# Patient Record
Sex: Male | Born: 1940
Health system: Southern US, Community
[De-identification: ages and names within clinical notes are randomized; demographics above are authoritative.]

## PROBLEM LIST (undated history)

## (undated) DIAGNOSIS — Z87442 Personal history of urinary calculi: Secondary | ICD-10-CM

## (undated) DIAGNOSIS — N200 Calculus of kidney: Secondary | ICD-10-CM

## (undated) DIAGNOSIS — J64 Unspecified pneumoconiosis: Secondary | ICD-10-CM

## (undated) DIAGNOSIS — I2510 Atherosclerotic heart disease of native coronary artery without angina pectoris: Secondary | ICD-10-CM

## (undated) DIAGNOSIS — K219 Gastro-esophageal reflux disease without esophagitis: Secondary | ICD-10-CM

## (undated) DIAGNOSIS — I10 Essential (primary) hypertension: Secondary | ICD-10-CM

## (undated) DIAGNOSIS — D699 Hemorrhagic condition, unspecified: Secondary | ICD-10-CM

## (undated) DIAGNOSIS — G4730 Sleep apnea, unspecified: Secondary | ICD-10-CM

## (undated) DIAGNOSIS — J189 Pneumonia, unspecified organism: Secondary | ICD-10-CM

## (undated) DIAGNOSIS — J449 Chronic obstructive pulmonary disease, unspecified: Secondary | ICD-10-CM

## (undated) DIAGNOSIS — E785 Hyperlipidemia, unspecified: Secondary | ICD-10-CM

## (undated) DIAGNOSIS — M1990 Unspecified osteoarthritis, unspecified site: Secondary | ICD-10-CM

## (undated) HISTORY — PX: CARDIAC CATHETERIZATION: SHX172

## (undated) HISTORY — PX: CYSTOSCOPY W/ STONE MANIPULATION: SHX1427

## (undated) HISTORY — PX: FRACTURE SURGERY: SHX138

## (undated) HISTORY — DX: Chronic obstructive pulmonary disease, unspecified: J44.9

## (undated) HISTORY — DX: Gastro-esophageal reflux disease without esophagitis: K21.9

---

## 1997-02-06 HISTORY — PX: NASAL SEPTUM SURGERY: SHX37

## 2004-02-07 HISTORY — PX: CORONARY ANGIOPLASTY WITH STENT PLACEMENT: SHX49

## 2004-04-27 DIAGNOSIS — I2510 Atherosclerotic heart disease of native coronary artery without angina pectoris: Secondary | ICD-10-CM | POA: Insufficient documentation

## 2004-04-29 ENCOUNTER — Ambulatory Visit: Payer: Self-pay | Admitting: Internal Medicine

## 2004-05-02 ENCOUNTER — Ambulatory Visit: Payer: Self-pay | Admitting: Cardiology

## 2004-05-06 ENCOUNTER — Inpatient Hospital Stay (HOSPITAL_BASED_OUTPATIENT_CLINIC_OR_DEPARTMENT_OTHER): Admission: RE | Admit: 2004-05-06 | Discharge: 2004-05-06 | Payer: Self-pay | Admitting: Cardiology

## 2004-05-06 ENCOUNTER — Ambulatory Visit (HOSPITAL_COMMUNITY): Admission: RE | Admit: 2004-05-06 | Discharge: 2004-05-07 | Payer: Self-pay | Admitting: Cardiology

## 2004-05-06 ENCOUNTER — Ambulatory Visit: Payer: Self-pay | Admitting: Cardiology

## 2004-05-13 ENCOUNTER — Ambulatory Visit: Payer: Self-pay | Admitting: Cardiology

## 2004-06-02 ENCOUNTER — Ambulatory Visit: Payer: Self-pay | Admitting: Cardiology

## 2004-09-02 ENCOUNTER — Ambulatory Visit: Payer: Self-pay | Admitting: Cardiology

## 2004-10-21 ENCOUNTER — Ambulatory Visit: Payer: Self-pay | Admitting: Cardiology

## 2004-11-03 ENCOUNTER — Ambulatory Visit: Payer: Self-pay | Admitting: Cardiology

## 2005-01-23 ENCOUNTER — Ambulatory Visit: Payer: Self-pay | Admitting: Cardiology

## 2005-04-17 ENCOUNTER — Ambulatory Visit: Payer: Self-pay | Admitting: Cardiology

## 2005-06-19 ENCOUNTER — Ambulatory Visit: Payer: Self-pay | Admitting: Cardiology

## 2007-02-07 HISTORY — PX: ANKLE FRACTURE SURGERY: SHX122

## 2010-12-02 DIAGNOSIS — R072 Precordial pain: Secondary | ICD-10-CM

## 2011-04-03 ENCOUNTER — Telehealth: Payer: Self-pay | Admitting: *Deleted

## 2011-04-03 NOTE — Telephone Encounter (Signed)
Opened in ERROR

## 2011-05-26 DIAGNOSIS — N400 Enlarged prostate without lower urinary tract symptoms: Secondary | ICD-10-CM | POA: Insufficient documentation

## 2011-05-26 DIAGNOSIS — I240 Acute coronary thrombosis not resulting in myocardial infarction: Secondary | ICD-10-CM | POA: Insufficient documentation

## 2012-01-24 DIAGNOSIS — J40 Bronchitis, not specified as acute or chronic: Secondary | ICD-10-CM | POA: Insufficient documentation

## 2012-10-17 DIAGNOSIS — D649 Anemia, unspecified: Secondary | ICD-10-CM | POA: Insufficient documentation

## 2014-04-07 ENCOUNTER — Encounter: Payer: Self-pay | Admitting: *Deleted

## 2014-04-07 ENCOUNTER — Ambulatory Visit (INDEPENDENT_AMBULATORY_CARE_PROVIDER_SITE_OTHER): Payer: PPO | Admitting: Cardiology

## 2014-04-07 ENCOUNTER — Encounter: Payer: Self-pay | Admitting: Cardiology

## 2014-04-07 ENCOUNTER — Telehealth: Payer: Self-pay | Admitting: Cardiology

## 2014-04-07 VITALS — BP 116/72 | HR 67 | Ht 69.0 in | Wt 174.8 lb

## 2014-04-07 DIAGNOSIS — R42 Dizziness and giddiness: Secondary | ICD-10-CM

## 2014-04-07 DIAGNOSIS — R030 Elevated blood-pressure reading, without diagnosis of hypertension: Secondary | ICD-10-CM

## 2014-04-07 DIAGNOSIS — I2510 Atherosclerotic heart disease of native coronary artery without angina pectoris: Secondary | ICD-10-CM

## 2014-04-07 DIAGNOSIS — R0789 Other chest pain: Secondary | ICD-10-CM

## 2014-04-07 MED ORDER — NITROGLYCERIN 0.4 MG SL SUBL: 0.4000 mg | SUBLINGUAL_TABLET | SUBLINGUAL | Status: DC | PRN

## 2014-04-07 MED ORDER — RANOLAZINE ER 500 MG PO TB12: 500.0000 mg | ORAL_TABLET | Freq: Two times a day (BID) | ORAL | Status: DC

## 2014-04-07 MED ORDER — LISINOPRIL 2.5 MG PO TABS: 2.5000 mg | ORAL_TABLET | Freq: Every day | ORAL | Status: DC

## 2014-04-07 NOTE — Progress Notes (Signed)
Clinical Summary Eric Benitez is a 74 y.o.maleseen today as a new patient fo the following medical problems.  1. CAD - reports stent placed at [facility] 10 years, do not see records in current electronic record. Reports at the time was having chest and neck pain and ended up having a cath and receiving a stent. No repeat procedures since that time.   - recent chest pain started approx 3 weeks ago. Tightness throughout entire chest, 6/10. +SOB. Can feel dizzy. Can occur at rest, but worst with activity. Not positonal. Can last 3-4 hours consistently. Occurs daily, with recent increase in frequency and some increase in severity. DOE at 1/2 block which is new. Notes some occasional LE edema. Can have some occasional orthopnea. Also with neck pain he states similar to pain he was having 10 years ago prior to stent. . - pcp ordered exercise stress due to chest pain at [facility], he reports he was stopped by technician at the 2 min 40 sec mark when he reached his THR, no ischemic changes.    2. Dizziness - mainly occurs when on his feet - can have palpitations associated.  - bottles of water x 3-4. 2 cups of coffee in AM.  - notes some low bp's at times.     PMH 1. HL 2. BPH 3. HTN 4. CAD    No past medical history on file.   No Known Allergies   Current Outpatient Prescriptions  Medication Sig Dispense Refill  . Ascorbic Acid (VITAMIN C) 1000 MG tablet Take 1,000 mg by mouth daily.    Marland Kitchen aspirin 81 MG tablet Take 81 mg by mouth daily.    Marland Kitchen atorvastatin (LIPITOR) 80 MG tablet Take 80 mg by mouth daily.    . cholecalciferol (VITAMIN D) 1000 UNITS tablet Take 1,000 Units by mouth daily.    Marland Kitchen doxazosin (CARDURA) 8 MG tablet Take 8 mg by mouth daily.    . finasteride (PROSCAR) 5 MG tablet Take 5 mg by mouth daily.    . metoprolol succinate (TOPROL-XL) 25 MG 24 hr tablet Take 25 mg by mouth daily.    . Omega-3 Fatty Acids (FISH OIL PO) Take 1 capsule by mouth daily.    . vitamin E  400 UNIT capsule Take 400 Units by mouth daily.    Marland Kitchen lisinopril (PRINIVIL,ZESTRIL) 2.5 MG tablet Take 1 tablet (2.5 mg total) by mouth daily. 90 tablet 3  . nitroGLYCERIN (NITROSTAT) 0.4 MG SL tablet Place 1 tablet (0.4 mg total) under the tongue every 5 (five) minutes as needed for chest pain. 90 tablet 3  . ranolazine (RANEXA) 500 MG 12 hr tablet Take 1 tablet (500 mg total) by mouth 2 (two) times daily. 60 tablet 3   No current facility-administered medications for this visit.     No past surgical history on file.   No Known Allergies    No family history on file.   Social History Mr. Colgate reports that he quit smoking about 35 years ago. He does not have any smokeless tobacco history on file. Mr. Helmers has no alcohol history on file.   Review of Systems CONSTITUTIONAL: No weight loss, fever, chills, weakness or fatigue.  HEENT: Eyes: No visual loss, blurred vision, double vision or yellow sclerae.No hearing loss, sneezing, congestion, runny nose or sore throat.  SKIN: No rash or itching.  CARDIOVASCULAR: per HPI RESPIRATORY: No shortness of breath, cough or sputum.  GASTROINTESTINAL: No anorexia, nausea, vomiting or diarrhea.  No abdominal pain or blood.  GENITOURINARY: No burning on urination, no polyuria NEUROLOGICAL: No headache, dizziness, syncope, paralysis, ataxia, numbness or tingling in the extremities. No change in bowel or bladder control.  MUSCULOSKELETAL:neck pain  LYMPHATICS: No enlarged nodes. No history of splenectomy.  PSYCHIATRIC: No history of depression or anxiety.  ENDOCRINOLOGIC: No reports of sweating, cold or heat intolerance. No polyuria or polydipsia.  Marland Kitchen   Physical Examination Filed Vitals:   04/07/14 1323  BP: 116/72  Pulse: 67   Filed Weights   04/07/14 1323  Weight: 174 lb 12.8 oz (79.289 kg)    Gen: resting comfortably, no acute distress HEENT: no scleral icterus, pupils equal round and reactive, no palptable cervical adenopathy,    CV: RRR, no m/r/g, no JVD Resp: Clear to auscultation bilaterally GI: abdomen is soft, non-tender, non-distended, normal bowel sounds, no hepatosplenomegaly MSK: extremities are warm, no edema.  Skin: warm, no rash Neuro:  no focal deficits Psych: appropriate affect    Assessment and Plan  1. CAD - hx of stent 10 years ago,  recent episodes of chest pain concerning for possible ischemia. - given known history and description of symptoms high pretest probability for obstructive CAD that would not be sufficiently decreased with non-invasive testing. Will refer for LHC.  - due to orthostatic symptoms, start ranexa 500mg  bid as additional antianginal  2. Dizziness - symptoms consistent with orthostatic dizziness and note borderline low bp's at home, however he is not orthostatic in clinic today.  - will decrease lisinopril to 2.5mg  daily, encouraged increased oral fluid intake.    F/u 3-4 weeks.   Arnoldo Lenis, M.D.

## 2014-04-07 NOTE — Telephone Encounter (Signed)
Patient has [employer] -checking per cert for catheterization set for March 4th with Dr. Ellyn Hack

## 2014-04-07 NOTE — Patient Instructions (Addendum)
Your physician recommends that you schedule a follow-up appointment in: 3-4 WEEKS WITH DR. BRANCH  Your physician has recommended you make the following change in your medication:   DECREASE LISINOPRIL 2.5 MG DAILY  START NITROGLYCERIN AS DIRECTED AS NEEDED  START RANEXA 500 MG TWICE DAILY  Your physician has requested that you have a cardiac catheterization. Cardiac catheterization is used to diagnose and/or treat various heart conditions. Doctors may recommend this procedure for a number of different reasons. The most common reason is to evaluate chest pain. Chest pain can be a symptom of coronary artery disease (CAD), and cardiac catheterization can show whether plaque is narrowing or blocking your heart's arteries. This procedure is also used to evaluate the valves, as well as measure the blood flow and oxygen levels in different parts of your heart. For further information please visit [email or web address]. Please follow instruction sheet, as given.   WE WILL REQUEST LABS FROM DR. DANIEL  Thank you for choosing [facility]!!

## 2014-04-08 ENCOUNTER — Other Ambulatory Visit: Payer: Self-pay | Admitting: Cardiology

## 2014-04-08 DIAGNOSIS — R0789 Other chest pain: Secondary | ICD-10-CM

## 2014-04-09 ENCOUNTER — Encounter (HOSPITAL_COMMUNITY): Payer: Self-pay | Admitting: *Deleted

## 2014-04-09 ENCOUNTER — Telehealth: Payer: Self-pay | Admitting: *Deleted

## 2014-04-09 ENCOUNTER — Inpatient Hospital Stay (HOSPITAL_COMMUNITY)
Admission: EM | Admit: 2014-04-09 | Discharge: 2014-04-10 | DRG: 287 | Disposition: A | Discharge status: Home / Self Care | Payer: PPO | Attending: Cardiology | Admitting: Cardiology

## 2014-04-09 ENCOUNTER — Encounter (HOSPITAL_COMMUNITY)
Admission: EM | Disposition: A | Discharge status: Home / Self Care | Payer: Self-pay | Source: Home / Self Care | Attending: Cardiology

## 2014-04-09 ENCOUNTER — Emergency Department (HOSPITAL_COMMUNITY): Payer: PPO

## 2014-04-09 ENCOUNTER — Other Ambulatory Visit: Payer: Self-pay

## 2014-04-09 DIAGNOSIS — E785 Hyperlipidemia, unspecified: Secondary | ICD-10-CM | POA: Diagnosis present

## 2014-04-09 DIAGNOSIS — Z79899 Other long term (current) drug therapy: Secondary | ICD-10-CM | POA: Diagnosis not present

## 2014-04-09 DIAGNOSIS — Z7982 Long term (current) use of aspirin: Secondary | ICD-10-CM

## 2014-04-09 DIAGNOSIS — I25110 Atherosclerotic heart disease of native coronary artery with unstable angina pectoris: Principal | ICD-10-CM | POA: Diagnosis present

## 2014-04-09 DIAGNOSIS — Z87891 Personal history of nicotine dependence: Secondary | ICD-10-CM

## 2014-04-09 DIAGNOSIS — Z9861 Coronary angioplasty status: Secondary | ICD-10-CM

## 2014-04-09 DIAGNOSIS — R55 Syncope and collapse: Secondary | ICD-10-CM | POA: Diagnosis present

## 2014-04-09 DIAGNOSIS — Z955 Presence of coronary angioplasty implant and graft: Secondary | ICD-10-CM | POA: Diagnosis not present

## 2014-04-09 DIAGNOSIS — I10 Essential (primary) hypertension: Secondary | ICD-10-CM | POA: Diagnosis present

## 2014-04-09 DIAGNOSIS — I200 Unstable angina: Secondary | ICD-10-CM | POA: Diagnosis present

## 2014-04-09 DIAGNOSIS — I2510 Atherosclerotic heart disease of native coronary artery without angina pectoris: Secondary | ICD-10-CM

## 2014-04-09 DIAGNOSIS — R079 Chest pain, unspecified: Secondary | ICD-10-CM | POA: Diagnosis present

## 2014-04-09 HISTORY — PX: LEFT HEART CATHETERIZATION WITH CORONARY ANGIOGRAM: SHX5451

## 2014-04-09 HISTORY — DX: Pneumonia, unspecified organism: J18.9

## 2014-04-09 HISTORY — DX: Calculus of kidney: N20.0

## 2014-04-09 HISTORY — DX: Unspecified osteoarthritis, unspecified site: M19.90

## 2014-04-09 HISTORY — DX: Essential (primary) hypertension: I10

## 2014-04-09 HISTORY — DX: Hyperlipidemia, unspecified: E78.5

## 2014-04-09 HISTORY — DX: Atherosclerotic heart disease of native coronary artery without angina pectoris: I25.10

## 2014-04-09 HISTORY — DX: Hemorrhagic condition, unspecified: D69.9

## 2014-04-09 LAB — BASIC METABOLIC PANEL
Anion gap: 6 (ref 5–15)
BUN: 9 mg/dL (ref 6–23)
CHLORIDE: 104 mmol/L (ref 96–112)
CO2: 26 mmol/L (ref 19–32)
Calcium: 9.1 mg/dL (ref 8.4–10.5)
Creatinine, Ser: 1.07 mg/dL (ref 0.50–1.35)
GFR calc non Af Amer: 67 mL/min — ABNORMAL LOW (ref >90)
GFR, EST AFRICAN AMERICAN: 77 mL/min — AB (ref >90)
GLUCOSE: 105 mg/dL — AB (ref 70–99)
Potassium: 4.5 mmol/L (ref 3.5–5.1)
SODIUM: 136 mmol/L (ref 135–145)

## 2014-04-09 LAB — I-STAT TROPONIN, ED
TROPONIN I, POC: 0 ng/mL (ref 0.00–0.08)
Troponin i, poc: 0 ng/mL (ref 0.00–0.08)

## 2014-04-09 LAB — CBC
HCT: 36.5 % — ABNORMAL LOW (ref 39.0–52.0)
Hemoglobin: 12.2 g/dL — ABNORMAL LOW (ref 13.0–17.0)
MCH: 31.3 pg (ref 26.0–34.0)
MCHC: 33.4 g/dL (ref 30.0–36.0)
MCV: 93.6 fL (ref 78.0–100.0)
Platelets: 170 10*3/uL (ref 150–400)
RBC: 3.9 MIL/uL — AB (ref 4.22–5.81)
RDW: 13.8 % (ref 11.5–15.5)
WBC: 5.3 10*3/uL (ref 4.0–10.5)

## 2014-04-09 LAB — APTT: APTT: 27 s (ref 24–37)

## 2014-04-09 LAB — PROTIME-INR
INR: 1.07 (ref 0.00–1.49)
Prothrombin Time: 14 seconds (ref 11.6–15.2)

## 2014-04-09 SURGERY — LEFT HEART CATHETERIZATION WITH CORONARY ANGIOGRAM

## 2014-04-09 MED ORDER — MORPHINE SULFATE 4 MG/ML IJ SOLN
4.0000 mg | Freq: Once | INTRAMUSCULAR | Status: AC
  Administered 2014-04-09: 4 mg via INTRAVENOUS
  Filled 2014-04-09: qty 1

## 2014-04-09 MED ORDER — LIDOCAINE HCL (PF) 1 % IJ SOLN
INTRAMUSCULAR | Status: AC
Start: 2014-04-09 — End: 2014-04-09
  Filled 2014-04-09: qty 30

## 2014-04-09 MED ORDER — FINASTERIDE 5 MG PO TABS
5.0000 mg | ORAL_TABLET | Freq: Every day | ORAL | Status: DC
Start: 2014-04-10 — End: 2014-04-10
  Administered 2014-04-10: 5 mg via ORAL
  Filled 2014-04-09: qty 1

## 2014-04-09 MED ORDER — ASPIRIN EC 81 MG PO TBEC
81.0000 mg | DELAYED_RELEASE_TABLET | Freq: Every day | ORAL | Status: DC
  Administered 2014-04-10: 81 mg via ORAL
  Filled 2014-04-09: qty 1

## 2014-04-09 MED ORDER — HEPARIN (PORCINE) IN NACL 2-0.9 UNIT/ML-% IJ SOLN
INTRAMUSCULAR | Status: AC
  Filled 2014-04-09: qty 1500

## 2014-04-09 MED ORDER — LISINOPRIL 2.5 MG PO TABS
2.5000 mg | ORAL_TABLET | Freq: Every day | ORAL | Status: DC
Start: 2014-04-10 — End: 2014-04-10
  Administered 2014-04-10: 2.5 mg via ORAL
  Filled 2014-04-09: qty 1

## 2014-04-09 MED ORDER — SODIUM CHLORIDE 0.9 % IV SOLN
1.0000 mL/kg/h | INTRAVENOUS | Status: AC
  Administered 2014-04-09: 1 mL/kg/h via INTRAVENOUS

## 2014-04-09 MED ORDER — SODIUM CHLORIDE 0.9 % IJ SOLN: 3.0000 mL | INTRAMUSCULAR | Status: DC | PRN

## 2014-04-09 MED ORDER — ONDANSETRON HCL 4 MG/2ML IJ SOLN: 4.0000 mg | Freq: Four times a day (QID) | INTRAMUSCULAR | Status: DC | PRN

## 2014-04-09 MED ORDER — NITROGLYCERIN 1 MG/10 ML FOR IR/CATH LAB
INTRA_ARTERIAL | Status: AC
  Filled 2014-04-09: qty 10

## 2014-04-09 MED ORDER — HEPARIN BOLUS VIA INFUSION
4000.0000 [IU] | Freq: Once | INTRAVENOUS | Status: AC
  Administered 2014-04-09: 4000 [IU] via INTRAVENOUS
  Filled 2014-04-09: qty 4000

## 2014-04-09 MED ORDER — MIDAZOLAM HCL 2 MG/2ML IJ SOLN
INTRAMUSCULAR | Status: AC
Start: 2014-04-09 — End: 2014-04-09
  Filled 2014-04-09: qty 2

## 2014-04-09 MED ORDER — HEPARIN (PORCINE) IN NACL 100-0.45 UNIT/ML-% IJ SOLN
1100.0000 [IU]/h | INTRAMUSCULAR | Status: DC
  Administered 2014-04-10: 1100 [IU]/h via INTRAVENOUS
  Filled 2014-04-09: qty 250

## 2014-04-09 MED ORDER — VITAMIN E 180 MG (400 UNIT) PO CAPS
400.0000 [IU] | ORAL_CAPSULE | Freq: Every day | ORAL | Status: DC
  Administered 2014-04-10: 400 [IU] via ORAL
  Filled 2014-04-09: qty 1

## 2014-04-09 MED ORDER — HEPARIN (PORCINE) IN NACL 100-0.45 UNIT/ML-% IJ SOLN
1100.0000 [IU]/h | INTRAMUSCULAR | Status: DC
  Administered 2014-04-09: 1100 [IU]/h via INTRAVENOUS
  Filled 2014-04-09: qty 250

## 2014-04-09 MED ORDER — VITAMIN D 1000 UNITS PO TABS
1000.0000 [IU] | ORAL_TABLET | Freq: Every day | ORAL | Status: DC
Start: 2014-04-10 — End: 2014-04-10
  Administered 2014-04-10: 1000 [IU] via ORAL
  Filled 2014-04-09: qty 1

## 2014-04-09 MED ORDER — SODIUM CHLORIDE 0.9 % IV SOLN: 250.0000 mL | INTRAVENOUS | Status: DC | PRN

## 2014-04-09 MED ORDER — FENTANYL CITRATE 0.05 MG/ML IJ SOLN
INTRAMUSCULAR | Status: AC
  Filled 2014-04-09: qty 2

## 2014-04-09 MED ORDER — ACETAMINOPHEN 325 MG PO TABS: 650.0000 mg | ORAL_TABLET | ORAL | Status: DC | PRN

## 2014-04-09 MED ORDER — SODIUM CHLORIDE 0.9 % IJ SOLN
3.0000 mL | Freq: Two times a day (BID) | INTRAMUSCULAR | Status: DC
  Administered 2014-04-10: 3 mL via INTRAVENOUS

## 2014-04-09 MED ORDER — DOXAZOSIN MESYLATE 8 MG PO TABS
8.0000 mg | ORAL_TABLET | Freq: Every day | ORAL | Status: DC
  Administered 2014-04-10: 8 mg via ORAL
  Filled 2014-04-09 (×2): qty 1

## 2014-04-09 MED ORDER — NITROGLYCERIN 0.4 MG SL SUBL: 0.4000 mg | SUBLINGUAL_TABLET | SUBLINGUAL | Status: DC | PRN

## 2014-04-09 MED ORDER — ATORVASTATIN CALCIUM 80 MG PO TABS
80.0000 mg | ORAL_TABLET | Freq: Every day | ORAL | Status: DC
  Administered 2014-04-09 – 2014-04-10 (×2): 80 mg via ORAL
  Filled 2014-04-09 (×2): qty 1

## 2014-04-09 MED ORDER — RANOLAZINE ER 500 MG PO TB12
500.0000 mg | ORAL_TABLET | Freq: Two times a day (BID) | ORAL | Status: DC
  Administered 2014-04-09 – 2014-04-10 (×2): 500 mg via ORAL
  Filled 2014-04-09 (×2): qty 1

## 2014-04-09 MED ORDER — ASPIRIN 81 MG PO CHEW
324.0000 mg | CHEWABLE_TABLET | Freq: Once | ORAL | Status: AC
  Administered 2014-04-09: 324 mg via ORAL
  Filled 2014-04-09: qty 4

## 2014-04-09 MED ORDER — NITROGLYCERIN 2 % TD OINT
1.0000 [in_us] | TOPICAL_OINTMENT | Freq: Once | TRANSDERMAL | Status: AC
  Administered 2014-04-09: 1 [in_us] via TOPICAL
  Filled 2014-04-09: qty 1

## 2014-04-09 MED ORDER — ASPIRIN 81 MG PO TABS: 81.0000 mg | ORAL_TABLET | Freq: Every day | ORAL | Status: DC

## 2014-04-09 MED ORDER — METOPROLOL SUCCINATE ER 25 MG PO TB24
25.0000 mg | ORAL_TABLET | Freq: Every day | ORAL | Status: DC
  Administered 2014-04-10: 25 mg via ORAL
  Filled 2014-04-09: qty 1

## 2014-04-09 MED ORDER — SODIUM CHLORIDE 0.9 % IV SOLN
1000.0000 mL | INTRAVENOUS | Status: DC
  Administered 2014-04-09: 1000 mL via INTRAVENOUS

## 2014-04-09 NOTE — Consult Note (Signed)
CARDIOLOGY CONSULT NOTE  Patient ID: Eric Benitez, MRN: [id number], DOB/AGE: Mar 05, 1940 74 y.o. Admit date: 04/09/2014 Date of Consult: 04/09/2014  Primary Physician: Gar Ponto, MD Primary Cardiologist: Not followed currently  Referring Physician: ED Physician   Chief Complaint: Chest pain  Reason for Consultation: Chest pain   HPI:  Eric Benitez is a 74yo man with PMHx of HTN, hyperlipidemia, and CAD s/p stent in 2006 who presents to the ED with worsening chest pain. Patient reports approximately 3 weeks ago he started to have intermittent chest pain while working in the yard and walking. He states the pain would resolve with nitro and/or rest. He then notes the chest pain has been worsening over the last few days. He describes the chest pain as left-sided, 8/10 in severity, non-radiating, tight and pressure-like, and associated with dyspnea and palpitations. He denies associated diaphoresis and nausea/vomiting. He states his most recent episode of chest pain was this morning while he was sitting down and after he drank 2 cups of coffee. He notes the pain improved after 2 nitro, but did not resolve as usual. He reports he had a stress test last Thursday at [facility] that was "normal." He also notes this Tuesday he was working in the yard when he suddenly became dizzy, "swimmy-headed", short of breath, and felt he was going to pass out. He denies losing consciousness. He denies previous history of stroke/TIA. He denies changes in vision, weakness, changes in sensation, or slurred speech.   Medical History:  Past Medical History  Diagnosis Date  . CAD (coronary artery disease)   . Hyperlipemia   . Hypertension       Surgical History:  Past Surgical History  Procedure Laterality Date  . Coronary angioplasty with stent placement    . Kidney stone surgery       Home Meds: Prior to Admission medications   Medication Sig Start Date End Date Taking? Authorizing Provider    Ascorbic Acid (VITAMIN C) 1000 MG tablet Take 1,000 mg by mouth daily.   Yes Historical Provider, MD  aspirin 81 MG tablet Take 81 mg by mouth daily.   Yes Historical Provider, MD  atorvastatin (LIPITOR) 80 MG tablet Take 80 mg by mouth daily.   Yes Historical Provider, MD  cholecalciferol (VITAMIN D) 1000 UNITS tablet Take 1,000 Units by mouth daily.   Yes Historical Provider, MD  doxazosin (CARDURA) 8 MG tablet Take 8 mg by mouth daily.   Yes Historical Provider, MD  finasteride (PROSCAR) 5 MG tablet Take 5 mg by mouth daily.   Yes Historical Provider, MD  lisinopril (PRINIVIL,ZESTRIL) 2.5 MG tablet Take 1 tablet (2.5 mg total) by mouth daily. 04/07/14  Yes Arnoldo Lenis, MD  metoprolol succinate (TOPROL-XL) 25 MG 24 hr tablet Take 25 mg by mouth daily.   Yes Historical Provider, MD  nitroGLYCERIN (NITROSTAT) 0.4 MG SL tablet Place 1 tablet (0.4 mg total) under the tongue every 5 (five) minutes as needed for chest pain. 04/07/14  Yes Arnoldo Lenis, MD  Omega-3 Fatty Acids (FISH OIL PO) Take 1 capsule by mouth daily.   Yes Historical Provider, MD  ranolazine (RANEXA) 500 MG 12 hr tablet Take 1 tablet (500 mg total) by mouth 2 (two) times daily. 04/07/14  Yes Arnoldo Lenis, MD  vitamin E 400 UNIT capsule Take 400 Units by mouth daily.   Yes Historical Provider, MD    Inpatient Medications:    . sodium chloride 1,000 mL (04/09/14 1057)  Allergies: No Known Allergies  History   Social History  . Marital Status: Married    Spouse Name: N/A  . Number of Children: N/A  . Years of Education: N/A   Occupational History  . Not on file.   Social History Main Topics  . Smoking status: Former Smoker    Quit date: 02/07/1979  . Smokeless tobacco: Not on file  . Alcohol Use: No  . Drug Use: No  . Sexual Activity: Not on file   Other Topics Concern  . Not on file   Social History Narrative     No family history on file.   Review of Systems: General: negative for chills,  fever, night sweats or weight changes.  ENT: negative for rhinorrhea or epistaxis Cardiovascular: See HPI Dermatological: negative for rash Respiratory: negative for cough or wheezing GI: negative for diarrhea, bright red blood per rectum, melena, or hematemesis GU: no hematuria, urgency, or frequency Neurologic: See HPI Heme: no easy bruising or bleeding Endo: negative for excessive thirst, thyroid disorder, or flushing Musculoskeletal: negative for joint pain or swelling, negative for myalgias  All other systems reviewed and are otherwise negative except as noted above.  Physical Exam: Blood pressure 105/66, pulse 56, temperature 97.9 F (36.6 C), resp. rate 14, height 5\' 9"  (1.753 m), weight 174 lb (78.926 kg), SpO2 96 %. General: alert, sitting up in bed, NAD HEENT: [place]/AT, EOMI, mucus membranes moist CV: RRR,  No m/g/r Pulm: CTA bilterally, breaths non-labored Abd: BS+, soft, non-tender Ext: warm, no edema, moves all Neuro: alert and oriented x 3, no focal deficits     Labs: No results for input(s): CKTOTAL, CKMB, TROPONINI in the last 72 hours. Lab Results  Component Value Date   WBC 5.3 04/09/2014   HGB 12.2* 04/09/2014   HCT 36.5* 04/09/2014   MCV 93.6 04/09/2014   PLT 170 04/09/2014    Recent Labs Lab 04/09/14 1028  NA 136  K 4.5  CL 104  CO2 26  BUN 9  CREATININE 1.07  CALCIUM 9.1  GLUCOSE 105*   No results found for: CHOL, HDL, LDLCALC, TRIG No results found for: DDIMER  Radiology/Studies:  Dg Chest Port 1 View  04/09/2014   CLINICAL DATA:  Three-week history of difficulty breathing and chest pain  EXAM: PORTABLE CHEST - 1 VIEW  COMPARISON:  March 24, 2014  FINDINGS: There is underlying emphysematous change. There are a few small scattered granulomas. There is no edema or consolidation. Heart is upper normal in size with pulmonary vascularity within normal limits. No adenopathy. No bone lesions.  IMPRESSION: Underlying emphysematous change. No edema or  consolidation. Scattered small granulomas.   Electronically Signed   By: Lowella Grip III M.D.   On: 04/09/2014 10:36    EKG:  Sinus rhythm, no evidence of ischemia   Cardiac Studies: None  ASSESSMENT AND PLAN:   Unstable Angina: Patient presented with 3 week history of exertional chest pain that was relieved with nitro and rest consistent with stable angina and then had sudden chest pain at rest this morning concerning for unstable angina. He does have a history of CAD with prior stenting. His TIMI score is 3-4. Patient had a stress test last Thursday that was reportedly normal, but he was only able to complete a few minutes of the test due to dyspnea. He was scheduled to undergo cardiac cath tomorrow. Troponin negative x 1 and EKG without acute ischemic changes. Given the acuity of his pain and that it  is occurring at rest, will proceed with cardiac catheterization this afternoon.  - Start heparin gtt - Cardiac cath this afternoon  - Cardiac monitoring   Signed, Albin Felling MD 04/09/2014, 1:42 PM   Patient seen, examined. Available data reviewed. Agree with findings, assessment, and plan as outlined by Dr Arcelia Jew. Pt's exam shows an alert, oriented male in NAD. Lungs CTA, CV RRR without murmur, extremities without edema. EKG shows no acute changes. Pt was scheduled for cardiac cath tomorrow, but now has accelerating angina and rest pain. He has been started on NTG and IV heparin. Plan for urgent cardiac cath and possible PCI.   I have reviewed the risks, indications, and alternatives to cardiac catheterization and PTCA/stetning with the patient. Risks include but are not limited to bleeding, infection, vascular injury, stroke, myocardial infection, arrhythmia, kidney injury, radiation-related injury in the case of prolonged fluoroscopy use, emergency cardiac surgery, and death. The patient understands the risks of serious complication is low (<4%).   Sherren Mocha, M.D. 04/09/2014 2:56  PM

## 2014-04-09 NOTE — Progress Notes (Signed)
PHARMACY NOTE  Pharmacy Consult :  74 y.o. male s/p Cardiac Cath.  Heparin is to be restarted due to possibility of his chest pain resulting from a PE.   Dosing Wt :  71 kg  Hematology :  04/09/14 1028  HGB 12.2*  HCT 36.5*  PLT 170  APTT 27  LABPROT 14.0  INR 1.07  CREATININE 1.07    Current Medication[s] Include: Scheduled:  Scheduled:  . [START ON 04/10/2014] aspirin EC  81 mg Oral Daily  . atorvastatin  80 mg Oral Daily  . [START ON 04/10/2014] cholecalciferol  1,000 Units Oral Daily  . doxazosin  8 mg Oral Daily  . [START ON 04/10/2014] finasteride  5 mg Oral Daily  . [START ON 04/10/2014] lisinopril  2.5 mg Oral Daily  . metoprolol succinate  25 mg Oral Daily  . ranolazine  500 mg Oral BID  . [START ON 04/10/2014] sodium chloride  3 mL Intravenous Q12H  . [START ON 04/10/2014] vitamin E  400 Units Oral Daily   Infusion[s]: Infusions:  . sodium chloride 1,000 mL (04/09/14 1057)  . Heparin to be restarted 6 hrs after TR Band removal 1,100 Units/hr      Assessment :  S/P Cardiac Cath.  Cath not significant for CAD as per note.  Heparin to be restarted due to concern for PE.  Goal :  Heparin level 0.3-0.7 units/ml.  Plan : 1. Heparin will be restarted at same rate, 1100 units/hr six hours after TR band removal [8 hrs after Sheath removal]..   The next Heparin Level 8 hours after restart. 2. Daily Heparin level, CBC while on Heparin.  Monitor for bleeding complications. Follow Platelet counts.  Kendrix Orman, Craig Guess,  Pharm.D  04/09/2014  7:30 PM

## 2014-04-09 NOTE — Consult Note (Signed)
ANTICOAGULATION CONSULT NOTE - Initial Consult  Pharmacy Consult for Heparin Indication: chest pain/ACS  No Known Allergies  Patient Measurements: Height: 5\' 9"  (175.3 cm) Weight: 174 lb (78.926 kg) IBW/kg (Calculated) : 70.7  Vital Signs: Temp: 97.9 F (36.6 C) (03/03 1013) BP: 116/68 mmHg (03/03 1415) Pulse Rate: 55 (03/03 1415)  Labs:  Recent Labs  04/09/14 1028  HGB 12.2*  HCT 36.5*  PLT 170  APTT 27  LABPROT 14.0  INR 1.07  CREATININE 1.07    Estimated Creatinine Clearance: 61.5 mL/min (by C-G formula based on Cr of 1.07).   Medical History: Past Medical History  Diagnosis Date  . CAD (coronary artery disease)   . Hyperlipemia   . Hypertension     Medications:  No anticoagulants pta  Assessment: Eric Benitez with hx CAD s/p stent in 2006 presents to the ED with worsening chest pain over the last few days. He will begin heparin with plans for cath tomorrow. Baseline labs wnl.   Goal of Therapy:  Heparin level 0.3-0.7 units/ml Monitor platelets by anticoagulation protocol: Yes   Plan:  1) Heparin bolus 4000 units x 1 2) Heparin drip at 1100 units/hr 3) Check 8 hour heparin level 4) Daily heparin level and CBC  Deboraha Sprang 04/09/2014,2:44 PM

## 2014-04-09 NOTE — ED Notes (Signed)
Pts belongings given to wife at bedside, clothing, socks and gold watch.

## 2014-04-09 NOTE — ED Notes (Signed)
Patient started ranexa 500mg  bid 2 days ago

## 2014-04-09 NOTE — CV Procedure (Signed)
 CARDIAC CATHETERIZATION REPORT  NAME:  Eric Benitez   MRN: [id number] DOB:  11/03/1940   ADMIT DATE: 04/09/2014 Procedure Date: 04/09/2014  INTERVENTIONAL CARDIOLOGIST:  W , M.D., MS PRIMARY CARE PROVIDER: DANIEL, TERRY, MD PRIMARY CARDIOLOGIST: Jonathan Branch, M.D.  PATIENT:  Eric Benitez is a 73 y.o. male with a history of single-vessel CAD status post PCI in March 2006 (he took Plavix for long period time, son and presuming that it was likely a DES stent). At that time his anginal pain was no chest pain. Over the last 3 weeks she's noticed recent onset chest pain and wrote tightness. The tightness also spans across the entire chest and 6 at 10. He also notes dyspnea. He can last 34 hours and is not positional. Has increased with intensity. He also notes exertional dyspnea walking about half a block which is also new.  He apparently had a stress test done last week at [facility] that was "normal ". He was initially planned to undergo cardiac catheterization tomorrow on 04/11/2014, however he presented emergency room at [facility] with dizziness and near syncope type symptoms. He also had significant chest pain this morning after drinking 2 cups of coffee. It did not resolve, and therefore he came to the emergency room. He was seen by Dr. Cooper who was concerned based on Dr. branches initial assessment earlier this week clinic visit that these symptoms were consistent with unstable angina. He is therefore referred for invasive evaluation and urgent matter based on his early presentation.  PRE-OPERATIVE DIAGNOSIS:    Unstable Angina  Known CAD status post PCI to the mid LAD with DES in March 2006  PROCEDURES PERFORMED:    Left Heart Catheterization with Native Coronary Angiography  via Right Radial Artery   Left Ventriculography  PROCEDURE: The patient was brought to the 2nd Floor [facility] in the fasting state  and prepped and draped in the usual sterile fashion for Right Radial artery access. A modified Allen's test was performed on the right wrist demonstrating excellent collateral flow for radial access.   Sterile technique was used including antiseptics, cap, gloves, gown, hand hygiene, mask and sheet. Skin prep: Chlorhexidine.   Consent: Risks of procedure as well as the alternatives and risks of each were explained to the (patient/caregiver). Consent for procedure obtained.   Time Out: Verified patient identification, verified procedure, site/side was marked, verified correct patient position, special equipment/implants available, medications/allergies/relevent history reviewed, required imaging and test results available. Performed.  Access:   Right Radial Artery: 6 Fr Sheath -  Seldinger Technique (Angiocath Micropuncture Kit)  Radial Cocktail - 10 mL;  the patient had just received 4000 units IV Heparin in the ER  Left Heart Catheterization: 5 Fr Catheters advanced or exchanged over a long exchange safety J-wire; TIG 4.0 catheter advanced first.  Left & Right Coronary Artery Cineangiography: TIG 4.0 Catheter   LV Hemodynamics (LV Gram): Angled Pigtail Catheter  Sheath removed in the Cardiac Cath Lab with TR band placement for hemostasis.  TR Band: 1835  Hours; 15 mL air  FINDINGS:  Hemodynamics:   Central Aortic Pressure / Mean: 126/67/92 mmHg  Left Ventricular Pressure / LVEDP: 128/10/16 mmHg  Left Ventriculography:  EF: 50-65 %  Wall Motion: Normal to hyperdynamic  Coronary Anatomy:  Dominance: Co-dominant  Left Main: Large-caliber vessel that is short. Bifurcates into the LAD and Circumflex. Angiographically normal. LAD: Large-caliber vessel with a very large proximal D1 branch that comes   off just prior to a widely patent stent in the early mid vessel. There is a small second diagonal branch that arises from the stented segment and another small third diagonal branch more  distally. The distal vessel tapers into a moderate caliber vessel that wraps around the apex perfusing the distal third of the inferoapex.  D1: Large-caliber proximal branch with ostial 40-50% focal stenosis. The vessel then courses almost of the ramus intermedius. The vessel is very tortuous gives rise to several mold-moderate caliber branches distally. Beyond the ostial lesion, the vessel remains angiographically normal.  Left Circumflex: Moderate large-caliber, codominant vessel. There is a small moderate caliber first obtuse marginal branch before the vessel courses into the AV groove. In the mid AV groove there is a hairpin turn as the vessel courses distally to provide to posterior lateral branches with the first being moderate caliber and second mean small caliber.  OM1: Small moderate caliber branch that does not cover large distribution. Tortuous but free of disease.   RCA: Moderate large-caliber, codominant vessel. Minimal luminal irregularities are vessel tapers down to [place] more moderate caliber right posterior descending arteries. There is a very small posterior lateral system.  After reviewing the initial angiography, no culprit lesion was identified.    MEDICATIONS:  Anesthesia:  Local Lidocaine 2 ml  Sedation:  1 mg IV Versed, 50 mcg IV fentanyl ;   Premedication: 4000 Units IV Heparin, 325 mg ASA  Omnipaque Contrast: 80 ml Radial Cocktail: 5 mg Verapamil, 400 mcg NTG, 2 ml 2% Lidocaine in 10 ml NS  PATIENT DISPOSITION:    The patient was transferred to the PACU holding area in a hemodynamicaly stable, chest pain free condition.  The patient tolerated the procedure well, and there were no complications.  EBL:   < 5 ml  The patient was stable before, during, and after the procedure.  POST-OPERATIVE DIAGNOSIS:    Essentially Angiographic normal coronary arteries with widely patent mid LAD stent after D2. Only moderate 40-50% ostial D1 disease.  Normal LVEF with mildly  elevated EDP  PLAN OF CARE:  Transferred to nursing unit to complete evaluation for noncardiac etiology for his chest pain.  As we are not clear if he potentially has pulmonary emboli, would continue IV heparin post TR band removal. Would defer to primary team to determine additional care.   Leonie Man, M.D., M.S. Interventional Cardiologist   Pager # [phone]

## 2014-04-09 NOTE — ED Notes (Signed)
Patient with onset of chest pain at 0900.  He has taken nitro x 3 today w/o relief.  He is scheduled to have a cath tomorrow.  His pain has been intermittent for 3 weeks and he has seen his MD for same.  He has hx of stent.  Patient is alert.  He states he is feeling a little dizzy as well.  States his sx prior to stent were different.  This is a burning pain all over chest

## 2014-04-09 NOTE — Telephone Encounter (Signed)
Wife called to inform Dr. Harl Bowie that patient's chest pain has increased and will not go away. Patient is currently in route to [facility] ED for an evaluation. Patient is scheduled to have an outpatient cardiac cath on tomorrow.

## 2014-04-09 NOTE — H&P (View-Only) (Signed)
CARDIOLOGY CONSULT NOTE  Patient ID: Eric Benitez, MRN: [id number], DOB/AGE: September 23, 1940 74 y.o. Admit date: 04/09/2014 Date of Consult: 04/09/2014  Primary Physician: Gar Ponto, MD Primary Cardiologist: Not followed currently  Referring Physician: ED Physician   Chief Complaint: Chest pain  Reason for Consultation: Chest pain   HPI:  Eric Benitez is a 74yo man with PMHx of HTN, hyperlipidemia, and CAD s/p stent in 2006 who presents to the ED with worsening chest pain. Patient reports approximately 3 weeks ago he started to have intermittent chest pain while working in the yard and walking. He states the pain would resolve with nitro and/or rest. He then notes the chest pain has been worsening over the last few days. He describes the chest pain as left-sided, 8/10 in severity, non-radiating, tight and pressure-like, and associated with dyspnea and palpitations. He denies associated diaphoresis and nausea/vomiting. He states his most recent episode of chest pain was this morning while he was sitting down and after he drank 2 cups of coffee. He notes the pain improved after 2 nitro, but did not resolve as usual. He reports he had a stress test last Thursday at [facility] that was "normal." He also notes this Tuesday he was working in the yard when he suddenly became dizzy, "swimmy-headed", short of breath, and felt he was going to pass out. He denies losing consciousness. He denies previous history of stroke/TIA. He denies changes in vision, weakness, changes in sensation, or slurred speech.   Medical History:  Past Medical History  Diagnosis Date  . CAD (coronary artery disease)   . Hyperlipemia   . Hypertension       Surgical History:  Past Surgical History  Procedure Laterality Date  . Coronary angioplasty with stent placement    . Kidney stone surgery       Home Meds: Prior to Admission medications   Medication Sig Start Date End Date Taking? Authorizing Provider    Ascorbic Acid (VITAMIN C) 1000 MG tablet Take 1,000 mg by mouth daily.   Yes Historical Provider, MD  aspirin 81 MG tablet Take 81 mg by mouth daily.   Yes Historical Provider, MD  atorvastatin (LIPITOR) 80 MG tablet Take 80 mg by mouth daily.   Yes Historical Provider, MD  cholecalciferol (VITAMIN D) 1000 UNITS tablet Take 1,000 Units by mouth daily.   Yes Historical Provider, MD  doxazosin (CARDURA) 8 MG tablet Take 8 mg by mouth daily.   Yes Historical Provider, MD  finasteride (PROSCAR) 5 MG tablet Take 5 mg by mouth daily.   Yes Historical Provider, MD  lisinopril (PRINIVIL,ZESTRIL) 2.5 MG tablet Take 1 tablet (2.5 mg total) by mouth daily. 04/07/14  Yes Arnoldo Lenis, MD  metoprolol succinate (TOPROL-XL) 25 MG 24 hr tablet Take 25 mg by mouth daily.   Yes Historical Provider, MD  nitroGLYCERIN (NITROSTAT) 0.4 MG SL tablet Place 1 tablet (0.4 mg total) under the tongue every 5 (five) minutes as needed for chest pain. 04/07/14  Yes Arnoldo Lenis, MD  Omega-3 Fatty Acids (FISH OIL PO) Take 1 capsule by mouth daily.   Yes Historical Provider, MD  ranolazine (RANEXA) 500 MG 12 hr tablet Take 1 tablet (500 mg total) by mouth 2 (two) times daily. 04/07/14  Yes Arnoldo Lenis, MD  vitamin E 400 UNIT capsule Take 400 Units by mouth daily.   Yes Historical Provider, MD    Inpatient Medications:    . sodium chloride 1,000 mL (04/09/14 1057)  Allergies: No Known Allergies  History   Social History  . Marital Status: Married    Spouse Name: N/A  . Number of Children: N/A  . Years of Education: N/A   Occupational History  . Not on file.   Social History Main Topics  . Smoking status: Former Smoker    Quit date: 02/07/1979  . Smokeless tobacco: Not on file  . Alcohol Use: No  . Drug Use: No  . Sexual Activity: Not on file   Other Topics Concern  . Not on file   Social History Narrative     No family history on file.   Review of Systems: General: negative for chills,  fever, night sweats or weight changes.  ENT: negative for rhinorrhea or epistaxis Cardiovascular: See HPI Dermatological: negative for rash Respiratory: negative for cough or wheezing GI: negative for diarrhea, bright red blood per rectum, melena, or hematemesis GU: no hematuria, urgency, or frequency Neurologic: See HPI Heme: no easy bruising or bleeding Endo: negative for excessive thirst, thyroid disorder, or flushing Musculoskeletal: negative for joint pain or swelling, negative for myalgias  All other systems reviewed and are otherwise negative except as noted above.  Physical Exam: Blood pressure 105/66, pulse 56, temperature 97.9 F (36.6 C), resp. rate 14, height 5\' 9"  (1.753 m), weight 174 lb (78.926 kg), SpO2 96 %. General: alert, sitting up in bed, NAD HEENT: [place]/AT, EOMI, mucus membranes moist CV: RRR,  No m/g/r Pulm: CTA bilterally, breaths non-labored Abd: BS+, soft, non-tender Ext: warm, no edema, moves all Neuro: alert and oriented x 3, no focal deficits     Labs: No results for input(s): CKTOTAL, CKMB, TROPONINI in the last 72 hours. Lab Results  Component Value Date   WBC 5.3 04/09/2014   HGB 12.2* 04/09/2014   HCT 36.5* 04/09/2014   MCV 93.6 04/09/2014   PLT 170 04/09/2014    Recent Labs Lab 04/09/14 1028  NA 136  K 4.5  CL 104  CO2 26  BUN 9  CREATININE 1.07  CALCIUM 9.1  GLUCOSE 105*   No results found for: CHOL, HDL, LDLCALC, TRIG No results found for: DDIMER  Radiology/Studies:  Dg Chest Port 1 View  04/09/2014   CLINICAL DATA:  Three-week history of difficulty breathing and chest pain  EXAM: PORTABLE CHEST - 1 VIEW  COMPARISON:  March 24, 2014  FINDINGS: There is underlying emphysematous change. There are a few small scattered granulomas. There is no edema or consolidation. Heart is upper normal in size with pulmonary vascularity within normal limits. No adenopathy. No bone lesions.  IMPRESSION: Underlying emphysematous change. No edema or  consolidation. Scattered small granulomas.   Electronically Signed   By: Lowella Grip III M.D.   On: 04/09/2014 10:36    EKG:  Sinus rhythm, no evidence of ischemia   Cardiac Studies: None  ASSESSMENT AND PLAN:   Unstable Angina: Patient presented with 3 week history of exertional chest pain that was relieved with nitro and rest consistent with stable angina and then had sudden chest pain at rest this morning concerning for unstable angina. He does have a history of CAD with prior stenting. His TIMI score is 3-4. Patient had a stress test last Thursday that was reportedly normal, but he was only able to complete a few minutes of the test due to dyspnea. He was scheduled to undergo cardiac cath tomorrow. Troponin negative x 1 and EKG without acute ischemic changes. Given the acuity of his pain and that it  is occurring at rest, will proceed with cardiac catheterization this afternoon.  - Start heparin gtt - Cardiac cath this afternoon  - Cardiac monitoring   Signed, Albin Felling MD 04/09/2014, 1:42 PM   Patient seen, examined. Available data reviewed. Agree with findings, assessment, and plan as outlined by Dr Arcelia Jew. Pt's exam shows an alert, oriented male in NAD. Lungs CTA, CV RRR without murmur, extremities without edema. EKG shows no acute changes. Pt was scheduled for cardiac cath tomorrow, but now has accelerating angina and rest pain. He has been started on NTG and IV heparin. Plan for urgent cardiac cath and possible PCI.   I have reviewed the risks, indications, and alternatives to cardiac catheterization and PTCA/stetning with the patient. Risks include but are not limited to bleeding, infection, vascular injury, stroke, myocardial infection, arrhythmia, kidney injury, radiation-related injury in the case of prolonged fluoroscopy use, emergency cardiac surgery, and death. The patient understands the risks of serious complication is low (<8%).   Sherren Mocha, M.D. 04/09/2014 2:56  PM

## 2014-04-09 NOTE — ED Provider Notes (Signed)
CSN: [id number]     Arrival date & time 04/09/14  1004 History   First MD Initiated Contact with Patient 04/09/14 1009     Chief Complaint  Patient presents with  . Chest Pain  . Dizziness   HPI Patient presents to the emergency room with complaints of chest pain that started again this morning at about 9 AM. He tried 2 sublingual nitroglycerin tablets that decreased the pain from a 10 out of 10-5 out of 10. He took a third tablet however and it did not help anymore at all. The patient has a known history of coronary artery disease with cardiac stenting.. Over the last 3 weeks he has been having intermittent episodes of chest pressure at rest. These episodes have been relieved with nitroglycerin. The patient has seen his cardiologist. He is scheduled for a cardiac catheterization tomorrow.  The pain is described as a burning and pressure all over his chest. Has had some neck pain as well. He denies any nausea vomiting or shortness of breath. Symptoms are similar to prior anginal episodes. Past Medical History  Diagnosis Date  . CAD (coronary artery disease)   . Hyperlipemia   . Hypertension    Past Surgical History  Procedure Laterality Date  . Coronary angioplasty with stent placement    . Kidney stone surgery     No family history on file. History  Substance Use Topics  . Smoking status: Former Smoker    Quit date: 02/07/1979  . Smokeless tobacco: Not on file  . Alcohol Use: No    Review of Systems  All other systems reviewed and are negative.     Allergies  Review of patient's allergies indicates no known allergies.  Home Medications   Prior to Admission medications   Medication Sig Start Date End Date Taking? Authorizing Provider  Ascorbic Acid (VITAMIN C) 1000 MG tablet Take 1,000 mg by mouth daily.   Yes Historical Provider, MD  aspirin 81 MG tablet Take 81 mg by mouth daily.   Yes Historical Provider, MD  atorvastatin (LIPITOR) 80 MG tablet Take 80 mg by mouth daily.    Yes Historical Provider, MD  cholecalciferol (VITAMIN D) 1000 UNITS tablet Take 1,000 Units by mouth daily.   Yes Historical Provider, MD  doxazosin (CARDURA) 8 MG tablet Take 8 mg by mouth daily.   Yes Historical Provider, MD  finasteride (PROSCAR) 5 MG tablet Take 5 mg by mouth daily.   Yes Historical Provider, MD  lisinopril (PRINIVIL,ZESTRIL) 2.5 MG tablet Take 1 tablet (2.5 mg total) by mouth daily. 04/07/14  Yes Arnoldo Lenis, MD  metoprolol succinate (TOPROL-XL) 25 MG 24 hr tablet Take 25 mg by mouth daily.   Yes Historical Provider, MD  nitroGLYCERIN (NITROSTAT) 0.4 MG SL tablet Place 1 tablet (0.4 mg total) under the tongue every 5 (five) minutes as needed for chest pain. 04/07/14  Yes Arnoldo Lenis, MD  Omega-3 Fatty Acids (FISH OIL PO) Take 1 capsule by mouth daily.   Yes Historical Provider, MD  ranolazine (RANEXA) 500 MG 12 hr tablet Take 1 tablet (500 mg total) by mouth 2 (two) times daily. 04/07/14  Yes Arnoldo Lenis, MD  vitamin E 400 UNIT capsule Take 400 Units by mouth daily.   Yes Historical Provider, MD   BP 119/72 mmHg  Pulse 54  Temp(Src) 97.9 F (36.6 C)  Resp 13  Ht 5\' 9"  (1.753 m)  Wt 174 lb (78.926 kg)  BMI 25.68 kg/m2  SpO2 97%  Physical Exam  Constitutional: He appears well-developed and well-nourished. No distress.  HENT:  Head: Normocephalic and atraumatic.  Right Ear: External ear normal.  Left Ear: External ear normal.  Eyes: Conjunctivae are normal. Right eye exhibits no discharge. Left eye exhibits no discharge. No scleral icterus.  Neck: Neck supple. No tracheal deviation present.  Cardiovascular: Normal rate, regular rhythm and intact distal pulses.   Pulmonary/Chest: Effort normal and breath sounds normal. No stridor. No respiratory distress. He has no wheezes. He has no rales.  Abdominal: Soft. Bowel sounds are normal. He exhibits no distension. There is no tenderness. There is no rebound and no guarding.  Musculoskeletal: He exhibits no  edema or tenderness.  Neurological: He is alert. He has normal strength. No cranial nerve deficit (no facial droop, extraocular movements intact, no slurred speech) or sensory deficit. He exhibits normal muscle tone. He displays no seizure activity. Coordination normal.  Skin: Skin is warm and dry. No rash noted.  Psychiatric: He has a normal mood and affect.  Nursing note and vitals reviewed.   ED Course  Procedures (including critical care time) Labs Review Labs Reviewed  CBC - Abnormal; Notable for the following:    RBC 3.90 (*)    Hemoglobin 12.2 (*)    HCT 36.5 (*)    All other components within normal limits  BASIC METABOLIC PANEL - Abnormal; Notable for the following:    Glucose, Bld 105 (*)    GFR calc non Af Amer 67 (*)    GFR calc Af Amer 77 (*)    All other components within normal limits  APTT  PROTIME-INR  I-STAT TROPOININ, ED  Randolm Idol, ED    Imaging Review Dg Chest Port 1 View  04/09/2014   CLINICAL DATA:  Three-week history of difficulty breathing and chest pain  EXAM: PORTABLE CHEST - 1 VIEW  COMPARISON:  March 24, 2014  FINDINGS: There is underlying emphysematous change. There are a few small scattered granulomas. There is no edema or consolidation. Heart is upper normal in size with pulmonary vascularity within normal limits. No adenopathy. No bone lesions.  IMPRESSION: Underlying emphysematous change. No edema or consolidation. Scattered small granulomas.   Electronically Signed   By: Lowella Grip III M.D.   On: 04/09/2014 10:36     EKG Interpretation   Date/Time:  Thursday April 09 2014 10:10:13 EST Ventricular Rate:  62 PR Interval:  195 QRS Duration: 80 QT Interval:  392 QTC Calculation: 398 R Axis:   52 Text Interpretation:  Sinus rhythm Artifact Otherwise no significant  change Confirmed by Tacora Athanas  MD-J, Nazaiah Navarrete ([id number]) on 04/09/2014 10:30:10 AM     Medications  0.9 %  sodium chloride infusion (1,000 mLs Intravenous New Bag/Given 04/09/14  1057)  nitroGLYCERIN (NITROSTAT) SL tablet 0.4 mg (not administered)  aspirin chewable tablet 324 mg (324 mg Oral Given 04/09/14 1048)  nitroGLYCERIN (NITROGLYN) 2 % ointment 1 inch (1 inch Topical Given 04/09/14 1118)  morphine 4 MG/ML injection 4 mg (4 mg Intravenous Given 04/09/14 1057)    MDM   Final diagnoses:  Chest pain, unspecified chest pain type    Pt with history of CAD.  Presenting with symptoms concerning for unstable angina.  No signs of acute stemi at this time.  Discussed with cardiology.  Plan on admission for further treatment.  Has cath scheduled for tomorrow.    Dorie Rank, MD 04/09/14 [phone]

## 2014-04-09 NOTE — Telephone Encounter (Signed)
Thanks for update, agree with going to ER   Zandra Abts MD

## 2014-04-09 NOTE — Interval H&P Note (Signed)
History and Physical Interval Note:  04/09/2014 5:51 PM  Jakarri Fidel Levy Hagarty  has presented today for surgery, with the diagnosis of Unstable AnginaThe various methods of treatment have been discussed with the patient and family. After consideration of risks, benefits and other options for treatment, the patient has consented to  Procedure(s): LEFT HEART CATHETERIZATION WITH CORONARY ANGIOGRAM (N/A) as a surgical intervention .  The patient's history has been reviewed, patient examined, no change in status, stable for surgery.  I have reviewed the patient's chart and labs.  Questions were answered to the patient's satisfaction.    Cath Lab Visit (complete for each Cath Lab visit)  Clinical Evaluation Leading to the Procedure:   ACS: Yes.    Non-ACS:    Anginal Classification: CCS IV  Anti-ischemic medical therapy: Maximal Therapy (2 or more classes of medications)  Non-Invasive Test Results: No non-invasive testing performed  Prior CABG: No previous CABG   APPROPRIATENESS OF USE FOR PCI TIMI SCORE  Patient Information:  TIMI Score is 5   A/NSTEMI and high-risk features for short-term risk of death or nonfatal MI Revascularization of the presumed culprit artery  A (9)  Indication: 11; Score: 9 TIMI SCORE  Patient Information:  TIMI Score is 5   A/NSTEMI and high-risk features for short-term risk of death or nonfatal MI Revascularization of multiple coronary arteries when the culprit artery cannot clearly be determined  A (9)  Indication: 12; Score: 9   HARDING, DAVID W

## 2014-04-10 ENCOUNTER — Ambulatory Visit (HOSPITAL_COMMUNITY): Admission: RE | Admit: 2014-04-10 | Payer: PPO | Source: Ambulatory Visit | Admitting: Cardiology

## 2014-04-10 DIAGNOSIS — I2510 Atherosclerotic heart disease of native coronary artery without angina pectoris: Secondary | ICD-10-CM

## 2014-04-10 DIAGNOSIS — E785 Hyperlipidemia, unspecified: Secondary | ICD-10-CM

## 2014-04-10 DIAGNOSIS — I200 Unstable angina: Secondary | ICD-10-CM

## 2014-04-10 DIAGNOSIS — Z9861 Coronary angioplasty status: Secondary | ICD-10-CM

## 2014-04-10 DIAGNOSIS — I10 Essential (primary) hypertension: Secondary | ICD-10-CM

## 2014-04-10 LAB — POCT I-STAT TROPONIN I: TROPONIN I, POC: 0.01 ng/mL (ref 0.00–0.08)

## 2014-04-10 LAB — D-DIMER, QUANTITATIVE: D-Dimer, Quant: 0.29 ug/mL-FEU (ref 0.00–0.48)

## 2014-04-10 LAB — HEPARIN LEVEL (UNFRACTIONATED): Heparin Unfractionated: 0.71 IU/mL — ABNORMAL HIGH (ref 0.30–0.70)

## 2014-04-10 MED ORDER — PANTOPRAZOLE SODIUM 40 MG PO TBEC: 40.0000 mg | DELAYED_RELEASE_TABLET | Freq: Every day | ORAL | Status: DC

## 2014-04-10 MED ORDER — ACETAMINOPHEN 500 MG PO TABS
500.0000 mg | ORAL_TABLET | Freq: Once | ORAL | Status: AC
  Administered 2014-04-10: 500 mg via ORAL
  Filled 2014-04-10: qty 1

## 2014-04-10 MED ORDER — DIPHENHYDRAMINE HCL 25 MG PO CAPS
25.0000 mg | ORAL_CAPSULE | Freq: Once | ORAL | Status: AC
  Administered 2014-04-10: 25 mg via ORAL
  Filled 2014-04-10: qty 1

## 2014-04-10 NOTE — Progress Notes (Signed)
Echocardiogram 2D Echocardiogram has been performed.  Eric Benitez 04/10/2014, 9:35 AM

## 2014-04-10 NOTE — Telephone Encounter (Signed)
Had cath yesterday - normal cors. Bee

## 2014-04-10 NOTE — Discharge Summary (Signed)
CARDIOLOGY DISCHARGE SUMMARY   Patient ID: Eric Benitez MRN: [id number] DOB/AGE: 04/26/1940 74 y.o.  Admit date: 04/09/2014 Discharge date: 04/10/2014  PCP: Gar Ponto, MD Primary Cardiologist: Dr. Harl Bowie  Primary Discharge Diagnosis:  Unstable angina- cath with medical therapy recommended Secondary Discharge Diagnosis:    CAD S/P percutaneous coronary angioplasty - PCI to LAD in 2006   Essential hypertension   Hyperlipidemia with target LDL less than 70   Pain in the chest   Chest pain with low risk of acute coronary syndrome  Procedures: Cardiac catheterization, coronary arteriogram, left ventriculogram, 2-D echocardiogram  Hospital Course: Eric Benitez is a 74 y.o. male with a history of CAD. He began having intermittent chest pain with exertion, relieved by sublingual nitroglycerin and rest. It worsened and his symptoms were consistent with unstable anginal pain. He then had an episode of presyncope. He came to the hospital and was admitted for further evaluation and treatment.  His cardiac enzymes were negative for MI. A recent stress test at [facility] was reportedly normal and not repeated. Cardiac catheterization was indicated to further define his anatomy and this was performed on 04/09/2014.  Cardiac catheterization results are below. He had noncritical disease and no culprit lesion was identified. His EF was normal with no wall motion abnormalities. There was concern for small vessel disease and he is to continue on his current intake anginal therapy. Additionally, there is concern for reflux causing part of his symptoms and he will be started on proton pump inhibitor.  On 04/10/2014, he was seen by Dr. Radford Pax and all data were reviewed. A d-dimer was checked to exclude PE as a cause of his symptoms and was normal. Dr. Radford Pax wanted to check a BMET post cath, but the patient refused this and stated he felt like his kidneys were functioning well. Dr. Radford Pax  felt no further inpatient workup was indicated and the patient is considered stable for discharge, to follow up as an outpatient in [facility] with Dr. Harl Bowie.  Labs:   Lab Results  Component Value Date   WBC 5.3 04/09/2014   HGB 12.2* 04/09/2014   HCT 36.5* 04/09/2014   MCV 93.6 04/09/2014   PLT 170 04/09/2014     Recent Labs Lab 04/09/14 1028  NA 136  K 4.5  CL 104  CO2 26  BUN 9  CREATININE 1.07  CALCIUM 9.1  GLUCOSE 105*    Recent Labs  04/09/14 1028  INR 1.07     Radiology: Dg Chest Port 1 View 04/09/2014   CLINICAL DATA:  Three-week history of difficulty breathing and chest pain  EXAM: PORTABLE CHEST - 1 VIEW  COMPARISON:  March 24, 2014  FINDINGS: There is underlying emphysematous change. There are a few small scattered granulomas. There is no edema or consolidation. Heart is upper normal in size with pulmonary vascularity within normal limits. No adenopathy. No bone lesions.  IMPRESSION: Underlying emphysematous change. No edema or consolidation. Scattered small granulomas.   Electronically Signed   By: Lowella Grip III M.D.   On: 04/09/2014 10:36    Cardiac Cath: 04/09/2014 Hemodynamics:   Central Aortic Pressure / Mean: 126/67/92 mmHg  Left Ventricular Pressure / LVEDP: 128/10/16 mmHg Left Ventriculography:  EF: 50-65 %  Wall Motion: Normal to hyperdynamic Coronary Anatomy:  Dominance: Co-dominant  Left Main: Large-caliber vessel that is short. Bifurcates into the LAD and Circumflex. Angiographically normal. LAD: Large-caliber vessel with a very large proximal D1 branch that comes off just prior  to a widely patent stent in the early mid vessel. There is a small second diagonal branch that arises from the stented segment and another small third diagonal branch more distally. The distal vessel tapers into a moderate caliber vessel that wraps around the apex perfusing the distal third of the inferoapex.  D1: Large-caliber proximal branch with ostial 40-50%  focal stenosis. The vessel then courses almost of the ramus intermedius. The vessel is very tortuous gives rise to several mold-moderate caliber branches distally. Beyond the ostial lesion, the vessel remains angiographically normal. Left Circumflex: Moderate large-caliber, codominant vessel. There is a small moderate caliber first obtuse marginal branch before the vessel courses into the AV groove. In the mid AV groove there is a hairpin turn as the vessel courses distally to provide to posterior lateral branches with the first being moderate caliber and second mean small caliber.  OM1: Small moderate caliber branch that does not cover large distribution. Tortuous but free of disease.  RCA: Moderate large-caliber, codominant vessel. Minimal luminal irregularities are vessel tapers down to [place] more moderate caliber right posterior descending arteries. There is a very small posterior lateral system. After reviewing the initial angiography, no culprit lesion was identified.   EKG: 04/09/2014 Sinus rhythm, no acute ischemic changes  Echo: 04/10/2014 Conclusions - Left ventricle: The cavity size was normal. Wall thickness was normal. Systolic function was normal. The estimated ejection fraction was in the range of 60% to 65%. Wall motion was normal; there were no regional wall motion abnormalities. Left ventricular diastolic function parameters were normal. - Mitral valve: There was mild regurgitation.  FOLLOW UP PLANS AND APPOINTMENTS No Known Allergies   Medication List    TAKE these medications        aspirin 81 MG tablet  Take 81 mg by mouth daily.     atorvastatin 80 MG tablet  Commonly known as:  LIPITOR  Take 80 mg by mouth daily.     cholecalciferol 1000 UNITS tablet  Commonly known as:  VITAMIN D  Take 1,000 Units by mouth daily.     doxazosin 8 MG tablet  Commonly known as:  CARDURA  Take 8 mg by mouth daily.     finasteride 5 MG tablet  Commonly known as:   PROSCAR  Take 5 mg by mouth daily.     FISH OIL PO  Take 1 capsule by mouth daily.     lisinopril 2.5 MG tablet  Commonly known as:  PRINIVIL,ZESTRIL  Take 1 tablet (2.5 mg total) by mouth daily.     metoprolol succinate 25 MG 24 hr tablet  Commonly known as:  TOPROL-XL  Take 25 mg by mouth daily.     nitroGLYCERIN 0.4 MG SL tablet  Commonly known as:  NITROSTAT  Place 1 tablet (0.4 mg total) under the tongue every 5 (five) minutes as needed for chest pain.     pantoprazole 40 MG tablet  Commonly known as:  PROTONIX  Take 1 tablet (40 mg total) by mouth daily.     ranolazine 500 MG 12 hr tablet  Commonly known as:  RANEXA  Take 1 tablet (500 mg total) by mouth 2 (two) times daily.     vitamin C 1000 MG tablet  Take 1,000 mg by mouth daily.     vitamin E 400 UNIT capsule  Take 400 Units by mouth daily.        Discharge Instructions    Diet - low sodium heart healthy    Complete  by:  As directed      Increase activity slowly    Complete by:  As directed           Follow-up Information    Follow up with Arnoldo Lenis, MD.   Specialty:  Cardiology   Why:  The office will call.   Contact information:   [place] [id number] [phone]       BRING ALL MEDICATIONS WITH YOU TO FOLLOW UP APPOINTMENTS  Time spent with patient to include physician time: 38 min Signed: Rosaria Ferries, PA-C 04/10/2014, 3:15 PM Co-Sign MD

## 2014-04-10 NOTE — Discharge Instructions (Signed)
PLEASE REMEMBER TO BRING ALL OF YOUR MEDICATIONS TO EACH OF YOUR FOLLOW-UP OFFICE VISITS. ° °PLEASE ATTEND ALL SCHEDULED FOLLOW-UP APPOINTMENTS.  ° °Activity: Increase activity slowly as tolerated. You may shower, but no soaking baths (or swimming) for 1 week. No driving for 2 days. No lifting over 5 lbs for 1 week. No sexual activity for 1 week.  ° °You May Return to Work: in 1 week (if applicable) ° °Wound Care: You may wash cath site gently with soap and water. Keep cath site clean and dry. If you notice pain, swelling, bleeding or pus at your cath site, please call [phone]. ° ° ° °Cardiac Cath Site Care °Refer to this sheet in the next few weeks. These instructions provide you with information on caring for yourself after your procedure. Your caregiver may also give you more specific instructions. Your treatment has been planned according to current medical practices, but problems sometimes occur. Call your caregiver if you have any problems or questions after your procedure. °HOME CARE INSTRUCTIONS °· You may shower 24 hours after the procedure. Remove the bandage (dressing) and gently wash the site with plain soap and water. Gently pat the site dry.  °· Do not apply powder or lotion to the site.  °· Do not sit in a bathtub, swimming pool, or whirlpool for 5 to 7 days.  °· No bending, squatting, or lifting anything over 10 pounds (4.5 kg) as directed by your caregiver.  °· Inspect the site at least twice daily.  °· Do not drive home if you are discharged the same day of the procedure. Have someone else drive you.  °· You may drive 24 hours after the procedure unless otherwise instructed by your caregiver.  °What to expect: °· Any bruising will usually fade within 1 to 2 weeks.  °· Blood that collects in the tissue (hematoma) may be painful to the touch. It should usually decrease in size and tenderness within 1 to 2 weeks.  °SEEK IMMEDIATE MEDICAL CARE IF: °· You have unusual pain at the site or down the  affected limb.  °· You have redness, warmth, swelling, or pain at the site.  °· You have drainage (other than a small amount of blood on the dressing).  °· You have chills.  °· You have a fever or persistent symptoms for more than 72 hours.  °· You have a fever and your symptoms suddenly get worse.  °· Your leg becomes pale, cool, tingly, or numb.  °· You have heavy bleeding from the site. Hold pressure on the site.  °Document Released: 02/25/2010 Document Revised: 01/12/2011 Document Reviewed: 02/25/2010 °Exit[employer]® Patient Information ©2012 [employer]. ° °

## 2014-04-10 NOTE — Progress Notes (Signed)
SUBJECTIVE:  Denies and chest pain  OBJECTIVE:   Vitals:   Filed Vitals:   04/09/14 2020 04/09/14 2045 04/09/14 2100 04/10/14 0452  BP: 129/67 117/71 132/67 115/60  Pulse:  68  58  Temp:  98 F (36.7 C)  98 F (36.7 C)  TempSrc:  Oral  Oral  Resp:  18  18  Height:      Weight:    172 lb 1.6 oz (78.064 kg)  SpO2:  96%  97%   I&O's:   Intake/Output Summary (Last 24 hours) at 04/10/14 4008 Last data filed at 04/10/14 0600  Gross per 24 hour  Intake    480 ml  Output      0 ml  Net    480 ml   TELEMETRY: Reviewed telemetry pt in NSR:     PHYSICAL EXAM General: Well developed, well nourished, in no acute distress Head: Eyes PERRLA, No xanthomas.   Normal cephalic and atramatic  Lungs:   Clear bilaterally to auscultation and percussion. Heart:   HRRR S1 S2 Pulses are 2+ & equal. Abdomen: Bowel sounds are positive, abdomen soft and non-tender without massesExtremities:   No clubbing, cyanosis or edema.  DP +1 Neuro: Alert and oriented X 3. Psych:  Good affect, responds appropriately   LABS: Basic Metabolic Panel:  Recent Labs  04/09/14 1028  NA 136  K 4.5  CL 104  CO2 26  GLUCOSE 105*  BUN 9  CREATININE 1.07  CALCIUM 9.1   Liver Function Tests: No results for input(s): AST, ALT, ALKPHOS, BILITOT, PROT, ALBUMIN in the last 72 hours. No results for input(s): LIPASE, AMYLASE in the last 72 hours. CBC:  Recent Labs  04/09/14 1028  WBC 5.3  HGB 12.2*  HCT 36.5*  MCV 93.6  PLT 170   Cardiac Enzymes: No results for input(s): CKTOTAL, CKMB, CKMBINDEX, TROPONINI in the last 72 hours. BNP: Invalid input(s): POCBNP D-Dimer: No results for input(s): DDIMER in the last 72 hours. Hemoglobin A1C: No results for input(s): HGBA1C in the last 72 hours. Fasting Lipid Panel: No results for input(s): CHOL, HDL, LDLCALC, TRIG, CHOLHDL, LDLDIRECT in the last 72 hours. Thyroid Function Tests: No results for input(s): TSH, T4TOTAL, T3FREE, THYROIDAB in the last 72  hours.  Invalid input(s): FREET3 Anemia Panel: No results for input(s): VITAMINB12, FOLATE, FERRITIN, TIBC, IRON, RETICCTPCT in the last 72 hours. Coag Panel:   Lab Results  Component Value Date   INR 1.07 04/09/2014    RADIOLOGY: Dg Chest Port 1 View  04/09/2014   CLINICAL DATA:  Three-week history of difficulty breathing and chest pain  EXAM: PORTABLE CHEST - 1 VIEW  COMPARISON:  March 24, 2014  FINDINGS: There is underlying emphysematous change. There are a few small scattered granulomas. There is no edema or consolidation. Heart is upper normal in size with pulmonary vascularity within normal limits. No adenopathy. No bone lesions.  IMPRESSION: Underlying emphysematous change. No edema or consolidation. Scattered small granulomas.   Electronically Signed   By: Lowella Grip III M.D.   On: 04/09/2014 10:36   ASSESSMENT AND PLAN:   Unstable Angina: Patient presented with 3 week history of exertional chest pain that was relieved with nitro and rest consistent with stable angina and then had sudden chest pain at rest this morning concerning for unstable angina. He does have a history of CAD with prior stenting. His TIMI score is 3-4. Patient had a stress test last Thursday that was reportedly normal, but he was  only able to complete a few minutes of the test due to dyspnea.  Troponin negative x 2 and EKG without acute ischemic changes. Cath yesterday showed  Mild nonobstructive ASCAD with 40-50% ostial D1 disease otherwise normal coronary arteries.  LVF and LVEDP were normal.  Check BMET this am post cath.  Will check a D-DImer to rule out PE.  If positive then chest CT angio.  If negative then d/c home.  Dyslipidemia - continue statin  ASCAD s/p LAD stent 2006 - cath with 40-50% ostial diagonal.  Continue statin/ASA/BB/Ranexa  HTN - controlled on medical therapy     Sueanne Margarita, MD  04/10/2014  8:08 AM

## 2014-04-10 NOTE — Progress Notes (Signed)
UR Completed Lakashia Collison Graves-Bigelow, RN,BSN [phone]  

## 2014-05-01 ENCOUNTER — Ambulatory Visit (INDEPENDENT_AMBULATORY_CARE_PROVIDER_SITE_OTHER): Payer: PPO | Admitting: Cardiology

## 2014-05-01 ENCOUNTER — Encounter: Payer: Self-pay | Admitting: Cardiology

## 2014-05-01 VITALS — BP 117/71 | HR 86 | Ht 69.0 in | Wt 172.0 lb

## 2014-05-01 DIAGNOSIS — R0789 Other chest pain: Secondary | ICD-10-CM | POA: Diagnosis not present

## 2014-05-01 DIAGNOSIS — I2510 Atherosclerotic heart disease of native coronary artery without angina pectoris: Secondary | ICD-10-CM | POA: Diagnosis not present

## 2014-05-01 NOTE — Progress Notes (Signed)
Clinical Summary Eric Benitez is a 74 y.o.male seen today for follow up of the following medical problems. This is a focused visit after recent cardiac cath.   1. CAD - hx of prior stenting 10 years ago - last visit reported symptoms of chest pain concerning for recurrence of obstructive CAD. Referred for cath.   - cath 04/09/14 with patent coronaries.  - reports symptoms have improved since starting ranexa. Since last visit has not had any chest pain    Past Medical History  Diagnosis Date  . CAD (coronary artery disease)   . Hyperlipemia   . Hypertension   . Kidney stones     "I've had several; had to go in and get them once" (04/09/2014)  . Pneumonia 1940's  . Bleeds easily   . Arthritis     "neck, hands, fingers" (04/09/2014)     No Known Allergies   Current Outpatient Prescriptions  Medication Sig Dispense Refill  . Ascorbic Acid (VITAMIN C) 1000 MG tablet Take 1,000 mg by mouth daily.    Marland Kitchen aspirin 81 MG tablet Take 81 mg by mouth daily.    Marland Kitchen atorvastatin (LIPITOR) 80 MG tablet Take 80 mg by mouth daily.    . cholecalciferol (VITAMIN D) 1000 UNITS tablet Take 1,000 Units by mouth daily.    Marland Kitchen doxazosin (CARDURA) 8 MG tablet Take 8 mg by mouth daily.    . finasteride (PROSCAR) 5 MG tablet Take 5 mg by mouth daily.    Marland Kitchen lisinopril (PRINIVIL,ZESTRIL) 2.5 MG tablet Take 1 tablet (2.5 mg total) by mouth daily. 90 tablet 3  . metoprolol succinate (TOPROL-XL) 25 MG 24 hr tablet Take 25 mg by mouth daily.    . nitroGLYCERIN (NITROSTAT) 0.4 MG SL tablet Place 1 tablet (0.4 mg total) under the tongue every 5 (five) minutes as needed for chest pain. 90 tablet 3  . Omega-3 Fatty Acids (FISH OIL PO) Take 1 capsule by mouth daily.    . pantoprazole (PROTONIX) 40 MG tablet Take 1 tablet (40 mg total) by mouth daily. 30 tablet 11  . ranolazine (RANEXA) 500 MG 12 hr tablet Take 1 tablet (500 mg total) by mouth 2 (two) times daily. 60 tablet 3  . vitamin E 400 UNIT capsule Take 400 Units  by mouth daily.     No current facility-administered medications for this visit.     Past Surgical History  Procedure Laterality Date  . Cystoscopy w/ stone manipulation  ~ 2008  . Coronary angioplasty with stent placement  2006    "2"  . Cardiac catheterization  1990's X 2;  04/09/2014  . Fracture surgery    . Ankle fracture surgery Right 2009  . Nasal septum surgery  1999  . Left heart catheterization with coronary angiogram N/A 04/09/2014    Procedure: LEFT HEART CATHETERIZATION WITH CORONARY ANGIOGRAM;  Surgeon: Leonie Man, MD;  Location: [facility] CATH LAB;  Service: Cardiovascular;  Laterality: N/A;     No Known Allergies    No family history on file.   Social History Mr. Downum reports that he quit smoking about 35 years ago. His smoking use included Cigarettes. He has a 37.5 pack-year smoking history. He has never used smokeless tobacco. Mr. Dasch reports that he drinks alcohol.   Review of Systems CONSTITUTIONAL: No weight loss, fever, chills, weakness or fatigue.  HEENT: Eyes: No visual loss, blurred vision, double vision or yellow sclerae.No hearing loss, sneezing, congestion, runny nose or sore throat.  SKIN: No rash or itching.  CARDIOVASCULAR: per HPI RESPIRATORY: No shortness of breath, cough or sputum.  GASTROINTESTINAL: No anorexia, nausea, vomiting or diarrhea. No abdominal pain or blood.  GENITOURINARY: No burning on urination, no polyuria NEUROLOGICAL: No headache, dizziness, syncope, paralysis, ataxia, numbness or tingling in the extremities. No change in bowel or bladder control.  MUSCULOSKELETAL: No muscle, back pain, joint pain or stiffness.  LYMPHATICS: No enlarged nodes. No history of splenectomy.  PSYCHIATRIC: No history of depression or anxiety.  ENDOCRINOLOGIC: No reports of sweating, cold or heat intolerance. No polyuria or polydipsia.  Marland Kitchen   Physical Examination p 86 bp 117/71 Wt 172 lbs BMI 25 Gen: resting comfortably, no acute distress HEENT:  no scleral icterus, pupils equal round and reactive, no palptable cervical adenopathy,  CV: RRR, no m/r/g, no JVD, no carotid bruits Resp: Clear to auscultation bilaterally GI: abdomen is soft, non-tender, non-distended, normal bowel sounds, no hepatosplenomegaly MSK: extremities are warm, no edema.  Skin: warm, no rash Neuro:  no focal deficits Psych: appropriate affect   Diagnostic Studies  04/09/14 Cath Hemodynamics:   Central Aortic Pressure / Mean: 126/67/92 mmHg  Left Ventricular Pressure / LVEDP: 128/10/16 mmHg  Left Ventriculography:  EF: 50-65 %  Wall Motion: Normal to hyperdynamic  Coronary Anatomy:  Dominance: Co-dominant  Left Main: Large-caliber vessel that is short. Bifurcates into the LAD and Circumflex. Angiographically normal. LAD: Large-caliber vessel with a very large proximal D1 Fleurette Woolbright that comes off just prior to a widely patent stent in the early mid vessel. There is a small second diagonal Kaiah Hosea that arises from the stented segment and another small third diagonal Livy Ross more distally. The distal vessel tapers into a moderate caliber vessel that wraps around the apex perfusing the distal third of the inferoapex.  D1: Large-caliber proximal Roopa Graver with ostial 40-50% focal stenosis. The vessel then courses almost of the ramus intermedius. The vessel is very tortuous gives rise to several mold-moderate caliber branches distally. Beyond the ostial lesion, the vessel remains angiographically normal.  Left Circumflex: Moderate large-caliber, codominant vessel. There is a small moderate caliber first obtuse marginal Tyrome Donatelli before the vessel courses into the AV groove. In the mid AV groove there is a hairpin turn as the vessel courses distally to provide to posterior lateral branches with the first being moderate caliber and second mean small caliber.  OM1: Small moderate caliber Gabrielle Mester that does not cover large distribution. Tortuous but free of  disease.   RCA: Moderate large-caliber, codominant vessel. Minimal luminal irregularities are vessel tapers down to [place] more moderate caliber right posterior descending arteries. There is a very small posterior lateral system.  After reviewing the initial angiography, no culprit lesion was identified.    Assessment and Plan   1. CAD - recent episodes of chest pain. Cath shows patent coronaries. Symptoms improved after starting ranexa, will continue.  Ranexa chosen due to soft bp's in the setting of chest pain.  - continue medical therapy.    F/u 6 months  Arnoldo Lenis, M.D.

## 2014-05-01 NOTE — Patient Instructions (Signed)

## 2014-07-06 DIAGNOSIS — J45909 Unspecified asthma, uncomplicated: Secondary | ICD-10-CM | POA: Insufficient documentation

## 2014-07-06 DIAGNOSIS — N529 Male erectile dysfunction, unspecified: Secondary | ICD-10-CM | POA: Insufficient documentation

## 2014-07-06 DIAGNOSIS — I2510 Atherosclerotic heart disease of native coronary artery without angina pectoris: Secondary | ICD-10-CM | POA: Insufficient documentation

## 2014-08-03 ENCOUNTER — Other Ambulatory Visit: Payer: Self-pay | Admitting: *Deleted

## 2014-08-03 MED ORDER — RANOLAZINE ER 500 MG PO TB12: 500.0000 mg | ORAL_TABLET | Freq: Two times a day (BID) | ORAL | Status: DC

## 2014-10-19 ENCOUNTER — Ambulatory Visit (INDEPENDENT_AMBULATORY_CARE_PROVIDER_SITE_OTHER): Payer: PPO | Admitting: Cardiology

## 2014-10-19 ENCOUNTER — Encounter: Payer: Self-pay | Admitting: Cardiology

## 2014-10-19 VITALS — BP 154/82 | HR 62 | Ht 69.0 in | Wt 169.1 lb

## 2014-10-19 DIAGNOSIS — I10 Essential (primary) hypertension: Secondary | ICD-10-CM

## 2014-10-19 DIAGNOSIS — E785 Hyperlipidemia, unspecified: Secondary | ICD-10-CM | POA: Diagnosis not present

## 2014-10-19 DIAGNOSIS — I2510 Atherosclerotic heart disease of native coronary artery without angina pectoris: Secondary | ICD-10-CM

## 2014-10-19 NOTE — Progress Notes (Signed)
Patient ID: Eric Benitez, male   DOB: [birth year], 74 y.o.   MRN: [id number]     Clinical Summary Eric Benitez is a 74 y.o.male seen today for follow up of the following medical problems.   1. CAD - hx of prior stenting 10 years ago - cath 04/09/14 with patent coronaries.  - since starting ranexa previously his chest pain has resolved. No new symptoms since last visit. Stable DOE with moderate levels of activity.   2. HTN - bp meds decreased in the past due to orthostatic symptoms - he is compliant with meds but forgot to take this AM    3. Hyperlipidemia - has labs pending in Nov with pcp Dr Quillian Quince   Past Medical History  Diagnosis Date  . CAD (coronary artery disease)   . Hyperlipemia   . Hypertension   . Kidney stones     "I've had several; had to go in and get them once" (04/09/2014)  . Pneumonia 1940's  . Bleeds easily   . Arthritis     "neck, hands, fingers" (04/09/2014)     No Known Allergies   Current Outpatient Prescriptions  Medication Sig Dispense Refill  . Ascorbic Acid (VITAMIN C) 1000 MG tablet Take 1,000 mg by mouth daily.    Marland Kitchen aspirin 81 MG tablet Take 81 mg by mouth daily.    Marland Kitchen atorvastatin (LIPITOR) 80 MG tablet Take 80 mg by mouth daily.    . cholecalciferol (VITAMIN D) 1000 UNITS tablet Take 1,000 Units by mouth daily.    Marland Kitchen doxazosin (CARDURA) 8 MG tablet Take 8 mg by mouth daily.    . finasteride (PROSCAR) 5 MG tablet Take 5 mg by mouth daily.    Marland Kitchen lisinopril (PRINIVIL,ZESTRIL) 2.5 MG tablet Take 1 tablet (2.5 mg total) by mouth daily. 90 tablet 3  . metoprolol succinate (TOPROL-XL) 25 MG 24 hr tablet Take 25 mg by mouth daily.    . nitroGLYCERIN (NITROSTAT) 0.4 MG SL tablet Place 1 tablet (0.4 mg total) under the tongue every 5 (five) minutes as needed for chest pain. 90 tablet 3  . Omega-3 Fatty Acids (FISH OIL PO) Take 1 capsule by mouth daily.    . pantoprazole (PROTONIX) 40 MG tablet Take 1 tablet (40 mg total) by mouth daily. 30 tablet 11    . ranolazine (RANEXA) 500 MG 12 hr tablet Take 1 tablet (500 mg total) by mouth 2 (two) times daily. 60 tablet 6  . vitamin E 400 UNIT capsule Take 400 Units by mouth daily.     No current facility-administered medications for this visit.     Past Surgical History  Procedure Laterality Date  . Cystoscopy w/ stone manipulation  ~ 2008  . Coronary angioplasty with stent placement  2006    "2"  . Cardiac catheterization  1990's X 2;  04/09/2014  . Fracture surgery    . Ankle fracture surgery Right 2009  . Nasal septum surgery  1999  . Left heart catheterization with coronary angiogram N/A 04/09/2014    Procedure: LEFT HEART CATHETERIZATION WITH CORONARY ANGIOGRAM;  Surgeon: Leonie Man, MD;  Location: [facility] CATH LAB;  Service: Cardiovascular;  Laterality: N/A;     No Known Allergies    No family history on file.   Social History Eric Benitez reports that he quit smoking about 50 years ago. His smoking use included Cigarettes. He started smoking about 71 years ago. He has a 33 pack-year smoking history. He has never used smokeless tobacco.  Eric Benitez reports that he drinks alcohol.   Review of Systems CONSTITUTIONAL: No weight loss, fever, chills, weakness or fatigue.  HEENT: Eyes: No visual loss, blurred vision, double vision or yellow sclerae.No hearing loss, sneezing, congestion, runny nose or sore throat.  SKIN: No rash or itching.  CARDIOVASCULAR: per HPI RESPIRATORY: No shortness of breath, cough or sputum.  GASTROINTESTINAL: No anorexia, nausea, vomiting or diarrhea. No abdominal pain or blood.  GENITOURINARY: No burning on urination, no polyuria NEUROLOGICAL: No headache, dizziness, syncope, paralysis, ataxia, numbness or tingling in the extremities. No change in bowel or bladder control.  MUSCULOSKELETAL: No muscle, back pain, joint pain or stiffness.  LYMPHATICS: No enlarged nodes. No history of splenectomy.  PSYCHIATRIC: No history of depression or anxiety.   ENDOCRINOLOGIC: No reports of sweating, cold or heat intolerance. No polyuria or polydipsia.  Marland Kitchen   Physical Examination Filed Vitals:   10/19/14 0815  BP: 154/82  Pulse: 62   Filed Vitals:   10/19/14 0815  Height: 5\' 9"  (1.753 m)  Weight: 169 lb 1.9 oz (76.712 kg)    Gen: resting comfortably, no acute distress HEENT: no scleral icterus, pupils equal round and reactive, no palptable cervical adenopathy,  CV: RRR, no m/r/g, no jvd Resp: Clear to auscultation bilaterally GI: abdomen is soft, non-tender, non-distended, normal bowel sounds, no hepatosplenomegaly MSK: extremities are warm, no edema.  Skin: warm, no rash Neuro:  no focal deficits Psych: appropriate affect   Diagnostic Studies  04/09/14 Cath Hemodynamics:   Central Aortic Pressure / Mean: 126/67/92 mmHg  Left Ventricular Pressure / LVEDP: 128/10/16 mmHg  Left Ventriculography:  EF: 50-65 %  Wall Motion: Normal to hyperdynamic  Coronary Anatomy:  Dominance: Co-dominant  Left Main: Large-caliber vessel that is short. Bifurcates into the LAD and Circumflex. Angiographically normal. LAD: Large-caliber vessel with a very large proximal D1 branch that comes off just prior to a widely patent stent in the early mid vessel. There is a small second diagonal branch that arises from the stented segment and another small third diagonal branch more distally. The distal vessel tapers into a moderate caliber vessel that wraps around the apex perfusing the distal third of the inferoapex.  D1: Large-caliber proximal branch with ostial 40-50% focal stenosis. The vessel then courses almost of the ramus intermedius. The vessel is very tortuous gives rise to several mold-moderate caliber branches distally. Beyond the ostial lesion, the vessel remains angiographically normal.  Left Circumflex: Moderate large-caliber, codominant vessel. There is a small moderate caliber first obtuse marginal branch before the vessel courses into  the AV groove. In the mid AV groove there is a hairpin turn as the vessel courses distally to provide to posterior lateral branches with the first being moderate caliber and second mean small caliber.  OM1: Small moderate caliber branch that does not cover large distribution. Tortuous but free of disease.   RCA: Moderate large-caliber, codominant vessel. Minimal luminal irregularities are vessel tapers down to [place] more moderate caliber right posterior descending arteries. There is a very small posterior lateral system.  After reviewing the initial angiography, no culprit lesion was identified.    Assessment and Plan  1. CAD - no current symptoms, will continue current meds  2. HTN - elevated in clinic, however he has not taken his meds yet today. Previously he has had borderline low bp's and orthostatics symptoms requiring his meds to be decreased - continue to follow  3. Hyperlipidemia - continue high dose statin in setting of CAD - f/u  lipid panel from pcp in Nov of this year    F/u 1 year   Arnoldo Lenis, M.D

## 2014-10-19 NOTE — Patient Instructions (Signed)
Your physician wants you to follow-up in: 1 year with Dr. Branch  You will receive a reminder letter in the mail two months in advance. If you don't receive a letter, please call our office to schedule the follow-up appointment.  Your physician recommends that you continue on your current medications as directed. Please refer to the Current Medication list given to you today.  Thank you for choosing [facility]!!   

## 2014-11-16 DIAGNOSIS — L570 Actinic keratosis: Secondary | ICD-10-CM | POA: Insufficient documentation

## 2014-11-23 ENCOUNTER — Telehealth: Payer: Self-pay | Admitting: Cardiology

## 2014-11-23 MED ORDER — RANOLAZINE ER 500 MG PO TB12: 500.0000 mg | ORAL_TABLET | Freq: Two times a day (BID) | ORAL | Status: DC

## 2014-11-23 NOTE — Telephone Encounter (Signed)
Wife informed that samples left up front for pick up.

## 2015-01-28 ENCOUNTER — Other Ambulatory Visit: Payer: Self-pay | Admitting: Cardiology

## 2015-01-28 MED ORDER — RANOLAZINE ER 500 MG PO TB12: 500.0000 mg | ORAL_TABLET | Freq: Two times a day (BID) | ORAL | Status: DC

## 2015-01-28 NOTE — Telephone Encounter (Signed)
Eric Benitez is requesting samples of Ranexa 500 mg

## 2015-01-28 NOTE — Telephone Encounter (Signed)
Samples of Ranexa ready to be picked up. Pt aware

## 2015-02-17 DIAGNOSIS — J019 Acute sinusitis, unspecified: Secondary | ICD-10-CM | POA: Diagnosis not present

## 2015-02-17 DIAGNOSIS — J40 Bronchitis, not specified as acute or chronic: Secondary | ICD-10-CM | POA: Diagnosis not present

## 2015-02-17 DIAGNOSIS — R0982 Postnasal drip: Secondary | ICD-10-CM | POA: Diagnosis not present

## 2015-02-25 DIAGNOSIS — J40 Bronchitis, not specified as acute or chronic: Secondary | ICD-10-CM | POA: Diagnosis not present

## 2015-02-25 DIAGNOSIS — R05 Cough: Secondary | ICD-10-CM | POA: Diagnosis not present

## 2015-04-12 DIAGNOSIS — J0100 Acute maxillary sinusitis, unspecified: Secondary | ICD-10-CM | POA: Insufficient documentation

## 2015-04-13 DIAGNOSIS — J0101 Acute recurrent maxillary sinusitis: Secondary | ICD-10-CM | POA: Diagnosis not present

## 2015-04-26 ENCOUNTER — Other Ambulatory Visit: Payer: Self-pay | Admitting: Physician Assistant

## 2015-05-24 ENCOUNTER — Other Ambulatory Visit: Payer: Self-pay | Admitting: Cardiology

## 2015-06-02 ENCOUNTER — Other Ambulatory Visit: Payer: Self-pay | Admitting: *Deleted

## 2015-06-02 MED ORDER — RANOLAZINE ER 500 MG PO TB12: 500.0000 mg | ORAL_TABLET | Freq: Two times a day (BID) | ORAL | Status: DC

## 2015-07-06 DIAGNOSIS — I10 Essential (primary) hypertension: Secondary | ICD-10-CM | POA: Diagnosis not present

## 2015-07-06 DIAGNOSIS — E782 Mixed hyperlipidemia: Secondary | ICD-10-CM | POA: Diagnosis not present

## 2015-07-06 DIAGNOSIS — K210 Gastro-esophageal reflux disease with esophagitis: Secondary | ICD-10-CM | POA: Diagnosis not present

## 2015-07-12 DIAGNOSIS — Z1212 Encounter for screening for malignant neoplasm of rectum: Secondary | ICD-10-CM | POA: Diagnosis not present

## 2015-07-12 DIAGNOSIS — K219 Gastro-esophageal reflux disease without esophagitis: Secondary | ICD-10-CM | POA: Diagnosis not present

## 2015-07-12 DIAGNOSIS — I10 Essential (primary) hypertension: Secondary | ICD-10-CM | POA: Diagnosis not present

## 2015-07-12 DIAGNOSIS — I25111 Atherosclerotic heart disease of native coronary artery with angina pectoris with documented spasm: Secondary | ICD-10-CM | POA: Diagnosis not present

## 2015-07-12 DIAGNOSIS — E782 Mixed hyperlipidemia: Secondary | ICD-10-CM | POA: Diagnosis not present

## 2015-07-12 DIAGNOSIS — Z0001 Encounter for general adult medical examination with abnormal findings: Secondary | ICD-10-CM | POA: Diagnosis not present

## 2015-07-12 DIAGNOSIS — N401 Enlarged prostate with lower urinary tract symptoms: Secondary | ICD-10-CM | POA: Diagnosis not present

## 2015-07-12 DIAGNOSIS — Z23 Encounter for immunization: Secondary | ICD-10-CM | POA: Diagnosis not present

## 2015-10-19 ENCOUNTER — Encounter: Payer: Self-pay | Admitting: *Deleted

## 2015-10-20 ENCOUNTER — Encounter: Payer: Self-pay | Admitting: Cardiology

## 2015-10-20 ENCOUNTER — Ambulatory Visit (INDEPENDENT_AMBULATORY_CARE_PROVIDER_SITE_OTHER): Payer: PPO | Admitting: Cardiology

## 2015-10-20 ENCOUNTER — Encounter: Payer: Self-pay | Admitting: *Deleted

## 2015-10-20 VITALS — BP 135/73 | HR 60 | Ht 69.0 in | Wt 167.0 lb

## 2015-10-20 DIAGNOSIS — R0602 Shortness of breath: Secondary | ICD-10-CM

## 2015-10-20 DIAGNOSIS — I10 Essential (primary) hypertension: Secondary | ICD-10-CM | POA: Diagnosis not present

## 2015-10-20 DIAGNOSIS — I2510 Atherosclerotic heart disease of native coronary artery without angina pectoris: Secondary | ICD-10-CM | POA: Diagnosis not present

## 2015-10-20 DIAGNOSIS — E785 Hyperlipidemia, unspecified: Secondary | ICD-10-CM

## 2015-10-20 DIAGNOSIS — Z139 Encounter for screening, unspecified: Secondary | ICD-10-CM

## 2015-10-20 NOTE — Addendum Note (Signed)
Addended by: Laurine Blazer on: 10/20/2015 02:49 PM   Modules accepted: Orders

## 2015-10-20 NOTE — Progress Notes (Signed)
Clinical Summary Eric Benitez is a 75 y.o.male seen today for follow up of the following medical problems.   1. CAD - hx of prior stenting 10 years ago - cath 04/09/14 with patent coronaries.  - since starting ranexa previously his chest pain has resolved.  - no recent chest pain. But can have some SOB with activities, tends to be more with hot weather.  - compliant with meds  2. HTN - he is compliant with meds    3. Hyperlipidemia - he reports recent labs with pcp   4. SOB - DOE with activities, worst with hot weather.  - former tobacco x 15 years. Never had PFTs    SH: upcoming trip to [place].  Past Medical History:  Diagnosis Date  . Arthritis    "neck, hands, fingers" (04/09/2014)  . Bleeds easily ([place])   . CAD (coronary artery disease)   . Hyperlipemia   . Hypertension   . Kidney stones    "I've had several; had to go in and get them once" (04/09/2014)  . Pneumonia 1940's     No Known Allergies   Current Outpatient Prescriptions  Medication Sig Dispense Refill  . Ascorbic Acid (VITAMIN C) 1000 MG tablet Take 1,000 mg by mouth daily.    Marland Kitchen aspirin 81 MG tablet Take 81 mg by mouth daily.    Marland Kitchen atorvastatin (LIPITOR) 80 MG tablet Take 80 mg by mouth daily.    . cholecalciferol (VITAMIN D) 1000 UNITS tablet Take 1,000 Units by mouth daily.    Marland Kitchen doxazosin (CARDURA) 8 MG tablet Take 8 mg by mouth daily.    . finasteride (PROSCAR) 5 MG tablet Take 5 mg by mouth daily.    Marland Kitchen lisinopril (PRINIVIL,ZESTRIL) 2.5 MG tablet Take 2.5 mg by mouth daily.    Marland Kitchen lisinopril (PRINIVIL,ZESTRIL) 2.5 MG tablet TAKE 1 TABLET BY MOUTH DAILY 90 tablet 3  . metoprolol succinate (TOPROL-XL) 25 MG 24 hr tablet Take 25 mg by mouth daily.    . nitroGLYCERIN (NITROSTAT) 0.4 MG SL tablet Place 1 tablet (0.4 mg total) under the tongue every 5 (five) minutes as needed for chest pain. 90 tablet 3  . Omega-3 Fatty Acids (FISH OIL PO) Take 1 capsule by mouth daily.    . pantoprazole  (PROTONIX) 40 MG tablet TAKE 1 TABLET (40 MG TOTAL) BY MOUTH DAILY. 30 tablet 6  . ranolazine (RANEXA) 500 MG 12 hr tablet Take 1 tablet (500 mg total) by mouth 2 (two) times daily. 180 tablet 3  . vitamin E 400 UNIT capsule Take 400 Units by mouth daily.     No current facility-administered medications for this visit.      Past Surgical History:  Procedure Laterality Date  . ANKLE FRACTURE SURGERY Right 2009  . CARDIAC CATHETERIZATION  1990's X 2;  04/09/2014  . CORONARY ANGIOPLASTY WITH STENT PLACEMENT  2006   "2"  . CYSTOSCOPY W/ STONE MANIPULATION  ~ 2008  . FRACTURE SURGERY    . LEFT HEART CATHETERIZATION WITH CORONARY ANGIOGRAM N/A 04/09/2014   Procedure: LEFT HEART CATHETERIZATION WITH CORONARY ANGIOGRAM;  Surgeon: Leonie Man, MD;  Location: [facility] CATH LAB;  Service: Cardiovascular;  Laterality: N/A;  . NASAL SEPTUM SURGERY  1999     No Known Allergies    Family History  Problem Relation Age of Onset  . Heart disease Mother      Social History Mr. Stjulien reports that he quit smoking about 51 years ago. His  smoking use included Cigarettes. He started smoking about 72 years ago. He has a 33.00 pack-year smoking history. He has never used smokeless tobacco. Mr. Vetrano reports that he drinks alcohol.   Review of Systems CONSTITUTIONAL: No weight loss, fever, chills, weakness or fatigue.  HEENT: Eyes: No visual loss, blurred vision, double vision or yellow sclerae.No hearing loss, sneezing, congestion, runny nose or sore throat.  SKIN: No rash or itching.  CARDIOVASCULAR: per HPI RESPIRATORY: per HPI GASTROINTESTINAL: No anorexia, nausea, vomiting or diarrhea. No abdominal pain or blood.  GENITOURINARY: No burning on urination, no polyuria NEUROLOGICAL: No headache, dizziness, syncope, paralysis, ataxia, numbness or tingling in the extremities. No change in bowel or bladder control.  MUSCULOSKELETAL: No muscle, back pain, joint pain or stiffness.  LYMPHATICS: No enlarged  nodes. No history of splenectomy.  PSYCHIATRIC: No history of depression or anxiety.  ENDOCRINOLOGIC: No reports of sweating, cold or heat intolerance. No polyuria or polydipsia.  Marland Kitchen   Physical Examination Vitals:   10/20/15 0851  BP: 135/73  Pulse: 60   Vitals:   10/20/15 0851  Weight: 167 lb (75.8 kg)  Height: 5\' 9"  (1.753 m)    Gen: resting comfortably, no acute distress HEENT: no scleral icterus, pupils equal round and reactive, no palptable cervical adenopathy,  CV: RRR, no m/r/g, no jvd Resp: Clear to auscultation bilaterally GI: abdomen is soft, non-tender, non-distended, normal bowel sounds, no hepatosplenomegaly MSK: extremities are warm, no edema.  Skin: warm, no rash Neuro:  no focal deficits Psych: appropriate affect   Diagnostic Studies  04/09/14 Cath Hemodynamics:   Central Aortic Pressure / Mean: 126/67/92 mmHg  Left Ventricular Pressure / LVEDP: 128/10/16 mmHg  Left Ventriculography:  EF: 50-65 %  Wall Motion: Normal to hyperdynamic  Coronary Anatomy:  Dominance: Co-dominant  Left Main: Large-caliber vessel that is short. Bifurcates into the LAD and Circumflex. Angiographically normal. LAD: Large-caliber vessel with a very large proximal D1 branch that comes off just prior to a widely patent stent in the early mid vessel. There is a small second diagonal branch that arises from the stented segment and another small third diagonal branch more distally. The distal vessel tapers into a moderate caliber vessel that wraps around the apex perfusing the distal third of the inferoapex.  D1: Large-caliber proximal branch with ostial 40-50% focal stenosis. The vessel then courses almost of the ramus intermedius. The vessel is very tortuous gives rise to several mold-moderate caliber branches distally. Beyond the ostial lesion, the vessel remains angiographically normal.  Left Circumflex: Moderate large-caliber, codominant vessel. There is a small moderate  caliber first obtuse marginal branch before the vessel courses into the AV groove. In the mid AV groove there is a hairpin turn as the vessel courses distally to provide to posterior lateral branches with the first being moderate caliber and second mean small caliber.  OM1: Small moderate caliber branch that does not cover large distribution. Tortuous but free of disease.   RCA: Moderate large-caliber, codominant vessel. Minimal luminal irregularities are vessel tapers down to [place] more moderate caliber right posterior descending arteries. There is a very small posterior lateral system.  After reviewing the initial angiography, no culprit lesion was identified.   Assessment and Plan   1. CAD - no current symptoms - continue current meds - EKG in clinic shows NSR, no ischemic changes   2. HTN - at goal, continue current meds  3. Hyperlipidemia - continue high dose statin in setting of CAD -request pcp labs  4.  AAA screen - male >29 yo with tobacco history, will order AAA [place] screen  5. SOB - tobacco history. Prior CXR showed some emphsymatous changes. Will order PFTs    F/u 1 year     Arnoldo Lenis, M.D., F.A.C.C.

## 2015-10-20 NOTE — Patient Instructions (Signed)
Medication Instructions:  Continue all current medications.  Labwork: none  Testing/Procedures:  Your physician has recommended that you have a pulmonary function test. Pulmonary Function Tests are a group of tests that measure how well air moves in and out of your lungs.  Your physician has requested that you have an abdominal aorta duplex. During this test, an ultrasound is used to evaluate the aorta. Allow 30 minutes for this exam. Do not eat after midnight the day before and avoid carbonated beverages  Office will contact with results via phone or letter.    Follow-Up: Your physician wants you to follow up in:  1 year.  You will receive a reminder letter in the mail one-two months in advance.  If you don't receive a letter, please call our office to schedule the follow up appointment   Any Other Special Instructions Will Be Listed Below (If Applicable).  If you need a refill on your cardiac medications before your next appointment, please call your pharmacy.

## 2015-10-29 ENCOUNTER — Encounter (HOSPITAL_COMMUNITY): Payer: PPO

## 2015-10-29 ENCOUNTER — Ambulatory Visit (HOSPITAL_COMMUNITY)
Admission: RE | Admit: 2015-10-29 | Discharge: 2015-10-29 | Disposition: A | Discharge status: Home / Self Care | Payer: PPO | Source: Ambulatory Visit | Attending: Cardiology | Admitting: Cardiology

## 2015-10-29 DIAGNOSIS — R0602 Shortness of breath: Secondary | ICD-10-CM | POA: Diagnosis not present

## 2015-10-29 LAB — PULMONARY FUNCTION TEST
DL/VA % PRED: 83 %
DL/VA: 3.77 ml/min/mmHg/L
DLCO UNC % PRED: 55 %
DLCO unc: 17.08 ml/min/mmHg
FEF 25-75 POST: 5.41 L/s
FEF 25-75 PRE: 4.57 L/s
FEF2575-%CHANGE-POST: 18 %
FEF2575-%PRED-PRE: 213 %
FEF2575-%Pred-Post: 252 %
FEV1-%Change-Post: 3 %
FEV1-%Pred-Post: 97 %
FEV1-%Pred-Pre: 94 %
FEV1-PRE: 2.77 L
FEV1-Post: 2.86 L
FEV1FVC-%CHANGE-POST: 3 %
FEV1FVC-%Pred-Pre: 122 %
FEV6-%CHANGE-POST: 0 %
FEV6-%Pred-Post: 80 %
FEV6-%Pred-Pre: 81 %
FEV6-Post: 3.08 L
FEV6-Pre: 3.1 L
FEV6FVC-%Pred-Post: 106 %
FEV6FVC-%Pred-Pre: 106 %
FVC-%CHANGE-POST: 0 %
FVC-%Pred-Post: 76 %
FVC-%Pred-Pre: 76 %
FVC-Post: 3.1 L
FVC-Pre: 3.1 L
POST FEV1/FVC RATIO: 92 %
PRE FEV1/FVC RATIO: 89 %
Post FEV6/FVC ratio: 100 %
Pre FEV6/FVC Ratio: 100 %
RV % pred: 94 %
RV: 2.36 L
TLC % PRED: 80 %
TLC: 5.54 L

## 2015-10-29 MED ORDER — ALBUTEROL SULFATE (2.5 MG/3ML) 0.083% IN NEBU
2.5000 mg | INHALATION_SOLUTION | Freq: Once | RESPIRATORY_TRACT | Status: AC
  Administered 2015-10-29: 2.5 mg via RESPIRATORY_TRACT

## 2015-11-03 ENCOUNTER — Telehealth: Payer: Self-pay | Admitting: *Deleted

## 2015-11-03 MED ORDER — ALBUTEROL SULFATE HFA 108 (90 BASE) MCG/ACT IN AERS: 1.0000 | INHALATION_SPRAY | RESPIRATORY_TRACT | 2 refills | Status: DC | PRN

## 2015-11-03 NOTE — Telephone Encounter (Signed)
Pt aware, inhaler sent to pharmacy - routed to pcp

## 2015-11-03 NOTE — Telephone Encounter (Signed)
-----   Message from Laurine Blazer, LPN sent at [id number]  5:20 PM EDT -----   ----- Message ----- From: Drema Dallas, CMA Sent: 11/01/2015   4:51 PM To: Laurine Blazer, LPN  [name]  ----- Message ----- From: Arnoldo Lenis, MD Sent: 11/01/2015  12:28 PM To: Drema Dallas, CMA  Breathing tests show only mild changes, no significant lung disease. He can try albuterol inhaler prn for his SOB to see if improves symptoms.   Zandra Abts MD

## 2015-11-10 ENCOUNTER — Other Ambulatory Visit: Payer: Self-pay | Admitting: Cardiology

## 2015-11-15 ENCOUNTER — Other Ambulatory Visit: Payer: Self-pay | Admitting: Cardiology

## 2015-11-15 DIAGNOSIS — Z136 Encounter for screening for cardiovascular disorders: Secondary | ICD-10-CM

## 2015-12-02 ENCOUNTER — Ambulatory Visit: Payer: PPO

## 2015-12-02 ENCOUNTER — Other Ambulatory Visit: Payer: Self-pay | Admitting: *Deleted

## 2015-12-02 DIAGNOSIS — Z136 Encounter for screening for cardiovascular disorders: Secondary | ICD-10-CM | POA: Diagnosis not present

## 2015-12-02 MED ORDER — RANOLAZINE ER 500 MG PO TB12: 500.0000 mg | ORAL_TABLET | Freq: Two times a day (BID) | ORAL | 0 refills | Status: DC

## 2015-12-09 ENCOUNTER — Telehealth: Payer: Self-pay | Admitting: *Deleted

## 2015-12-09 NOTE — Telephone Encounter (Signed)
-----   Message from Arnoldo Lenis, MD sent at 12/07/2015 11:17 AM EDT ----- Abdominal US looks good, no aneurysm  Zandra Abts MD

## 2015-12-09 NOTE — Telephone Encounter (Signed)
Pt aware - routed to pcp  

## 2015-12-19 DIAGNOSIS — E7800 Pure hypercholesterolemia, unspecified: Secondary | ICD-10-CM | POA: Insufficient documentation

## 2015-12-19 DIAGNOSIS — K2100 Gastro-esophageal reflux disease with esophagitis, without bleeding: Secondary | ICD-10-CM | POA: Insufficient documentation

## 2015-12-20 DIAGNOSIS — E782 Mixed hyperlipidemia: Secondary | ICD-10-CM | POA: Diagnosis not present

## 2015-12-20 DIAGNOSIS — K210 Gastro-esophageal reflux disease with esophagitis: Secondary | ICD-10-CM | POA: Diagnosis not present

## 2015-12-20 DIAGNOSIS — I10 Essential (primary) hypertension: Secondary | ICD-10-CM | POA: Diagnosis not present

## 2015-12-20 DIAGNOSIS — E7800 Pure hypercholesterolemia, unspecified: Secondary | ICD-10-CM | POA: Diagnosis not present

## 2015-12-20 DIAGNOSIS — D649 Anemia, unspecified: Secondary | ICD-10-CM | POA: Diagnosis not present

## 2015-12-21 ENCOUNTER — Other Ambulatory Visit: Payer: Self-pay | Admitting: *Deleted

## 2015-12-21 MED ORDER — RANOLAZINE ER 500 MG PO TB12: 500.0000 mg | ORAL_TABLET | Freq: Two times a day (BID) | ORAL | 0 refills | Status: DC

## 2015-12-23 DIAGNOSIS — Z1212 Encounter for screening for malignant neoplasm of rectum: Secondary | ICD-10-CM | POA: Insufficient documentation

## 2015-12-24 DIAGNOSIS — Z6824 Body mass index (BMI) 24.0-24.9, adult: Secondary | ICD-10-CM | POA: Diagnosis not present

## 2015-12-24 DIAGNOSIS — I25111 Atherosclerotic heart disease of native coronary artery with angina pectoris with documented spasm: Secondary | ICD-10-CM | POA: Diagnosis not present

## 2015-12-24 DIAGNOSIS — I10 Essential (primary) hypertension: Secondary | ICD-10-CM | POA: Diagnosis not present

## 2015-12-24 DIAGNOSIS — E782 Mixed hyperlipidemia: Secondary | ICD-10-CM | POA: Diagnosis not present

## 2015-12-24 DIAGNOSIS — N401 Enlarged prostate with lower urinary tract symptoms: Secondary | ICD-10-CM | POA: Diagnosis not present

## 2015-12-24 DIAGNOSIS — K219 Gastro-esophageal reflux disease without esophagitis: Secondary | ICD-10-CM | POA: Diagnosis not present

## 2015-12-27 ENCOUNTER — Telehealth: Payer: Self-pay | Admitting: Cardiology

## 2015-12-27 NOTE — Telephone Encounter (Signed)
PT aware that form is completed - says they will come by office this afternoon

## 2015-12-27 NOTE — Telephone Encounter (Signed)
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

## 2016-01-17 ENCOUNTER — Telehealth: Payer: Self-pay | Admitting: Cardiology

## 2016-01-17 NOTE — Telephone Encounter (Signed)
[facility] office out of 500 mg Ranexa - Spoke with [employer] office Vilinda Blanks - they will have samples ready - pt says he will come Friday to pick them up.

## 2016-01-17 NOTE — Telephone Encounter (Signed)
Eric Benitez is requesting samples of Ranexa.

## 2016-05-24 ENCOUNTER — Other Ambulatory Visit: Payer: Self-pay | Admitting: Cardiology

## 2016-06-19 DIAGNOSIS — X32XXXA Exposure to sunlight, initial encounter: Secondary | ICD-10-CM | POA: Diagnosis not present

## 2016-06-19 DIAGNOSIS — L570 Actinic keratosis: Secondary | ICD-10-CM | POA: Diagnosis not present

## 2016-06-19 DIAGNOSIS — L02821 Furuncle of head [any part, except face]: Secondary | ICD-10-CM | POA: Diagnosis not present

## 2016-06-19 DIAGNOSIS — D225 Melanocytic nevi of trunk: Secondary | ICD-10-CM | POA: Diagnosis not present

## 2016-06-19 DIAGNOSIS — B9689 Other specified bacterial agents as the cause of diseases classified elsewhere: Secondary | ICD-10-CM | POA: Diagnosis not present

## 2016-06-26 DIAGNOSIS — Z6824 Body mass index (BMI) 24.0-24.9, adult: Secondary | ICD-10-CM | POA: Diagnosis not present

## 2016-06-26 DIAGNOSIS — M25562 Pain in left knee: Secondary | ICD-10-CM | POA: Diagnosis not present

## 2016-06-29 DIAGNOSIS — M23329 Other meniscus derangements, posterior horn of medial meniscus, unspecified knee: Secondary | ICD-10-CM | POA: Insufficient documentation

## 2016-06-29 DIAGNOSIS — M25462 Effusion, left knee: Secondary | ICD-10-CM | POA: Diagnosis not present

## 2016-06-29 DIAGNOSIS — M23352 Other meniscus derangements, posterior horn of lateral meniscus, left knee: Secondary | ICD-10-CM | POA: Diagnosis not present

## 2016-06-29 DIAGNOSIS — Z6824 Body mass index (BMI) 24.0-24.9, adult: Secondary | ICD-10-CM | POA: Diagnosis not present

## 2016-07-17 DIAGNOSIS — K210 Gastro-esophageal reflux disease with esophagitis: Secondary | ICD-10-CM | POA: Diagnosis not present

## 2016-07-17 DIAGNOSIS — Z9189 Other specified personal risk factors, not elsewhere classified: Secondary | ICD-10-CM | POA: Diagnosis not present

## 2016-07-17 DIAGNOSIS — E7800 Pure hypercholesterolemia, unspecified: Secondary | ICD-10-CM | POA: Diagnosis not present

## 2016-07-17 DIAGNOSIS — Z0001 Encounter for general adult medical examination with abnormal findings: Secondary | ICD-10-CM | POA: Diagnosis not present

## 2016-07-17 DIAGNOSIS — Z6824 Body mass index (BMI) 24.0-24.9, adult: Secondary | ICD-10-CM | POA: Diagnosis not present

## 2016-07-17 DIAGNOSIS — M23352 Other meniscus derangements, posterior horn of lateral meniscus, left knee: Secondary | ICD-10-CM | POA: Diagnosis not present

## 2016-07-17 DIAGNOSIS — E782 Mixed hyperlipidemia: Secondary | ICD-10-CM | POA: Diagnosis not present

## 2016-07-17 DIAGNOSIS — I10 Essential (primary) hypertension: Secondary | ICD-10-CM | POA: Diagnosis not present

## 2016-07-17 DIAGNOSIS — Z6823 Body mass index (BMI) 23.0-23.9, adult: Secondary | ICD-10-CM | POA: Diagnosis not present

## 2016-07-17 DIAGNOSIS — R5383 Other fatigue: Secondary | ICD-10-CM | POA: Diagnosis not present

## 2016-07-17 DIAGNOSIS — M25462 Effusion, left knee: Secondary | ICD-10-CM | POA: Diagnosis not present

## 2016-07-21 DIAGNOSIS — I25111 Atherosclerotic heart disease of native coronary artery with angina pectoris with documented spasm: Secondary | ICD-10-CM | POA: Diagnosis not present

## 2016-07-21 DIAGNOSIS — I10 Essential (primary) hypertension: Secondary | ICD-10-CM | POA: Diagnosis not present

## 2016-07-21 DIAGNOSIS — Z6823 Body mass index (BMI) 23.0-23.9, adult: Secondary | ICD-10-CM | POA: Diagnosis not present

## 2016-07-21 DIAGNOSIS — E782 Mixed hyperlipidemia: Secondary | ICD-10-CM | POA: Diagnosis not present

## 2016-07-21 DIAGNOSIS — K219 Gastro-esophageal reflux disease without esophagitis: Secondary | ICD-10-CM | POA: Diagnosis not present

## 2016-07-21 DIAGNOSIS — Z1212 Encounter for screening for malignant neoplasm of rectum: Secondary | ICD-10-CM | POA: Diagnosis not present

## 2016-07-21 DIAGNOSIS — N401 Enlarged prostate with lower urinary tract symptoms: Secondary | ICD-10-CM | POA: Diagnosis not present

## 2016-07-21 DIAGNOSIS — Z0001 Encounter for general adult medical examination with abnormal findings: Secondary | ICD-10-CM | POA: Diagnosis not present

## 2016-07-25 DIAGNOSIS — M1712 Unilateral primary osteoarthritis, left knee: Secondary | ICD-10-CM | POA: Diagnosis not present

## 2016-07-25 DIAGNOSIS — S83242A Other tear of medial meniscus, current injury, left knee, initial encounter: Secondary | ICD-10-CM | POA: Diagnosis not present

## 2016-07-25 DIAGNOSIS — M25562 Pain in left knee: Secondary | ICD-10-CM | POA: Diagnosis not present

## 2016-07-25 DIAGNOSIS — X501XXA Overexertion from prolonged static or awkward postures, initial encounter: Secondary | ICD-10-CM | POA: Diagnosis not present

## 2016-07-25 DIAGNOSIS — M23352 Other meniscus derangements, posterior horn of lateral meniscus, left knee: Secondary | ICD-10-CM | POA: Diagnosis not present

## 2016-08-01 DIAGNOSIS — M1712 Unilateral primary osteoarthritis, left knee: Secondary | ICD-10-CM | POA: Diagnosis not present

## 2016-08-01 DIAGNOSIS — S83242A Other tear of medial meniscus, current injury, left knee, initial encounter: Secondary | ICD-10-CM | POA: Diagnosis not present

## 2016-08-02 ENCOUNTER — Telehealth: Payer: Self-pay | Admitting: *Deleted

## 2016-08-02 NOTE — Telephone Encounter (Signed)
Surgical clearance received from [facility] and placed on Dr. Nelly Laurence desk.

## 2016-08-23 ENCOUNTER — Other Ambulatory Visit: Payer: Self-pay | Admitting: Cardiology

## 2016-08-23 DIAGNOSIS — Y999 Unspecified external cause status: Secondary | ICD-10-CM | POA: Diagnosis not present

## 2016-08-23 DIAGNOSIS — M659 Synovitis and tenosynovitis, unspecified: Secondary | ICD-10-CM | POA: Diagnosis not present

## 2016-08-23 DIAGNOSIS — S83282A Other tear of lateral meniscus, current injury, left knee, initial encounter: Secondary | ICD-10-CM | POA: Diagnosis not present

## 2016-08-23 DIAGNOSIS — S83262A Peripheral tear of lateral meniscus, current injury, left knee, initial encounter: Secondary | ICD-10-CM | POA: Diagnosis not present

## 2016-08-23 DIAGNOSIS — G8918 Other acute postprocedural pain: Secondary | ICD-10-CM | POA: Diagnosis not present

## 2016-08-23 DIAGNOSIS — M94262 Chondromalacia, left knee: Secondary | ICD-10-CM | POA: Diagnosis not present

## 2016-08-23 DIAGNOSIS — S83232A Complex tear of medial meniscus, current injury, left knee, initial encounter: Secondary | ICD-10-CM | POA: Diagnosis not present

## 2016-08-23 DIAGNOSIS — S83242A Other tear of medial meniscus, current injury, left knee, initial encounter: Secondary | ICD-10-CM | POA: Diagnosis not present

## 2016-10-19 ENCOUNTER — Encounter: Payer: Self-pay | Admitting: *Deleted

## 2016-10-19 ENCOUNTER — Encounter: Payer: Self-pay | Admitting: Cardiology

## 2016-10-19 ENCOUNTER — Ambulatory Visit (INDEPENDENT_AMBULATORY_CARE_PROVIDER_SITE_OTHER): Payer: PPO | Admitting: Cardiology

## 2016-10-19 VITALS — BP 130/80 | HR 61 | Ht 69.0 in | Wt 165.0 lb

## 2016-10-19 DIAGNOSIS — I10 Essential (primary) hypertension: Secondary | ICD-10-CM | POA: Diagnosis not present

## 2016-10-19 DIAGNOSIS — R0602 Shortness of breath: Secondary | ICD-10-CM | POA: Diagnosis not present

## 2016-10-19 DIAGNOSIS — E782 Mixed hyperlipidemia: Secondary | ICD-10-CM | POA: Diagnosis not present

## 2016-10-19 DIAGNOSIS — I2510 Atherosclerotic heart disease of native coronary artery without angina pectoris: Secondary | ICD-10-CM

## 2016-10-19 NOTE — Progress Notes (Signed)
Clinical Summary Eric Benitez is a 76 y.o.male seen today for follow up of the following medical problems.   1. CAD - hx of prior stenting 10 years ago - cath 04/09/14 with patent coronaries.  - since starting ranexa previously his chest pain has resolved.  - no recent chest pain. But can have some SOB with activities, tends to be more with hot weather.  - compliant with meds  - no recent chest pain. No SOB or DOE - compliant with meds. Ranexa is expensive.     2. HTN - takes his bp meds daily   3. Hyperlipidemia - he reports recent labs with pcp - he is compliant with his statin  4. SOB - 10/2015 PFTs mild ventilatory defect - reports symptoms are improved since last visit    SH: upcoming trip to [place].    Past Medical History:  Diagnosis Date  . Arthritis    "neck, hands, fingers" (04/09/2014)  . Bleeds easily ([place])   . CAD (coronary artery disease)   . Hyperlipemia   . Hypertension   . Kidney stones    "I've had several; had to go in and get them once" (04/09/2014)  . Pneumonia 1940's     No Known Allergies   Current Outpatient Prescriptions  Medication Sig Dispense Refill  . albuterol (PROVENTIL HFA;VENTOLIN HFA) 108 (90 Base) MCG/ACT inhaler Inhale 1 puff into the lungs as needed for wheezing or shortness of breath. 1 Inhaler 2  . aspirin 81 MG tablet Take 81 mg by mouth daily.    Marland Kitchen atorvastatin (LIPITOR) 80 MG tablet Take 80 mg by mouth daily.    Marland Kitchen doxazosin (CARDURA) 8 MG tablet Take 8 mg by mouth daily.    . finasteride (PROSCAR) 5 MG tablet Take 1 tablet by mouth daily.    Marland Kitchen lisinopril (PRINIVIL,ZESTRIL) 2.5 MG tablet TAKE 1 TABLET BY MOUTH DAILY 90 tablet 1  . metoprolol succinate (TOPROL-XL) 25 MG 24 hr tablet Take 25 mg by mouth daily.    . Multiple Vitamin (MULTIVITAMIN) tablet Take 1 tablet by mouth daily.    . nitroGLYCERIN (NITROSTAT) 0.4 MG SL tablet Place 1 tablet (0.4 mg total) under the tongue every 5 (five) minutes as needed  for chest pain. 90 tablet 3  . pantoprazole (PROTONIX) 40 MG tablet TAKE 1 TABLET BY MOUTH DAILY 30 tablet 6  . RANEXA 500 MG 12 hr tablet TAKE 1 TABLET (500 MG TOTAL) BY MOUTH 2 (TWO) TIMES DAILY. 180 tablet 3  . ranolazine (RANEXA) 500 MG 12 hr tablet Take 1 tablet (500 mg total) by mouth 2 (two) times daily. 42 tablet 0   No current facility-administered medications for this visit.      Past Surgical History:  Procedure Laterality Date  . ANKLE FRACTURE SURGERY Right 2009  . CARDIAC CATHETERIZATION  1990's X 2;  04/09/2014  . CORONARY ANGIOPLASTY WITH STENT PLACEMENT  2006   "2"  . CYSTOSCOPY W/ STONE MANIPULATION  ~ 2008  . FRACTURE SURGERY    . LEFT HEART CATHETERIZATION WITH CORONARY ANGIOGRAM N/A 04/09/2014   Procedure: LEFT HEART CATHETERIZATION WITH CORONARY ANGIOGRAM;  Surgeon: Leonie Man, MD;  Location: [facility] CATH LAB;  Service: Cardiovascular;  Laterality: N/A;  . NASAL SEPTUM SURGERY  1999     No Known Allergies    Family History  Problem Relation Age of Onset  . Heart disease Mother      Social History Eric Benitez reports that he quit  smoking about 52 years ago. His smoking use included Cigarettes. He started smoking about 73 years ago. He has a 33.00 pack-year smoking history. He has never used smokeless tobacco. Eric Benitez reports that he drinks alcohol.   Review of Systems CONSTITUTIONAL: No weight loss, fever, chills, weakness or fatigue.  HEENT: Eyes: No visual loss, blurred vision, double vision or yellow sclerae.No hearing loss, sneezing, congestion, runny nose or sore throat.  SKIN: No rash or itching.  CARDIOVASCULAR: per hpi RESPIRATORY: per hpi GASTROINTESTINAL: No anorexia, nausea, vomiting or diarrhea. No abdominal pain or blood.  GENITOURINARY: No burning on urination, no polyuria NEUROLOGICAL: No headache, dizziness, syncope, paralysis, ataxia, numbness or tingling in the extremities. No change in bowel or bladder control.  MUSCULOSKELETAL: No  muscle, back pain, joint pain or stiffness.  LYMPHATICS: No enlarged nodes. No history of splenectomy.  PSYCHIATRIC: No history of depression or anxiety.  ENDOCRINOLOGIC: No reports of sweating, cold or heat intolerance. No polyuria or polydipsia.  Marland Kitchen   Physical Examination Vitals:   10/19/16 0950  BP: 130/80  Pulse: 61  SpO2: 97%   Vitals:   10/19/16 0950  Weight: 165 lb (74.8 kg)  Height: 5\' 9"  (1.753 m)    Gen: resting comfortably, no acute distress HEENT: no scleral icterus, pupils equal round and reactive, no palptable cervical adenopathy,  CV: RRR, no m/r/g, no jvd Resp: Clear to auscultation bilaterally GI: abdomen is soft, non-tender, non-distended, normal bowel sounds, no hepatosplenomegaly MSK: extremities are warm, no edema.  Skin: warm, no rash Neuro:  no focal deficits Psych: appropriate affect   Diagnostic Studies 04/09/14 Cath Hemodynamics:  Central Aortic Pressure / Mean: 126/67/92 mmHg  Left Ventricular Pressure / LVEDP: 128/10/16 mmHg  Left Ventriculography:  EF:50-65 %  Wall Motion:Normal to hyperdynamic  Coronary Anatomy:  Dominance: Co-dominant  Left Main: Large-caliber vessel that is short. Bifurcates into the LAD and Circumflex. Angiographically normal. DDU:[id number] vessel with a very large proximal D1 Omayra Tulloch that comes off just prior to a widely patent stent in the early mid vessel. There is a small second diagonal Dewaun Kinzler that arises from the stented segment and another small third diagonal Nicoles Sedlacek more distally. The distal vessel tapers into a moderate caliber vessel that wraps around the apex perfusing the distal third of the inferoapex.  D1:Large-caliber proximal Brenin Heidelberger with ostial 40-50% focal stenosis. The vessel then courses almost of the ramus intermedius. The vessel is very tortuous gives rise to several mold-moderate caliber branches distally. Beyond the ostial lesion, the vessel remains angiographically normal.  Left  Circumflex:Moderate large-caliber, codominant vessel. There is a small moderate caliber first obtuse marginal Hicks Feick before the vessel courses into the AV groove. In the mid AV groove there is a hairpin turn as the vessel courses distally to provide to posterior lateral branches with the first being moderate caliber and second mean small caliber.  OM1:Small moderate caliber Wylan Gentzler that does not cover large distribution. Tortuous but free of disease.   RCA: Moderate large-caliber, codominant vessel. Minimal luminal irregularities are vessel tapers down to [place] more moderate caliber right posterior descending arteries. There is a very small posterior lateral system.  After reviewing the initial angiography, no culprit lesion was identified.   10/2015 PFTs Mild ventilatory defect  11/2015 AAA screen [place] No aneurysm   Assessment and Plan  1. CAD - no significant symptoms - continue current meds   2. HTN - bp is at goal, continue current meds  3. Hyperlipidemia - we will request labs from pcp,  continue current statin  4. AAA screen - negative [place] 11/2015  5. SOB - symptoms improved. Continue to monitor. Consider additional COPD therapy if symptoms progress       Arnoldo Lenis, M.D.

## 2016-10-19 NOTE — Patient Instructions (Signed)
Your physician wants you to follow-up in: [place] will receive a reminder letter in the mail two months in advance. If you don't receive a letter, please call our office to schedule the follow-up appointment.  Your physician has recommended you make the following change in your medication:   STOP RANEXA   Thank you for choosing [facility]!!

## 2016-11-13 ENCOUNTER — Encounter: Payer: Self-pay | Admitting: Gastroenterology

## 2016-11-13 DIAGNOSIS — Z23 Encounter for immunization: Secondary | ICD-10-CM | POA: Diagnosis not present

## 2016-11-17 ENCOUNTER — Other Ambulatory Visit: Payer: Self-pay | Admitting: Cardiology

## 2016-12-22 ENCOUNTER — Encounter: Payer: Self-pay | Admitting: Gastroenterology

## 2016-12-26 ENCOUNTER — Other Ambulatory Visit: Payer: Self-pay | Admitting: *Deleted

## 2016-12-26 ENCOUNTER — Ambulatory Visit: Payer: PPO | Admitting: Nurse Practitioner

## 2016-12-26 ENCOUNTER — Encounter: Payer: Self-pay | Admitting: Nurse Practitioner

## 2016-12-26 ENCOUNTER — Telehealth: Payer: Self-pay | Admitting: *Deleted

## 2016-12-26 ENCOUNTER — Encounter: Payer: Self-pay | Admitting: *Deleted

## 2016-12-26 DIAGNOSIS — R195 Other fecal abnormalities: Secondary | ICD-10-CM | POA: Diagnosis not present

## 2016-12-26 DIAGNOSIS — K219 Gastro-esophageal reflux disease without esophagitis: Secondary | ICD-10-CM | POA: Diagnosis not present

## 2016-12-26 DIAGNOSIS — K5900 Constipation, unspecified: Secondary | ICD-10-CM

## 2016-12-26 MED ORDER — NA SULFATE-K SULFATE-MG SULF 17.5-3.13-1.6 GM/177ML PO SOLN
1.0000 | ORAL | 0 refills | Status: DC
Start: 2016-12-26 — End: 2017-01-22

## 2016-12-26 MED ORDER — PEG 3350-KCL-NA BICARB-NACL 420 G PO SOLR: 4000.0000 mL | Freq: Once | ORAL | 0 refills | Status: AC

## 2016-12-26 NOTE — Telephone Encounter (Signed)
Received fax from pharmacy that [employer] is denying trilyte. They will cover suprep. I have sent in new RX. I also made patient aware and I have mailed him new prep instructions. Nothing further needed

## 2016-12-26 NOTE — Progress Notes (Signed)
cc'ed to pcp °

## 2016-12-26 NOTE — Assessment & Plan Note (Signed)
He has a chronic history of constipation which is well controlled on over-the-counter laxative, daily use.  Denies any hematochezia.  Heme positive stool as per below.  If after endoscopic evaluation it is deemed his bleeding is likely from hemorrhoids/benign anorectal source possibly linked to constipation we can consider changing his laxative to something such as Linzess or Amitiza.  Follow-up based on postprocedure recommendations.

## 2016-12-26 NOTE — Assessment & Plan Note (Signed)
The patient has heme positive stool without obvious GI bleeding.  He does have a chronic history of GERD.  He states this is well controlled on Protonix.  Denies NSAIDs and aspirin powders.  Given the nonspecific heme positive stool without obvious bleeding we will add possible endoscopy onto his colonoscopy, as per below.  Return for follow-up based on post procedure recommendations.

## 2016-12-26 NOTE — Assessment & Plan Note (Signed)
Noted heme positive stool per primary care.  No obvious hematochezia or melena per the patient.  At this point given the nonspecific source with no obvious upper or lower symptoms we will plan for colonoscopy with possible upper endoscopy.  Return for follow-up based on post procedure recommendations.  His last colonoscopy was 10 years ago and normal per the patient.  We will request these records for further evaluation and scanning into the system.  Proceed with colonoscopy +/- EGD with Dr. Oneida Alar in the near future. The risks, benefits, and alternatives have been discussed in detail with the patient. They state understanding and desire to proceed.   Is not on any anticoagulants, anxiolytics, chronic pain medications, or antidepressants.  Conscious sedation should be adequate for his procedure.

## 2016-12-26 NOTE — Patient Instructions (Addendum)
1. We will schedule your procedures for you. 2. We will request her previous colonoscopy from [facility]. 3. Return for follow-up based on recommendations made after your procedure. 4. Call us if you have any questions or concerns.    Happy Thanksgiving!!!

## 2016-12-26 NOTE — Progress Notes (Addendum)
REVIEWED-NO ADDITIONAL RECOMMENDATIONS.  Primary Care Physician:  Caryl Bis, MD Primary Gastroenterologist:  Dr. Oneida Alar  Chief Complaint  Patient presents with  . heme+ stool    last tcs 5-6 yrs ago  . Constipation    HPI:   Eric Benitez is a 76 y.o. male who presents on referral from primary care for heme positive stool.  PCP notes reviewed, labs reviewed.  Labs found to be essentially normal including a hemoglobin of 13.3.  Hemoccult test was positive on well exam.  Per PCP notes, patient last colonoscopy 2018.  He was also seen for GERD by his primary care provider.  Today he states he's doing well overall. Last colonoscopy 2008 in [place] at [facility]. He has not seen any hematochezia, melena. He was heme+ on stool test. Occasional lower abdominal pain in the mornings until he has a bowel movement. He is currently on Senna. Has had chronic constipation. No straining or hard stools when he is taking the laxative. Has a history of GERD, does well on PPI (Protonix) which controls his symptoms well. Denies NSAIDs (only uses Tylenol), denies ASA powders. He eats lots of spicy foods. Denies N/V, fever, chills, unintentional weight loss, acute changes in bowel habits or consistency. Denies chest pain, dyspnea, dizziness, lightheadedness, syncope, near syncope. Denies any other upper or lower GI symptoms.  Past Medical History:  Diagnosis Date  . Arthritis    "neck, hands, fingers" (04/09/2014)  . Bleeds easily ([place])   . CAD (coronary artery disease)   . Hyperlipemia   . Hypertension   . Kidney stones    "I've had several; had to go in and get them once" (04/09/2014)  . Pneumonia 1940's    Past Surgical History:  Procedure Laterality Date  . ANKLE FRACTURE SURGERY Right 2009  . CARDIAC CATHETERIZATION  1990's X 2;  04/09/2014  . CORONARY ANGIOPLASTY WITH STENT PLACEMENT  2006   "2"  . CYSTOSCOPY W/ STONE MANIPULATION  ~ 2008  . FRACTURE SURGERY    . LEFT HEART CATHETERIZATION  WITH CORONARY ANGIOGRAM N/A 04/09/2014   Performed by Leonie Man, MD at [facility] CATH LAB  . NASAL SEPTUM SURGERY  1999    Current Outpatient Medications  Medication Sig Dispense Refill  . albuterol (PROVENTIL HFA;VENTOLIN HFA) 108 (90 Base) MCG/ACT inhaler Inhale 1 puff into the lungs as needed for wheezing or shortness of breath. 1 Inhaler 2  . aspirin 81 MG tablet Take 81 mg by mouth daily.    Marland Kitchen atorvastatin (LIPITOR) 80 MG tablet Take 80 mg by mouth daily.    Marland Kitchen doxazosin (CARDURA) 8 MG tablet Take 8 mg by mouth daily.    . finasteride (PROSCAR) 5 MG tablet Take 1 tablet by mouth daily.    Marland Kitchen lisinopril (PRINIVIL,ZESTRIL) 2.5 MG tablet TAKE 1 TABLET BY MOUTH DAILY 90 tablet 1  . metoprolol succinate (TOPROL-XL) 25 MG 24 hr tablet Take 25 mg by mouth daily.    . Multiple Vitamin (MULTIVITAMIN) tablet Take 1 tablet by mouth daily.    . nitroGLYCERIN (NITROSTAT) 0.4 MG SL tablet Place 1 tablet (0.4 mg total) under the tongue every 5 (five) minutes as needed for chest pain. 90 tablet 3  . pantoprazole (PROTONIX) 40 MG tablet TAKE 1 TABLET BY MOUTH DAILY 30 tablet 6  . senna (SENOKOT) 8.6 MG tablet Take 2-3 tablets at bedtime by mouth.     No current facility-administered medications for this visit.     Allergies as of 12/26/2016  . (  No Known Allergies)    Family History  Problem Relation Age of Onset  . Heart disease Mother   . Colon cancer Neg Hx   . Gastric cancer Neg Hx   . Esophageal cancer Neg Hx     Social History   Socioeconomic History  . Marital status: Married    Spouse name: Not on file  . Number of children: Not on file  . Years of education: Not on file  . Highest education level: Not on file  Social Needs  . Financial resource strain: Not on file  . Food insecurity - worry: Not on file  . Food insecurity - inability: Not on file  . Transportation needs - medical: Not on file  . Transportation needs - non-medical: Not on file  Occupational History  . Not on  file  Tobacco Use  . Smoking status: Former Smoker    Packs/day: 1.50    Years: 22.00    Pack years: 33.00    Types: Cigarettes    Start date: 11/28/1942    Last attempt to quit: 02/07/1964    Years since quitting: 52.9  . Smokeless tobacco: Never Used  Substance and Sexual Activity  . Alcohol use: No    Alcohol/week: 0.0 oz    Frequency: Never  . Drug use: No  . Sexual activity: Yes  Other Topics Concern  . Not on file  Social History Narrative  . Not on file    Review of Systems: Complete ROS negative except as per HPI.    Physical Exam: BP (!) 147/76   Pulse 60   Temp (!) 97.4 F (36.3 C) (Oral)   Ht 5\' 9"  (1.753 m)   Wt 168 lb 9.6 oz (76.5 kg)   BMI 24.90 kg/m  General:   Alert and oriented. Pleasant and cooperative. Well-nourished and well-developed.  Eyes:  Without icterus, sclera clear and conjunctiva pink.  Ears:  Normal auditory acuity. Cardiovascular:  S1, S2 present without murmurs appreciated. Extremities without clubbing or edema. Respiratory:  Clear to auscultation bilaterally. No wheezes, rales, or rhonchi. No distress.  Gastrointestinal:  +BS, soft, non-tender and non-distended. No HSM noted. No guarding or rebound. No masses appreciated.  Rectal:  Deferred  Musculoskalatal:  Symmetrical without gross deformities. Neurologic:  Alert and oriented x4;  grossly normal neurologically. Psych:  Alert and cooperative. Normal mood and affect. Heme/Lymph/Immune: No excessive bruising noted.    12/26/2016 8:29 AM   Disclaimer: This note was dictated with voice recognition software. Similar sounding words can inadvertently be transcribed and may not be corrected upon review.

## 2017-01-19 ENCOUNTER — Telehealth: Payer: Self-pay

## 2017-01-19 DIAGNOSIS — K219 Gastro-esophageal reflux disease without esophagitis: Secondary | ICD-10-CM | POA: Diagnosis not present

## 2017-01-19 DIAGNOSIS — Z1212 Encounter for screening for malignant neoplasm of rectum: Secondary | ICD-10-CM | POA: Diagnosis not present

## 2017-01-19 DIAGNOSIS — I25111 Atherosclerotic heart disease of native coronary artery with angina pectoris with documented spasm: Secondary | ICD-10-CM | POA: Diagnosis not present

## 2017-01-19 DIAGNOSIS — I10 Essential (primary) hypertension: Secondary | ICD-10-CM | POA: Diagnosis not present

## 2017-01-19 DIAGNOSIS — N401 Enlarged prostate with lower urinary tract symptoms: Secondary | ICD-10-CM | POA: Diagnosis not present

## 2017-01-19 DIAGNOSIS — Z6824 Body mass index (BMI) 24.0-24.9, adult: Secondary | ICD-10-CM | POA: Diagnosis not present

## 2017-01-19 DIAGNOSIS — E782 Mixed hyperlipidemia: Secondary | ICD-10-CM | POA: Diagnosis not present

## 2017-01-19 NOTE — Telephone Encounter (Signed)
Tried to call pt to see if he can arrive earlier 01/22/17 for procedure, no answer.

## 2017-01-22 ENCOUNTER — Other Ambulatory Visit: Payer: Self-pay

## 2017-01-22 ENCOUNTER — Telehealth: Payer: Self-pay

## 2017-01-22 ENCOUNTER — Encounter: Payer: Self-pay | Admitting: Gastroenterology

## 2017-01-22 ENCOUNTER — Encounter (HOSPITAL_COMMUNITY)
Admission: RE | Disposition: A | Discharge status: Home / Self Care | Payer: Self-pay | Source: Ambulatory Visit | Attending: Gastroenterology

## 2017-01-22 ENCOUNTER — Ambulatory Visit (HOSPITAL_COMMUNITY)
Admission: RE | Admit: 2017-01-22 | Discharge: 2017-01-22 | Disposition: A | Discharge status: Home / Self Care | Payer: PPO | Source: Ambulatory Visit | Attending: Gastroenterology | Admitting: Gastroenterology

## 2017-01-22 ENCOUNTER — Encounter (HOSPITAL_COMMUNITY): Payer: Self-pay | Admitting: *Deleted

## 2017-01-22 DIAGNOSIS — E785 Hyperlipidemia, unspecified: Secondary | ICD-10-CM | POA: Insufficient documentation

## 2017-01-22 DIAGNOSIS — K449 Diaphragmatic hernia without obstruction or gangrene: Secondary | ICD-10-CM | POA: Diagnosis not present

## 2017-01-22 DIAGNOSIS — D123 Benign neoplasm of transverse colon: Secondary | ICD-10-CM | POA: Insufficient documentation

## 2017-01-22 DIAGNOSIS — K2970 Gastritis, unspecified, without bleeding: Secondary | ICD-10-CM | POA: Insufficient documentation

## 2017-01-22 DIAGNOSIS — Z79899 Other long term (current) drug therapy: Secondary | ICD-10-CM | POA: Insufficient documentation

## 2017-01-22 DIAGNOSIS — Z87891 Personal history of nicotine dependence: Secondary | ICD-10-CM | POA: Diagnosis not present

## 2017-01-22 DIAGNOSIS — Z7982 Long term (current) use of aspirin: Secondary | ICD-10-CM | POA: Diagnosis not present

## 2017-01-22 DIAGNOSIS — D122 Benign neoplasm of ascending colon: Secondary | ICD-10-CM | POA: Insufficient documentation

## 2017-01-22 DIAGNOSIS — I2510 Atherosclerotic heart disease of native coronary artery without angina pectoris: Secondary | ICD-10-CM | POA: Insufficient documentation

## 2017-01-22 DIAGNOSIS — K644 Residual hemorrhoidal skin tags: Secondary | ICD-10-CM | POA: Insufficient documentation

## 2017-01-22 DIAGNOSIS — R195 Other fecal abnormalities: Secondary | ICD-10-CM

## 2017-01-22 DIAGNOSIS — I10 Essential (primary) hypertension: Secondary | ICD-10-CM | POA: Insufficient documentation

## 2017-01-22 DIAGNOSIS — Z955 Presence of coronary angioplasty implant and graft: Secondary | ICD-10-CM | POA: Insufficient documentation

## 2017-01-22 DIAGNOSIS — Z87442 Personal history of urinary calculi: Secondary | ICD-10-CM | POA: Diagnosis not present

## 2017-01-22 DIAGNOSIS — Q438 Other specified congenital malformations of intestine: Secondary | ICD-10-CM | POA: Diagnosis not present

## 2017-01-22 DIAGNOSIS — K219 Gastro-esophageal reflux disease without esophagitis: Secondary | ICD-10-CM | POA: Insufficient documentation

## 2017-01-22 DIAGNOSIS — K2951 Unspecified chronic gastritis with bleeding: Secondary | ICD-10-CM | POA: Diagnosis not present

## 2017-01-22 HISTORY — PX: ESOPHAGOGASTRODUODENOSCOPY: SHX5428

## 2017-01-22 HISTORY — PX: POLYPECTOMY: SHX5525

## 2017-01-22 HISTORY — PX: COLONOSCOPY: SHX5424

## 2017-01-22 SURGERY — COLONOSCOPY
Anesthesia: Moderate Sedation

## 2017-01-22 MED ORDER — MEPERIDINE HCL 100 MG/ML IJ SOLN
INTRAMUSCULAR | Status: AC
  Filled 2017-01-22: qty 2

## 2017-01-22 MED ORDER — MEPERIDINE HCL 100 MG/ML IJ SOLN
INTRAMUSCULAR | Status: DC | PRN
  Administered 2017-01-22 (×2): 25 mg via INTRAVENOUS

## 2017-01-22 MED ORDER — LIDOCAINE VISCOUS 2 % MT SOLN
OROMUCOSAL | Status: AC
  Filled 2017-01-22: qty 15

## 2017-01-22 MED ORDER — SODIUM CHLORIDE 0.9 % IV SOLN
INTRAVENOUS | Status: DC
  Administered 2017-01-22: 10:00:00 via INTRAVENOUS

## 2017-01-22 MED ORDER — MIDAZOLAM HCL 5 MG/5ML IJ SOLN
INTRAMUSCULAR | Status: DC | PRN
  Administered 2017-01-22: 1 mg via INTRAVENOUS
  Administered 2017-01-22 (×2): 2 mg via INTRAVENOUS

## 2017-01-22 MED ORDER — MIDAZOLAM HCL 5 MG/5ML IJ SOLN
INTRAMUSCULAR | Status: AC
  Filled 2017-01-22: qty 10

## 2017-01-22 MED ORDER — SIMETHICONE 40 MG/0.6ML PO SUSP
ORAL | Status: DC | PRN
  Administered 2017-01-22: 2.5 mL

## 2017-01-22 MED ORDER — MEPERIDINE HCL 50 MG/ML IJ SOLN
INTRAMUSCULAR | Status: DC | PRN
  Administered 2017-01-22: 25 mg via INTRAVENOUS

## 2017-01-22 NOTE — Telephone Encounter (Signed)
PATIENT SCHEDULED  °

## 2017-01-22 NOTE — H&P (Addendum)
Primary Care Physician:  Caryl Bis, MD Primary Gastroenterologist:  Dr. Oneida Alar  Pre-Procedure History & Physical: HPI:  Eric Benitez is a 76 y.o. male here for HEME POS STOOLS.    Past Medical History:  Diagnosis Date  . Arthritis    "neck, hands, fingers" (04/09/2014)  . Bleeds easily ([place])   . CAD (coronary artery disease)   . Hyperlipemia   . Hypertension   . Kidney stones    "I've had several; had to go in and get them once" (04/09/2014)  . Pneumonia 1940's    Past Surgical History:  Procedure Laterality Date  . ANKLE FRACTURE SURGERY Right 2009  . CARDIAC CATHETERIZATION  1990's X 2;  04/09/2014  . CORONARY ANGIOPLASTY WITH STENT PLACEMENT  2006   "2"  . CYSTOSCOPY W/ STONE MANIPULATION  ~ 2008  . FRACTURE SURGERY    . LEFT HEART CATHETERIZATION WITH CORONARY ANGIOGRAM N/A 04/09/2014   Procedure: LEFT HEART CATHETERIZATION WITH CORONARY ANGIOGRAM;  Surgeon: Leonie Man, MD;  Location: [facility] CATH LAB;  Service: Cardiovascular;  Laterality: N/A;  . NASAL SEPTUM SURGERY  1999    Prior to Admission medications   Medication Sig Start Date End Date Taking? Authorizing Provider  acetaminophen (TYLENOL) 500 MG tablet Take 1,000 mg by mouth every 6 (six) hours as needed for moderate pain or headache.   Yes [provider]  albuterol (PROVENTIL HFA;VENTOLIN HFA) 108 (90 Base) MCG/ACT inhaler Inhale 1 puff into the lungs as needed for wheezing or shortness of breath. Patient taking differently: Inhale 1 puff into the lungs every 6 (six) hours as needed for wheezing or shortness of breath.  11/03/15  Yes BranchAlphonse Guild, MD  aspirin 81 MG tablet Take 81 mg by mouth daily.   Yes [provider]  atorvastatin (LIPITOR) 80 MG tablet Take 80 mg by mouth at bedtime.    Yes [provider]  Cholecalciferol (VITAMIN D3 PO) Take 1 capsule by mouth daily.   Yes [provider]  diphenhydramine-acetaminophen (TYLENOL PM) 25-500 MG TABS tablet Take 2  tablets by mouth at bedtime.   Yes [provider]  doxazosin (CARDURA) 8 MG tablet Take 8 mg by mouth at bedtime.    Yes [provider]  metoprolol succinate (TOPROL-XL) 25 MG 24 hr tablet Take 25 mg by mouth daily.   Yes [provider]  Multiple Vitamin (MULTIVITAMIN) tablet Take 1 tablet by mouth daily.   Yes [provider]  Na Sulfate-K Sulfate-Mg Sulf 17.5-3.13-1.6 GM/177ML SOLN Take 1 kit by mouth as directed. 12/26/16  Yes Anapaula Severt L, MD  pantoprazole (PROTONIX) 40 MG tablet TAKE 1 TABLET BY MOUTH DAILY Patient taking differently: Take 40 mg by mouth daily 11/10/15  Yes Branch, Alphonse Guild, MD  senna (SENOKOT) 8.6 MG tablet Take 2 tablets by mouth at bedtime.    Yes [provider]  VITAMIN E PO Take 1 capsule by mouth daily.   Yes [provider]  lisinopril (PRINIVIL,ZESTRIL) 2.5 MG tablet TAKE 1 TABLET BY MOUTH DAILY Patient not taking: Reported on 01/18/2017 11/17/16   Arnoldo Lenis, MD  nitroGLYCERIN (NITROSTAT) 0.4 MG SL tablet Place 1 tablet (0.4 mg total) under the tongue every 5 (five) minutes as needed for chest pain. 04/07/14   Arnoldo Lenis, MD    Allergies as of 12/26/2016  . (No Known Allergies)    Family History  Problem Relation Age of Onset  . Heart disease Mother   .  Colon cancer Neg Hx   . Gastric cancer Neg Hx   . Esophageal cancer Neg Hx     Social History   Socioeconomic History  . Marital status: Married    Spouse name: Not on file  . Number of children: Not on file  . Years of education: Not on file  . Highest education level: Not on file  Social Needs  . Financial resource strain: Not on file  . Food insecurity - worry: Not on file  . Food insecurity - inability: Not on file  . Transportation needs - medical: Not on file  . Transportation needs - non-medical: Not on file  Occupational History  . Not on file  Tobacco Use  . Smoking status: Former Smoker    Packs/day: 1.50     Years: 22.00    Pack years: 33.00    Types: Cigarettes    Start date: 11/28/1942    Last attempt to quit: 02/07/1964    Years since quitting: 52.9  . Smokeless tobacco: Never Used  Substance and Sexual Activity  . Alcohol use: No    Alcohol/week: 0.0 oz    Frequency: Never  . Drug use: No  . Sexual activity: Yes  Other Topics Concern  . Not on file  Social History Narrative  . Not on file    Review of Systems: See HPI, otherwise negative ROS   Physical Exam: BP (!) 149/86   Pulse 98   Temp 97.7 F (36.5 C) (Oral)   Resp 18   Ht _0  (1.753 m)   Wt 160 lb (72.6 kg)   SpO2 96%   BMI 23.63 kg/m  General:   Alert,  pleasant and cooperative in NAD Head:  Normocephalic and atraumatic. Neck:  Supple; Lungs:  Clear throughout to auscultation.    Heart:  Regular rate and rhythm. Abdomen:  Soft, nontender and nondistended. Normal bowel sounds, without guarding, and without rebound.   Neurologic:  Alert and  oriented x4;  grossly normal neurologically.  Impression/Plan:     SCREENING  Plan:  1. TCS TODAY DISCUSSED PROCEDURE, BENEFITS, & RISKS: < 1% chance of medication reaction, bleeding, perforation, or rupture of spleen/liver.

## 2017-01-22 NOTE — Telephone Encounter (Signed)
Christine from Endo left Vm that Dr. Oneida Alar said this pt needs a 6 month follow up. Forwarding to [place] to nic.

## 2017-01-22 NOTE — Op Note (Signed)
[facility] Patient Name: Eric Benitez Procedure Date: 01/22/2017 9:57 AM MRN: [id number] Date of Birth: 03/13/1940 Attending MD: Barney Drain MD, MD CSN: [id number] Age: 75 Admit Type: Outpatient Procedure:                Colonoscopy WITH SNARE POLYPECTOMY Indications:              Heme positive stool Providers:                Barney Drain MD, MD, Janeece Riggers, RN, Lurline Del, RN Referring MD:             Mitzie Na. Daniel MD, MD Medicines:                Meperidine 75 mg IV, Midazolam 5 mg IV Complications:            No immediate complications. Estimated Blood Loss:     Estimated blood loss was minimal. Procedure:                Pre-Anesthesia Assessment:                           - Prior to the procedure, a History and Physical                            was performed, and patient medications and                            allergies were reviewed. The patient's tolerance of                            previous anesthesia was also reviewed. The risks                            and benefits of the procedure and the sedation                            options and risks were discussed with the patient.                            All questions were answered, and informed consent                            was obtained. Prior Anticoagulants: The patient                            last took aspirin 1 day prior to the procedure. ASA                            Grade Assessment: II - A patient with mild systemic                            disease. After reviewing the risks and benefits,                            the patient was deemed in satisfactory condition to  undergo the procedure.                           After obtaining informed consent, the colonoscope                            was passed under direct vision. Throughout the                            procedure, the patient's blood pressure, pulse, and                            oxygen saturations  were monitored continuously. The                            EC-[id number] ([id number]) scope was introduced through                            the anus and advanced to the 5 cm into the ileum.                            The colonoscopy was technically difficult and                            complex due to significant looping. Successful                            completion of the procedure was aided by increasing                            the dose of sedation medication and COLOWRAP. The                            patient tolerated the procedure fairly well. The                            quality of the bowel preparation was good. The                            terminal ileum, ileocecal valve, appendiceal                            orifice, and rectum were photographed. Scope In: 10:19:00 AM Scope Out: 10:47:04 AM Scope Withdrawal Time: 0 hours 20 minutes 59 seconds  Total Procedure Duration: 0 hours 28 minutes 4 seconds  Findings:      The terminal ileum appeared normal.      Six sessile polyps were found in the splenic flexure, transverse colon       and ascending colon. The polyps were 3 to 6 mm in size. These polyps       were removed with a cold snare. Resection and retrieval were complete.      The recto-sigmoid colon, sigmoid colon and descending colon were       moderately redundant.      External hemorrhoids were found  during retroflexion. The hemorrhoids       were moderate. Impression:               - The examined portion of the ileum was normal.                           - Six 3 to 6 mm polyps at the splenic flexure, in                            the transverse colon and in the ascending colon,                            removed with a cold snare. Resected and retrieved.                           - Redundant colon.                           - External hemorrhoids. Moderate Sedation:      Moderate (conscious) sedation was administered by the endoscopy nurse       and supervised  by the endoscopist. The following parameters were       monitored: oxygen saturation, heart rate, blood pressure, and response       to care. Total physician intraservice time was 48 minutes. Recommendation:           - Repeat colonoscopy in 3 years for surveillance.                           - High fiber diet and low fat diet.                           - Continue present medications.                           - Await pathology results.                           - Patient has a contact number available for                            emergencies. The signs and symptoms of potential                            delayed complications were discussed with the                            patient. Return to normal activities tomorrow.                            Written discharge instructions were provided to the                            patient. Procedure Code(s):        --- Professional ---  [phone], Colonoscopy, flexible; with removal of                            tumor(s), polyp(s), or other lesion(s) by snare                            technique                           99152, Moderate sedation services provided by the                            same physician or other qualified health care                            professional performing the diagnostic or                            therapeutic service that the sedation supports,                            requiring the presence of an independent trained                            observer to assist in the monitoring of the                            patient's level of consciousness and physiological                            status; initial 15 minutes of intraservice time,                            patient age 59 years or older                           [phone], Moderate sedation services; each additional                            15 minutes intraservice time                           99153, Moderate sedation  services; each additional                            15 minutes intraservice time Diagnosis Code(s):        --- Professional ---                           K64.4, Residual hemorrhoidal skin tags                           D12.3, Benign neoplasm of transverse colon (hepatic                            flexure  or splenic flexure)                           D12.2, Benign neoplasm of ascending colon                           R19.5, Other fecal abnormalities                           Q43.8, Other specified congenital malformations of                            intestine CPT copyright 2016 [employer]. All rights reserved. The codes documented in this report are preliminary and upon coder review may  be revised to meet current compliance requirements. Barney Drain, MD Barney Drain MD, MD 01/22/2017 11:13:56 AM This report has been signed electronically. Number of Addenda: 0

## 2017-01-22 NOTE — Discharge Instructions (Signed)
The blood detected in your stool was due TO GASTRITIS, POLYPS, AND INTERNAL HEMORRHOIDS. You had 6 polyps removed. You have small internal hemorrhoids. You have MODERATE gastritis AND A SMALL HIATAL HERNIA.  I biopsied your stomach.    DRINK WATER TO KEEP YOUR URINE LIGHT YELLOW.  FOLLOW A HIGH FIBER/LOW FAT DIET. AVOID ITEMS THAT CAUSE BLOATING. SEE INFO BELOW.  AVOID REFLUX TRIGGERS. SEE INFO BELOW.   VITMAIN E AND ASPIRIN MAKE YOU BLEED MORE EASILY.  HOLD THEM. RE-START DEC 22.  CONTINUE SENNA TO PREVENT CONSTIPATION.  CONTINUE PROTONIX. TAKE 30 MINUTES PRIOR TO BREAKFAST FOREVER.  YOUR BIOPSY RESULTS WILL BE AVAILABLE IN MY CHART AFTER DEC 21 AND MY OFFICE WILL CONTACT YOU IN 10-14 DAYS WITH YOUR RESULTS.    FOLLOW UP IN 6 MOS.   Next colonoscopy in 3 years.     ENDOSCOPY Care After Read the instructions outlined below and refer to this sheet in the next week. These discharge instructions provide you with general information on caring for yourself after you leave the hospital. While your treatment has been planned according to the most current medical practices available, unavoidable complications occasionally occur. If you have any problems or questions after discharge, call DR. Sheela Mcculley, [phone].  ACTIVITY  You may resume your regular activity, but move at a slower pace for the next 24 hours.   Take frequent rest periods for the next 24 hours.   Walking will help get rid of the air and reduce the bloated feeling in your belly (abdomen).   No driving for 24 hours (because of the medicine (anesthesia) used during the test).   You may shower.   Do not sign any important legal documents or operate any machinery for 24 hours (because of the anesthesia used during the test).    NUTRITION  Drink plenty of fluids.   You may resume your normal diet as instructed by your doctor.   Begin with a light meal and progress to your normal diet. Heavy or fried foods are  harder to digest and may make you feel sick to your stomach (nauseated).   Avoid alcoholic beverages for 24 hours or as instructed.    MEDICATIONS  You may resume your normal medications.   WHAT YOU CAN EXPECT TODAY  Some feelings of bloating in the abdomen.   Passage of more gas than usual.   Spotting of blood in your stool or on the toilet paper  .  IF YOU HAD POLYPS REMOVED DURING THE ENDOSCOPY:  Eat a soft diet IF YOU HAVE NAUSEA, BLOATING, ABDOMINAL PAIN, OR VOMITING.    FINDING OUT THE RESULTS OF YOUR TEST Not all test results are available during your visit. DR. Oneida Alar WILL CALL YOU WITHIN 14 DAYS OF YOUR PROCEDUE WITH YOUR RESULTS. Do not assume everything is normal if you have not heard from DR. Diamante Rubin, CALL HER OFFICE AT [phone].  SEEK IMMEDIATE MEDICAL ATTENTION AND CALL THE OFFICE: [phone] IF:  You have more than a spotting of blood in your stool.   Your belly is swollen (abdominal distention).   You are nauseated or vomiting.   You have a temperature over 101F.   You have abdominal pain or discomfort that is severe or gets worse throughout the day.   Gastritis  Gastritis/DUODENITIS is inflammation (the body's way of reacting to injury and/or infection) of the stomach. It is often caused by viral or bacterial (germ) infections. It can also be caused BY ASPIRIN, BC/GOODY POWDER'S, (IBUPROFEN) MOTRIN,  OR ALEVE (NAPROXEN), chemicals (including alcohol), SPICY FOODS, and medications. This illness may be associated with generalized malaise (feeling tired, not well), UPPER ABDOMINAL STOMACH cramps, and fever. One common bacterial cause of gastritis is an organism known as H. Pylori. This can be treated with antibiotics.    High-Fiber Diet A high-fiber diet changes your normal diet to include more whole grains, legumes, fruits, and vegetables. Changes in the diet involve replacing refined carbohydrates with unrefined foods. The calorie level of the diet is  essentially unchanged. The Dietary Reference Intake (recommended amount) for adult males is 38 grams per day. For adult females, it is 25 grams per day. Pregnant and lactating women should consume 28 grams of fiber per day. Fiber is the intact part of a plant that is not broken down during digestion. Functional fiber is fiber that has been isolated from the plant to provide a beneficial effect in the body. PURPOSE  Increase stool bulk.   Ease and regulate bowel movements.   Lower cholesterol.   REDUCE RISK OF COLON CANCER  INDICATIONS THAT YOU NEED MORE FIBER  Constipation and hemorrhoids.   Uncomplicated diverticulosis (intestine condition) and irritable bowel syndrome.   Weight management.   As a protective measure against hardening of the arteries (atherosclerosis), diabetes, and cancer.   GUIDELINES FOR INCREASING FIBER IN THE DIET  Start adding fiber to the diet slowly. A gradual increase of about 5 more grams (2 slices of whole-wheat bread, 2 servings of most fruits or vegetables, or 1 bowl of high-fiber cereal) per day is best. Too rapid an increase in fiber may result in constipation, flatulence, and bloating.   Drink enough water and fluids to keep your urine clear or pale yellow. Water, juice, or caffeine-free drinks are recommended. Not drinking enough fluid may cause constipation.   Eat a variety of high-fiber foods rather than one type of fiber.   Try to increase your intake of fiber through using high-fiber foods rather than fiber pills or supplements that contain small amounts of fiber.   The goal is to change the types of food eaten. Do not supplement your present diet with high-fiber foods, but replace foods in your present diet.   INCLUDE A VARIETY OF FIBER SOURCES  Replace refined and processed grains with whole grains, canned fruits with fresh fruits, and incorporate other fiber sources. White rice, white breads, and most bakery goods contain little or no  fiber.   Brown whole-grain rice, buckwheat oats, and many fruits and vegetables are all good sources of fiber. These include: broccoli, Brussels sprouts, cabbage, cauliflower, beets, sweet potatoes, white potatoes (skin on), carrots, tomatoes, eggplant, squash, berries, fresh fruits, and dried fruits.   Cereals appear to be the richest source of fiber. Cereal fiber is found in whole grains and bran. Bran is the fiber-rich outer coat of cereal grain, which is largely removed in refining. In whole-grain cereals, the bran remains. In breakfast cereals, the largest amount of fiber is found in those with "bran" in their names. The fiber content is sometimes indicated on the label.   You may need to include additional fruits and vegetables each day.   In baking, for 1 cup white flour, you may use the following substitutions:   1 cup whole-wheat flour minus 2 tablespoons.   1/2 cup white flour plus 1/2 cup whole-wheat flour.   Low-Fat Diet BREADS, CEREALS, PASTA, RICE, DRIED PEAS, AND BEANS These products are high in carbohydrates and most are low in fat. Therefore,  they can be increased in the diet as substitutes for fatty foods. They too, however, contain calories and should not be eaten in excess. Cereals can be eaten for snacks as well as for breakfast.  Include foods that contain fiber (fruits, vegetables, whole grains, and legumes). Research shows that fiber may lower blood cholesterol levels, especially the water-soluble fiber found in fruits, vegetables, oat products, and legumes. FRUITS AND VEGETABLES It is good to eat fruits and vegetables. Besides being sources of fiber, both are rich in vitamins and some minerals. They help you get the daily allowances of these nutrients. Fruits and vegetables can be used for snacks and desserts. MEATS Limit lean meat, chicken, [place], and fish to no more than 6 ounces per day. Beef, Pork, and Lamb Use lean cuts of beef, pork, and lamb. Lean cuts include:   Extra-lean ground beef.  Arm roast.  Sirloin tip.  Center-cut ham.  Round steak.  Loin chops.  Rump roast.  Tenderloin.  Trim all fat off the outside of meats before cooking. It is not necessary to severely decrease the intake of red meat, but lean choices should be made. Lean meat is rich in protein and contains a highly absorbable form of iron. Premenopausal women, in particular, should avoid reducing lean red meat because this could increase the risk for low red blood cells (iron-deficiency anemia). The organ meats, such as liver, sweetbreads, kidneys, and brain are very rich in cholesterol. They should be limited. Chicken and [place] These are good sources of protein. The fat of poultry can be reduced by removing the skin and underlying fat layers before cooking. Chicken and [place] can be substituted for lean red meat in the diet. Poultry should not be fried or covered with high-fat sauces. Fish and Shellfish Fish is a good source of protein. Shellfish contain cholesterol, but they usually are low in saturated fatty acids. The preparation of fish is important. Like chicken and [place], they should not be fried or covered with high-fat sauces. EGGS Egg whites contain no fat or cholesterol. They can be eaten often. Try 1 to 2 egg whites instead of whole eggs in recipes or use egg substitutes that do not contain yolk. MILK AND DAIRY PRODUCTS Use skim or 1% milk instead of 2% or whole milk. Decrease whole milk, natural, and processed cheeses. Use nonfat or low-fat (2%) cottage cheese or low-fat cheeses made from vegetable oils. Choose nonfat or low-fat (1 to 2%) yogurt. Experiment with evaporated skim milk in recipes that call for heavy cream. Substitute low-fat yogurt or low-fat cottage cheese for sour cream in dips and salad dressings. Have at least 2 servings of low-fat dairy products, such as 2 glasses of skim (or 1%) milk each day to help get your daily calcium intake.  FATS AND OILS Reduce  the total intake of fats, especially saturated fat. Butterfat, lard, and beef fats are high in saturated fat and cholesterol. These should be avoided as much as possible. Vegetable fats do not contain cholesterol, but certain vegetable fats, such as coconut oil, palm oil, and palm kernel oil are very high in saturated fats. These should be limited. These fats are often used in bakery goods, processed foods, popcorn, oils, and nondairy creamers. Vegetable shortenings and some peanut butters contain hydrogenated oils, which are also saturated fats. Read the labels on these foods and check for saturated vegetable oils. Unsaturated vegetable oils and fats do not raise blood cholesterol. However, they should be limited because they are fats  and are high in calories. Total fat should still be limited to 30% of your daily caloric intake. Desirable liquid vegetable oils are corn oil, cottonseed oil, olive oil, canola oil, safflower oil, soybean oil, and sunflower oil. Peanut oil is not as good, but small amounts are acceptable. Buy a heart-healthy tub margarine that has no partially hydrogenated oils in the ingredients. Mayonnaise and salad dressings often are made from unsaturated fats, but they should also be limited because of their high calorie and fat content. Seeds, nuts, peanut butter, olives, and avocados are high in fat, but the fat is mainly the unsaturated type. These foods should be limited mainly to avoid excess calories and fat. OTHER EATING TIPS Snacks  Most sweets should be limited as snacks. They tend to be rich in calories and fats, and their caloric content outweighs their nutritional value. Some good choices in snacks are graham crackers, melba toast, soda crackers, bagels (no egg), English muffins, fruits, and vegetables. These snacks are preferable to snack crackers, [place] fries, and chips. Popcorn should be air-popped or cooked in small amounts of liquid vegetable oil. Desserts Eat fruit,  low-fat yogurt, and fruit ices. AVOID pastries, cake, and cookies. Sherbet, angel food cake, gelatin dessert, frozen low-fat yogurt, or other frozen products that do not contain saturated fat (pure fruit juice bars, frozen ice pops) are also acceptable.  COOKING METHODS Choose those methods that use little or no fat. They include: Poaching.  Braising.  Steaming.  Grilling.  Baking.  Stir-frying.  Broiling.  Microwaving.  Foods can be cooked in a nonstick pan without added fat, or use a nonfat cooking spray in regular cookware. Limit fried foods and avoid frying in saturated fat. Add moisture to lean meats by using water, broth, cooking wines, and other nonfat or low-fat sauces along with the cooking methods mentioned above. Soups and stews should be chilled after cooking. The fat that forms on top after a few hours in the refrigerator should be skimmed off. When preparing meals, avoid using excess salt. Salt can contribute to raising blood pressure in some people. EATING AWAY FROM HOME Order entres, potatoes, and vegetables without sauces or butter. When meat exceeds the size of a deck of cards (3 to 4 ounces), the rest can be taken home for another meal. Choose vegetable or fruit salads and ask for low-calorie salad dressings to be served on the side. Use dressings sparingly. Limit high-fat toppings, such as bacon, crumbled eggs, cheese, sunflower seeds, and olives. Ask for heart-healthy tub margarine instead of butter.    Lifestyle and home remedies TO CONTROL HEARTBURN/REFLUX You may eliminate or reduce the frequency of heartburn by making the following lifestyle changes: Control your weight. Being overweight is a major risk factor for heartburn and GERD. Excess pounds put pressure on your abdomen, pushing up your stomach and causing acid to back up into your esophagus.  Eat smaller meals. 4 TO 6 MEALS A DAY. This reduces pressure on the lower esophageal sphincter, helping to prevent the  valve from opening and acid from washing back into your esophagus.  Loosen your belt. Clothes that fit tightly around your waist put pressure on your abdomen and the lower esophageal sphincter.   Eliminate heartburn triggers. Everyone has specific triggers. Common triggers such as fatty or fried foods, spicy food, tomato sauce, carbonated beverages, alcohol, chocolate, mint, garlic, onion, caffeine and nicotine may make heartburn worse.   Avoid stooping or bending. Tying your shoes is OK. Bending over for longer  periods to weed your garden isn't, especially soon after eating.   Don't lie down after a meal. Wait at least three to four hours after eating before going to bed, and don't lie down right after eating.   Raise the head of your bed. An elevation of about six to nine inches puts gravity to work for you. You can do this by placing wooden or cement blocks under the feet of your bed at the head end. If it's not possible to elevate your bed, you can insert a wedge between your mattress and box spring to elevate your body from the waist up. Wedges are available at drugstores and medical supply stores. Raising your head only by using pillows is not a good alternative.   Alternative medicine Several home remedies exist for treating GERD, but they provide only temporary relief. They include drinking baking soda (sodium bicarbonate) added to water or drinking other fluids such as baking soda mixed with cream of tartar and water. Although these liquids create temporary relief by neutralizing, washing away or buffering acids, eventually they aggravate the situation by adding gas and fluid to your stomach, increasing pressure and causing more acid reflux. Further, adding more sodium to your diet may increase your blood pressure and add stress to your heart, and excessive bicarbonate ingestion can alter the acid-base balance in your body.    Polyps, Colon  A polyp is extra tissue that grows inside your  body. Colon polyps grow in the large intestine. The large intestine, also called the colon, is part of your digestive system. It is a long, hollow tube at the end of your digestive tract where your body makes and stores stool. Most polyps are not dangerous. They are benign. This means they are not cancerous. But over time, some types of polyps can turn into cancer. Polyps that are smaller than a pea are usually not harmful. But larger polyps could someday become or may already be cancerous. To be safe, doctors remove all polyps and test them.   WHO GETS POLYPS? Anyone can get polyps, but certain people are more likely than others. You may have a greater chance of getting polyps if:  You are over 50.   You have had polyps before.   Someone in your family has had polyps.   Someone in your family has had cancer of the large intestine.   Find out if someone in your family has had polyps. You may also be more likely to get polyps if you:              PREVENTION There is not one sure way to prevent polyps. You might be able to lower your risk of getting them if you:  Eat more fruits and vegetables and less fatty food.   Do not smoke.   Avoid alcohol.   Exercise every day.   Lose weight if you are overweight.   Eating more calcium and folate can also lower your risk of getting polyps. Some foods that are rich in calcium are milk, cheese, and broccoli. Some foods that are rich in folate are chickpeas, kidney beans, and spinach.

## 2017-01-22 NOTE — Op Note (Signed)
[facility] Patient Name: Eric Benitez Procedure Date: 01/22/2017 10:50 AM MRN: [id number] Date of Birth: 1940-08-18 Attending MD: Barney Drain MD, MD CSN: [id number] Age: 76 Admit Type: Outpatient Procedure:                Upper GI endoscopy WITH COLD FORCEPS BIOPSY Indications:              Heme positive stool Providers:                Barney Drain MD, MD, Janeece Riggers, RN, Lurline Del, RN Referring MD:             Mitzie Na. Daniel MD, MD Medicines:                None Complications:            No immediate complications. Estimated Blood Loss:     Estimated blood loss was minimal. Procedure:                Pre-Anesthesia Assessment:                           - Prior to the procedure, a History and Physical                            was performed, and patient medications and                            allergies were reviewed. The patient's tolerance of                            previous anesthesia was also reviewed. The risks                            and benefits of the procedure and the sedation                            options and risks were discussed with the patient.                            All questions were answered, and informed consent                            was obtained. Prior Anticoagulants: The patient                            last took aspirin 1 day prior to the procedure. ASA                            Grade Assessment: II - A patient with mild systemic                            disease. After reviewing the risks and benefits,                            the patient was deemed in satisfactory condition to  undergo the procedure. After obtaining informed                            consent, the endoscope was passed under direct                            vision. Throughout the procedure, the patient's                            blood pressure, pulse, and oxygen saturations were                            monitored continuously.  The [id number]) was                            introduced through the mouth, and advanced to the                            second part of duodenum. The upper GI endoscopy was                            accomplished without difficulty. The patient                            tolerated the procedure well. Scope In: Scope Out: 10:57:39 AM Findings:      The examined esophagus was normal.      Diffuse moderate inflammation characterized by congestion (edema) and       erythema was found in the entire examined stomach. Biopsies were taken       with a cold forceps for Helicobacter pylori testing.      The examined duodenum was normal. Impression:               - MILD TO MODERTAE Gastritis Moderate Sedation:      Moderate (conscious) sedation was administered by the endoscopy nurse       and supervised by the endoscopist. The following parameters were       monitored: oxygen saturation, heart rate, blood pressure, and response       to care. Total physician intraservice time was 48 minutes. Recommendation:           - Await pathology results.                           - Continue present medications.                           - High fiber diet and low fat diet.                           - Continue present medications.                           - Patient has a contact number available for                            emergencies. The signs and  symptoms of potential                            delayed complications were discussed with the                            patient. Return to normal activities tomorrow.                            Written discharge instructions were provided to the                            patient. Procedure Code(s):        --- Professional ---                           [id number], Esophagogastroduodenoscopy, flexible,                            transoral; with biopsy, single or multiple                           99152, Moderate sedation services provided by the                             same physician or other qualified health care                            professional performing the diagnostic or                            therapeutic service that the sedation supports,                            requiring the presence of an independent trained                            observer to assist in the monitoring of the                            patient's level of consciousness and physiological                            status; initial 15 minutes of intraservice time,                            patient age 66 years or older                           [phone], Moderate sedation services; each additional                            15 minutes intraservice time                           99153, Moderate sedation services;  each additional                            15 minutes intraservice time Diagnosis Code(s):        --- Professional ---                           K29.70, Gastritis, unspecified, without bleeding                           R19.5, Other fecal abnormalities CPT copyright 2016 American Medical Association. All rights reserved. The codes documented in this report are preliminary and upon coder review may  be revised to meet current compliance requirements. Barney Drain, MD Barney Drain MD, MD 01/22/2017 11:20:28 AM This report has been signed electronically. Number of Addenda: 0

## 2017-01-25 NOTE — Progress Notes (Signed)
PT is ware.

## 2017-01-25 NOTE — Progress Notes (Signed)
PATIENT SCHEDULED AND ON RECALL  °

## 2017-01-26 ENCOUNTER — Encounter (HOSPITAL_COMMUNITY): Payer: Self-pay | Admitting: Gastroenterology

## 2017-04-04 DIAGNOSIS — R0981 Nasal congestion: Secondary | ICD-10-CM | POA: Diagnosis not present

## 2017-04-04 DIAGNOSIS — Z6825 Body mass index (BMI) 25.0-25.9, adult: Secondary | ICD-10-CM | POA: Diagnosis not present

## 2017-04-04 DIAGNOSIS — J301 Allergic rhinitis due to pollen: Secondary | ICD-10-CM | POA: Diagnosis not present

## 2017-04-24 DIAGNOSIS — H16223 Keratoconjunctivitis sicca, not specified as Sjogren's, bilateral: Secondary | ICD-10-CM | POA: Diagnosis not present

## 2017-04-24 DIAGNOSIS — Z961 Presence of intraocular lens: Secondary | ICD-10-CM | POA: Diagnosis not present

## 2017-04-24 DIAGNOSIS — H52223 Regular astigmatism, bilateral: Secondary | ICD-10-CM | POA: Diagnosis not present

## 2017-04-24 DIAGNOSIS — H5203 Hypermetropia, bilateral: Secondary | ICD-10-CM | POA: Diagnosis not present

## 2017-05-07 ENCOUNTER — Telehealth: Payer: Self-pay

## 2017-05-07 ENCOUNTER — Other Ambulatory Visit: Payer: Self-pay

## 2017-05-07 ENCOUNTER — Emergency Department (HOSPITAL_COMMUNITY): Payer: PPO

## 2017-05-07 ENCOUNTER — Emergency Department (HOSPITAL_COMMUNITY)
Admission: EM | Admit: 2017-05-07 | Discharge: 2017-05-07 | Disposition: A | Discharge status: Home / Self Care | Payer: PPO | Attending: Emergency Medicine | Admitting: Emergency Medicine

## 2017-05-07 ENCOUNTER — Encounter (HOSPITAL_COMMUNITY): Payer: Self-pay | Admitting: Emergency Medicine

## 2017-05-07 DIAGNOSIS — Z79899 Other long term (current) drug therapy: Secondary | ICD-10-CM | POA: Insufficient documentation

## 2017-05-07 DIAGNOSIS — M542 Cervicalgia: Secondary | ICD-10-CM | POA: Diagnosis not present

## 2017-05-07 DIAGNOSIS — I2510 Atherosclerotic heart disease of native coronary artery without angina pectoris: Secondary | ICD-10-CM | POA: Diagnosis not present

## 2017-05-07 DIAGNOSIS — Z87891 Personal history of nicotine dependence: Secondary | ICD-10-CM | POA: Diagnosis not present

## 2017-05-07 DIAGNOSIS — R0602 Shortness of breath: Secondary | ICD-10-CM | POA: Diagnosis not present

## 2017-05-07 DIAGNOSIS — Z955 Presence of coronary angioplasty implant and graft: Secondary | ICD-10-CM | POA: Insufficient documentation

## 2017-05-07 DIAGNOSIS — I10 Essential (primary) hypertension: Secondary | ICD-10-CM | POA: Diagnosis not present

## 2017-05-07 DIAGNOSIS — I499 Cardiac arrhythmia, unspecified: Secondary | ICD-10-CM | POA: Diagnosis not present

## 2017-05-07 DIAGNOSIS — R0600 Dyspnea, unspecified: Secondary | ICD-10-CM | POA: Diagnosis present

## 2017-05-07 LAB — BASIC METABOLIC PANEL
Anion gap: 10 (ref 5–15)
BUN: 18 mg/dL (ref 6–20)
CO2: 23 mmol/L (ref 22–32)
Calcium: 9.1 mg/dL (ref 8.9–10.3)
Chloride: 105 mmol/L (ref 101–111)
Creatinine, Ser: 1 mg/dL (ref 0.61–1.24)
GFR calc Af Amer: >60 mL/min (ref >60)
GFR calc non Af Amer: >60 mL/min (ref >60)
Glucose, Bld: 109 mg/dL — ABNORMAL HIGH (ref 65–99)
Potassium: 4.1 mmol/L (ref 3.5–5.1)
Sodium: 138 mmol/L (ref 135–145)

## 2017-05-07 LAB — CBC
HCT: 39.6 % (ref 39.0–52.0)
Hemoglobin: 13 g/dL (ref 13.0–17.0)
MCH: 30.8 pg (ref 26.0–34.0)
MCHC: 32.8 g/dL (ref 30.0–36.0)
MCV: 93.8 fL (ref 78.0–100.0)
Platelets: 183 10*3/uL (ref 150–400)
RBC: 4.22 MIL/uL (ref 4.22–5.81)
RDW: 13.9 % (ref 11.5–15.5)
WBC: 8 10*3/uL (ref 4.0–10.5)

## 2017-05-07 LAB — BRAIN NATRIURETIC PEPTIDE: B Natriuretic Peptide: 104 pg/mL — ABNORMAL HIGH (ref 0.0–100.0)

## 2017-05-07 LAB — TROPONIN I: Troponin I: <0.03 ng/mL (ref <0.03)

## 2017-05-07 NOTE — Telephone Encounter (Signed)
Patient contacted office stating for the past couple of weeks he has been very lightheaded, dizzy and felt like he was going to pass out. Patient states more recently he has felt like he was going to pass out about 2-3 times a day (last about 10-12 seconds). Patient states he has been very short of breath and gets winded very easily. Does not increase when he lies down. Patient states he has noticed weight loss. Patient denies any chest pain, but states he has been having severe (8/10) pain in neck. Patient states when he last followed up with PCP he was not having these symptoms. First available appt scheduled for April 10th with Dr. Harl Bowie. Advised patient he needed to be seen in the ER to further evaluate what is going on. Patient verbalized understanding and states he will have someone take him to [facility].

## 2017-05-07 NOTE — ED Notes (Signed)
ED Provider at bedside. 

## 2017-05-07 NOTE — Discharge Instructions (Addendum)
Follow-up in the office with Dr Harl Bowie this week. Restart ranexa as previously prescribed.

## 2017-05-07 NOTE — ED Notes (Signed)
Family at bedside. 

## 2017-05-07 NOTE — ED Provider Notes (Signed)
[facility] EMERGENCY DEPARTMENT Provider Note   CSN: [id number] Arrival date & time: 05/07/17  3532     History   Chief Complaint Chief Complaint  Patient presents with  . Shortness of Breath    HPI Eric Benitez is a 77 y.o. male.  HPI   76yM with dyspnea. Worsening over past several weeks. NOw to point that cannot walk to mailbox w/o feeling SOB. Denies CP. Is having neck pain, but reports this is chronic and does not seem worsen when he is also having the dyspnea. No fever or chills. No cough. No orthopnea. No unusual leg pain or swelling   Hx of CAD s/p stent 10+y ago.  [place] 04/2014 with no significant CAD.   Past Medical History:  Diagnosis Date  . Arthritis    "neck, hands, fingers" (04/09/2014)  . Bleeds easily ([place])   . CAD (coronary artery disease)   . Hyperlipemia   . Hypertension   . Kidney stones    "I've had several; had to go in and get them once" (04/09/2014)  . Pneumonia 1940's    Patient Active Problem List   Diagnosis Date Noted  . GERD (gastroesophageal reflux disease) 12/26/2016  . Constipation 12/26/2016  . Heme + stool 12/26/2016  . Unstable angina ([place]) 04/09/2014  . Essential hypertension 04/09/2014  . Hyperlipidemia with target LDL less than 70 04/09/2014  . Chest pain with low risk of acute coronary syndrome 04/09/2014  . Pain in the chest   . CAD S/P percutaneous coronary angioplasty - PCI to LAD in 2006 04/27/2004    Past Surgical History:  Procedure Laterality Date  . ANKLE FRACTURE SURGERY Right 2009  . CARDIAC CATHETERIZATION  1990's X 2;  04/09/2014  . COLONOSCOPY N/A 01/22/2017   Procedure: COLONOSCOPY;  Surgeon: Danie Binder, MD;  Location: [facility];  Service: Endoscopy;  Laterality: N/A;  12:30pm  . CORONARY ANGIOPLASTY WITH STENT PLACEMENT  2006   "2"  . CYSTOSCOPY W/ STONE MANIPULATION  ~ 2008  . ESOPHAGOGASTRODUODENOSCOPY N/A 01/22/2017   Procedure: ESOPHAGOGASTRODUODENOSCOPY (EGD);  Surgeon: Danie Binder, MD;   Location: [facility];  Service: Endoscopy;  Laterality: N/A;  . FRACTURE SURGERY    . LEFT HEART CATHETERIZATION WITH CORONARY ANGIOGRAM N/A 04/09/2014   Procedure: LEFT HEART CATHETERIZATION WITH CORONARY ANGIOGRAM;  Surgeon: Leonie Man, MD;  Location: [facility] CATH LAB;  Service: Cardiovascular;  Laterality: N/A;  . NASAL SEPTUM SURGERY  1999  . POLYPECTOMY  01/22/2017   Procedure: POLYPECTOMY;  Surgeon: Danie Binder, MD;  Location: [facility];  Service: Endoscopy;;  right colon x4        Home Medications    Prior to Admission medications   Medication Sig Start Date End Date Taking? Authorizing Provider  acetaminophen (TYLENOL) 500 MG tablet Take 1,000 mg by mouth every 6 (six) hours as needed for moderate pain or headache.    [provider]  albuterol (PROVENTIL HFA;VENTOLIN HFA) 108 (90 Base) MCG/ACT inhaler Inhale 1 puff into the lungs as needed for wheezing or shortness of breath. Patient taking differently: Inhale 1 puff into the lungs every 6 (six) hours as needed for wheezing or shortness of breath.  11/03/15   Arnoldo Lenis, MD  atorvastatin (LIPITOR) 80 MG tablet Take 80 mg by mouth at bedtime.     [provider]  Cholecalciferol (VITAMIN D3 PO) Take 1 capsule by mouth daily.    [provider]  diphenhydramine-acetaminophen (TYLENOL PM) 25-500  MG TABS tablet Take 2 tablets by mouth at bedtime.    [provider]  doxazosin (CARDURA) 8 MG tablet Take 8 mg by mouth at bedtime.     [provider]  metoprolol succinate (TOPROL-XL) 25 MG 24 hr tablet Take 25 mg by mouth daily.    [provider]  Multiple Vitamin (MULTIVITAMIN) tablet Take 1 tablet by mouth daily.    [provider]  nitroGLYCERIN (NITROSTAT) 0.4 MG SL tablet Place 1 tablet (0.4 mg total) under the tongue every 5 (five) minutes as needed for chest pain. 04/07/14   Arnoldo Lenis, MD  pantoprazole (PROTONIX) 40 MG tablet TAKE 1 TABLET BY  MOUTH DAILY Patient taking differently: Take 40 mg by mouth daily 11/10/15   Arnoldo Lenis, MD  senna (SENOKOT) 8.6 MG tablet Take 2 tablets by mouth at bedtime.     [provider]    Family History Family History  Problem Relation Age of Onset  . Heart disease Mother   . Colon cancer Neg Hx   . Gastric cancer Neg Hx   . Esophageal cancer Neg Hx     Social History Social History   Tobacco Use  . Smoking status: Former Smoker    Packs/day: 1.50    Years: 22.00    Pack years: 33.00    Types: Cigarettes    Start date: 11/28/1942    Last attempt to quit: 02/07/1964    Years since quitting: 53.2  . Smokeless tobacco: Never Used  Substance Use Topics  . Alcohol use: No    Alcohol/week: 0.0 oz    Frequency: Never  . Drug use: No     Allergies   Vancomycin   Review of Systems Review of Systems  All systems reviewed and negative, other than as noted in HPI.   Physical Exam Updated Vital Signs BP (!) 142/81   Pulse 65   Temp 98.1 F (36.7 C) (Oral)   Resp 14   Ht 5\' 9"  (1.753 m)   Wt 72.6 kg (160 lb)   SpO2 98%   BMI 23.63 kg/m   Physical Exam  Constitutional: He appears well-developed and well-nourished. No distress.  HENT:  Head: Normocephalic and atraumatic.  Eyes: Conjunctivae are normal. Right eye exhibits no discharge. Left eye exhibits no discharge.  Neck: Neck supple.  Cardiovascular: Normal rate, regular rhythm and normal heart sounds. Exam reveals no gallop and no friction rub.  No murmur heard. Pulmonary/Chest: Effort normal and breath sounds normal. No respiratory distress.  Abdominal: Soft. He exhibits no distension. There is no tenderness.  Musculoskeletal: He exhibits no edema or tenderness.  Reports pain from about mid cervical spine to ~T2 level but not reproducible on palpation or with passive ROM. Strength 5/5 b/l UE.   Neurological: He is alert.  Skin: Skin is warm and dry.  Psychiatric: He has a normal mood and affect. His  behavior is normal. Thought content normal.  Nursing note and vitals reviewed.    ED Treatments / Results  Labs (all labs ordered are listed, but only abnormal results are displayed) Labs Reviewed  BASIC METABOLIC PANEL - Abnormal; Notable for the following components:      Result Value   Glucose, Bld 109 (*)    All other components within normal limits  BRAIN NATRIURETIC PEPTIDE - Abnormal; Notable for the following components:   B Natriuretic Peptide 104.0 (*)    All other components within normal limits  CBC  TROPONIN I  EKG EKG Interpretation  Date/Time:  Monday May 07 2017 09:42:14 EDT Ventricular Rate:  81 PR Interval:    QRS Duration: 90 QT Interval:  377 QTC Calculation: 438 R Axis:   30 Text Interpretation:  Sinus arrhythmia Ventricular premature complex No significant change since last tracing Confirmed by Virgel Manifold [phone]) on 05/07/2017 10:47:38 AM   Radiology Dg Chest 2 View  Result Date: 05/07/2017 CLINICAL DATA:  Pain and shortness of breath, history coronary artery disease, hypertension EXAM: CHEST - 2 VIEW COMPARISON:  02/25/2015 FINDINGS: Normal heart size, mediastinal contours, and pulmonary vascularity. Mild chronic interstitial changes in the periphery of the mid to lower lungs greater on RIGHT, stable. No acute infiltrate, pleural effusion, or pneumothorax. Bones unremarkable. IMPRESSION: Peripheral chronic interstitial changes greater on RIGHT. No acute abnormalities. Electronically Signed   By: Lavonia Dana M.D.   On: 05/07/2017 11:03    Procedures Procedures (including critical care time)  Medications Ordered in ED Medications - No data to display   Initial Impression / Assessment and Plan / ED Course  I have reviewed the triage vital signs and the nursing notes.  Pertinent labs & imaging results that were available during my care of the patient were reviewed by me and considered in my medical decision making (see chart for details).       76yM with with exertional dyspnea and neck pain. ED work-up fairly unremarkable but progressive exertional dyspnea is concerning. Neck pain is chronic and I'm not convinced directly related. I think he needs some provocative testing. He has established cardiologist, Dr Harl Bowie. Discussed with Dr Domenic Polite today. Pt can be seen in office this week. Will have him restart on previously prescribe ranexa until then. He reports he still has some. Understands signs/symptoms for immediate return to ED in the interim.   Final Clinical Impressions(s) / ED Diagnoses   Final diagnoses:  Shortness of breath    ED Discharge Orders    None       Virgel Manifold, MD 05/08/17 1100

## 2017-05-07 NOTE — ED Triage Notes (Signed)
Pt reports increasing shortness of breath for the past few weeks.  Slight cough this morning, but not much.  Denies pain.  States he becomes short of breath just going to the mailbox and back.

## 2017-05-07 NOTE — Telephone Encounter (Signed)
With that many new onset symptoms to that degree I agree with ER evaluation   Zandra Abts MD

## 2017-05-08 ENCOUNTER — Telehealth: Payer: Self-pay | Admitting: Cardiology

## 2017-05-08 ENCOUNTER — Other Ambulatory Visit: Payer: Self-pay | Admitting: Cardiology

## 2017-05-08 MED ORDER — LISINOPRIL 2.5 MG PO TABS: 2.5000 mg | ORAL_TABLET | Freq: Every day | ORAL | 1 refills | Status: DC

## 2017-05-08 MED ORDER — NITROGLYCERIN 0.4 MG SL SUBL: 0.4000 mg | SUBLINGUAL_TABLET | SUBLINGUAL | 3 refills | Status: DC | PRN

## 2017-05-08 NOTE — Telephone Encounter (Signed)
Patient's wife called stating that patient was in the ER 05/07/2017. He has upcoming appointment 05/16/2017 . Patient was told he would need a stress test. He also has questions about his medication.   [phone].

## 2017-05-08 NOTE — Telephone Encounter (Signed)
Pt actually just requests refills on lisinopril and NTG and to confirm appt for 4/10 - Medication sent to pharmacy.

## 2017-05-14 ENCOUNTER — Telehealth: Payer: Self-pay

## 2017-05-14 DIAGNOSIS — R42 Dizziness and giddiness: Secondary | ICD-10-CM | POA: Diagnosis not present

## 2017-05-14 DIAGNOSIS — Z6824 Body mass index (BMI) 24.0-24.9, adult: Secondary | ICD-10-CM | POA: Diagnosis not present

## 2017-05-14 DIAGNOSIS — R0602 Shortness of breath: Secondary | ICD-10-CM | POA: Diagnosis not present

## 2017-05-14 NOTE — Telephone Encounter (Signed)
Daughter notified 

## 2017-05-14 NOTE — Telephone Encounter (Signed)
I can see tomorrow at 940AM. Please get clinic note from Dr Aundria Rud MD

## 2017-05-14 NOTE — Telephone Encounter (Signed)
Daughter contacted office stating patient has gotten worse since being seen in the ED on 4/1. Daughter states patient is still having multiple dizzy spells a day, increase in heart rate and decrease in BP. Daughter did not have exact numbers for HR and BP but stated she would call back. Daughter also stated patient is so short of breath he is having a hard time getting up to complete ADL's. Patient seen Dr. Olena Heckle today and was instructed not to do anything until he was evaluated by cardiologist. Patient has appt scheduled for 4/10 but daughter is very concerned something may happen before then. Offered patient an appt for tomorrow in [place] with Ahmed Prima but daughter stated they wanted to see Dr. Harl Bowie. Also instructed daughter that to further monitor what was going on she would need to take patient to the ER for further evaluation. Advised daughter I would send to provider for further recommendation.

## 2017-05-15 ENCOUNTER — Telehealth: Payer: Self-pay | Admitting: Cardiology

## 2017-05-15 ENCOUNTER — Encounter: Payer: Self-pay | Admitting: Cardiology

## 2017-05-15 ENCOUNTER — Ambulatory Visit: Payer: PPO | Admitting: Cardiology

## 2017-05-15 VITALS — BP 128/70 | HR 72 | Ht 69.0 in | Wt 164.4 lb

## 2017-05-15 DIAGNOSIS — R0602 Shortness of breath: Secondary | ICD-10-CM

## 2017-05-15 DIAGNOSIS — I2510 Atherosclerotic heart disease of native coronary artery without angina pectoris: Secondary | ICD-10-CM

## 2017-05-15 NOTE — Telephone Encounter (Signed)
Pre-cert Verification for the following procedure   Echo scheduled for 05-16-17 at [facility].

## 2017-05-15 NOTE — Progress Notes (Signed)
Clinical Summary Mr. Fedak is a 77 y.o.male seen today for follow up of the following medical problems. This is a focused visit on recent symptoms of SOB.   1. CAD - hx of prior stenting 10 years ago - cath 04/09/14 with patent coronaries.  - since starting ranexa previously his chest pain has resolved.  - no recent chest pain. But can have some SOB with activities, tends to be more with hot weather.  - compliant with meds  - no recent chest pain.+SOB and DOE.     2. SOB - 10/2015 PFTs mild ventilatory defect  -ER visit 05/07/17 with SOB x several weeks. Normal labs with Hgb 13, trop neg x 1. BNP 104.  CXR no acute process. EKG SR, no acute ischemic changes  - SOB increased over the last month.  - DOE with walking to [employer]. No chest pain. Has to stop and catch and breath - some cough, nonproductive. No wheezing. No recent edema. No orthopnea.  - weight down 168 to 164   3. Neck pain - chronic, unchanged    4. Dizzy spells - never occurs with lying or sitting.  - mainly with first standing. - 2 bottles of water daily, 2 cups of coffee, no tea, no sodas, no EtoH.        Past Medical History:  Diagnosis Date  . Arthritis    "neck, hands, fingers" (04/09/2014)  . Bleeds easily ([place])   . CAD (coronary artery disease)   . Hyperlipemia   . Hypertension   . Kidney stones    "I've had several; had to go in and get them once" (04/09/2014)  . Pneumonia 1940's     Allergies  Allergen Reactions  . Vancomycin Rash     Current Outpatient Medications  Medication Sig Dispense Refill  . acetaminophen (TYLENOL) 500 MG tablet Take 1,000 mg by mouth every 6 (six) hours as needed for moderate pain or headache.    . albuterol (PROVENTIL HFA;VENTOLIN HFA) 108 (90 Base) MCG/ACT inhaler Inhale 1 puff into the lungs as needed for wheezing or shortness of breath. (Patient taking differently: Inhale 1 puff into the lungs every 6 (six) hours as needed for wheezing or  shortness of breath. ) 1 Inhaler 2  . atorvastatin (LIPITOR) 80 MG tablet Take 80 mg by mouth at bedtime.     . Cholecalciferol (VITAMIN D3 PO) Take 1 capsule by mouth daily.    . diphenhydramine-acetaminophen (TYLENOL PM) 25-500 MG TABS tablet Take 2 tablets by mouth at bedtime.    Marland Kitchen doxazosin (CARDURA) 8 MG tablet Take 8 mg by mouth at bedtime.     Marland Kitchen lisinopril (PRINIVIL,ZESTRIL) 2.5 MG tablet Take 1 tablet (2.5 mg total) by mouth daily. 90 tablet 1  . metoprolol succinate (TOPROL-XL) 25 MG 24 hr tablet Take 25 mg by mouth daily.    . Multiple Vitamin (MULTIVITAMIN) tablet Take 1 tablet by mouth daily.    . nitroGLYCERIN (NITROSTAT) 0.4 MG SL tablet Place 1 tablet (0.4 mg total) under the tongue every 5 (five) minutes as needed for chest pain. 25 tablet 3  . pantoprazole (PROTONIX) 40 MG tablet TAKE 1 TABLET BY MOUTH DAILY (Patient taking differently: Take 40 mg by mouth daily) 30 tablet 6  . senna (SENOKOT) 8.6 MG tablet Take 2 tablets by mouth at bedtime.      No current facility-administered medications for this visit.      Past Surgical History:  Procedure Laterality Date  .  ANKLE FRACTURE SURGERY Right 2009  . CARDIAC CATHETERIZATION  1990's X 2;  04/09/2014  . COLONOSCOPY N/A 01/22/2017   Procedure: COLONOSCOPY;  Surgeon: Danie Binder, MD;  Location: [facility];  Service: Endoscopy;  Laterality: N/A;  12:30pm  . CORONARY ANGIOPLASTY WITH STENT PLACEMENT  2006   "2"  . CYSTOSCOPY W/ STONE MANIPULATION  ~ 2008  . ESOPHAGOGASTRODUODENOSCOPY N/A 01/22/2017   Procedure: ESOPHAGOGASTRODUODENOSCOPY (EGD);  Surgeon: Danie Binder, MD;  Location: [facility];  Service: Endoscopy;  Laterality: N/A;  . FRACTURE SURGERY    . LEFT HEART CATHETERIZATION WITH CORONARY ANGIOGRAM N/A 04/09/2014   Procedure: LEFT HEART CATHETERIZATION WITH CORONARY ANGIOGRAM;  Surgeon: Leonie Man, MD;  Location: [facility] CATH LAB;  Service: Cardiovascular;  Laterality: N/A;  . NASAL SEPTUM SURGERY  1999  .  POLYPECTOMY  01/22/2017   Procedure: POLYPECTOMY;  Surgeon: Danie Binder, MD;  Location: [facility];  Service: Endoscopy;;  right colon x4     Allergies  Allergen Reactions  . Vancomycin Rash      Family History  Problem Relation Age of Onset  . Heart disease Mother   . Colon cancer Neg Hx   . Gastric cancer Neg Hx   . Esophageal cancer Neg Hx      Social History Mr. Vanrossum reports that he quit smoking about 53 years ago. His smoking use included cigarettes. He started smoking about 74 years ago. He has a 33.00 pack-year smoking history. He has never used smokeless tobacco. Mr. Weidinger reports that he does not drink alcohol.   Review of Systems CONSTITUTIONAL: No weight loss, fever, chills, weakness or fatigue.  HEENT: Eyes: No visual loss, blurred vision, double vision or yellow sclerae.No hearing loss, sneezing, congestion, runny nose or sore throat.  SKIN: No rash or itching.  CARDIOVASCULAR: per hpi RESPIRATORY: per hpi GASTROINTESTINAL: No anorexia, nausea, vomiting or diarrhea. No abdominal pain or blood.  GENITOURINARY: No burning on urination, no polyuria NEUROLOGICAL:per hpi MUSCULOSKELETAL: No muscle, back pain, joint pain or stiffness.  LYMPHATICS: No enlarged nodes. No history of splenectomy.  PSYCHIATRIC: No history of depression or anxiety.  ENDOCRINOLOGIC: No reports of sweating, cold or heat intolerance. No polyuria or polydipsia.  Marland Kitchen   Physical Examination Vitals:   05/15/17 0932  BP: 128/70  Pulse: 72  SpO2: 96%   Vitals:   05/15/17 0932  Weight: 164 lb 6.4 oz (74.6 kg)  Height: 5\' 9"  (1.753 m)    Gen: resting comfortably, no acute distress HEENT: no scleral icterus, pupils equal round and reactive, no palptable cervical adenopathy,  CV: RRR, no m/r/g, no jvd Resp: Clear to auscultation bilaterally GI: abdomen is soft, non-tender, non-distended, normal bowel sounds, no hepatosplenomegaly MSK: extremities are warm, no edema.  Skin: warm,  no rash Neuro:  no focal deficits Psych: appropriate affect   Diagnostic Studies 04/09/14 Cath Hemodynamics:  Central Aortic Pressure / Mean: 126/67/92 mmHg  Left Ventricular Pressure / LVEDP: 128/10/16 mmHg  Left Ventriculography:  EF:50-65 %  Wall Motion:Normal to hyperdynamic  Coronary Anatomy:  Dominance: Co-dominant  Left Main: Large-caliber vessel that is short. Bifurcates into the LAD and Circumflex. Angiographically normal. KDT:OIZTI-WPYKDXI vessel with a very large proximal D1 Jayvion Stefanski that comes off just prior to a widely patent stent in the early mid vessel. There is a small second diagonal Atwell Mcdanel that arises from the stented segment and another small third diagonal Adeola Dennen more distally. The distal vessel tapers into a moderate caliber vessel that wraps  around the apex perfusing the distal third of the inferoapex.  D1:Large-caliber proximal Jmya Uliano with ostial 40-50% focal stenosis. The vessel then courses almost of the ramus intermedius. The vessel is very tortuous gives rise to several mold-moderate caliber branches distally. Beyond the ostial lesion, the vessel remains angiographically normal.  Left Circumflex:Moderate large-caliber, codominant vessel. There is a small moderate caliber first obtuse marginal Leveon Pelzer before the vessel courses into the AV groove. In the mid AV groove there is a hairpin turn as the vessel courses distally to provide to posterior lateral branches with the first being moderate caliber and second mean small caliber.  OM1:Small moderate caliber Jovi Zavadil that does not cover large distribution. Tortuous but free of disease.   RCA: Moderate large-caliber, codominant vessel. Minimal luminal irregularities are vessel tapers down to [place] more moderate caliber right posterior descending arteries. There is a very small posterior lateral system.  After reviewing the initial angiography, no culprit lesion was identified.   10/2015 PFTs Mild  ventilatory defect  11/2015 AAA screen [place] No aneurysm  04/2014 echo Study Conclusions  - Left ventricle: The cavity size was normal. Wall thickness was normal. Systolic function was normal. The estimated ejection fraction was in the range of 60% to 65%. Wall motion was normal; there were no regional wall motion abnormalities. Left ventricular diastolic function parameters were normal. - Mitral valve: There was mild regurgitation.   Assessment and Plan  1. CAD - no recent chest pain, has had some SOB/DOE, unclear if cardiac related - obtain echo, pending results likely obtain lexiscan   2. SOB - unclear if cardiac etiology, workup as described above    F/u 1 month   Arnoldo Lenis, M.D.

## 2017-05-15 NOTE — Patient Instructions (Signed)
Your physician wants you to follow-up in: [place]  Your physician recommends that you continue on your current medications as directed. Please refer to the Current Medication list given to you today.  Your physician has requested that you have an echocardiogram. Echocardiography is a painless test that uses sound waves to create images of your heart. It provides your doctor with information about the size and shape of your heart and how well your heart's chambers and valves are working. This procedure takes approximately one hour. There are no restrictions for this procedure.  Thank you for choosing [facility]!!

## 2017-05-16 ENCOUNTER — Ambulatory Visit: Payer: PPO | Admitting: Cardiology

## 2017-05-16 ENCOUNTER — Ambulatory Visit (HOSPITAL_COMMUNITY)
Admission: RE | Admit: 2017-05-16 | Discharge: 2017-05-16 | Disposition: A | Discharge status: Home / Self Care | Payer: PPO | Source: Ambulatory Visit | Attending: Cardiology | Admitting: Cardiology

## 2017-05-16 DIAGNOSIS — R0602 Shortness of breath: Secondary | ICD-10-CM | POA: Diagnosis not present

## 2017-05-16 DIAGNOSIS — E785 Hyperlipidemia, unspecified: Secondary | ICD-10-CM | POA: Insufficient documentation

## 2017-05-16 DIAGNOSIS — I119 Hypertensive heart disease without heart failure: Secondary | ICD-10-CM | POA: Diagnosis not present

## 2017-05-16 DIAGNOSIS — I2510 Atherosclerotic heart disease of native coronary artery without angina pectoris: Secondary | ICD-10-CM | POA: Diagnosis not present

## 2017-05-16 DIAGNOSIS — K219 Gastro-esophageal reflux disease without esophagitis: Secondary | ICD-10-CM | POA: Insufficient documentation

## 2017-05-16 DIAGNOSIS — Z955 Presence of coronary angioplasty implant and graft: Secondary | ICD-10-CM | POA: Diagnosis not present

## 2017-05-16 DIAGNOSIS — I358 Other nonrheumatic aortic valve disorders: Secondary | ICD-10-CM | POA: Insufficient documentation

## 2017-05-16 NOTE — Progress Notes (Signed)
*  PRELIMINARY RESULTS* Echocardiogram 2D Echocardiogram has been performed.  Samuel Germany 05/16/2017, 1:06 PM

## 2017-05-18 ENCOUNTER — Encounter: Payer: Self-pay | Admitting: *Deleted

## 2017-05-18 ENCOUNTER — Telehealth: Payer: Self-pay | Admitting: Cardiology

## 2017-05-18 ENCOUNTER — Telehealth: Payer: Self-pay | Admitting: *Deleted

## 2017-05-18 DIAGNOSIS — R0602 Shortness of breath: Secondary | ICD-10-CM

## 2017-05-18 NOTE — Telephone Encounter (Signed)
-----   Message from Weston Anna sent at 05/18/2017  1:33 PM EDT ----- May 28, 2017 Arrive at 8:30 at [facility]   ----- Message ----- From: Massie Maroon, CMA Sent: 05/18/2017   1:13 PM To: Weston Anna  Can you please schedule lexiscan for this pt? I can call him with date and time. Thank you   Shenee Wignall

## 2017-05-18 NOTE — Telephone Encounter (Signed)
Pt aware - mailed instruction letter

## 2017-05-18 NOTE — Telephone Encounter (Signed)
Pt aware and agreeable to lexiscan - orders placed and will forward to schedulers - routed to pcp

## 2017-05-18 NOTE — Telephone Encounter (Signed)
-----   Message from Arnoldo Lenis, MD sent at 05/18/2017 10:52 AM EDT ----- Echo looks good, heart function remains normal. Please order a lexiscan stress test for sob, does not need to hold any meds   Zandra Abts MD

## 2017-05-18 NOTE — Telephone Encounter (Signed)
Lexiscan  May 28, 2017 Arrive at 8:30 at [facility]

## 2017-05-20 ENCOUNTER — Encounter: Payer: Self-pay | Admitting: Cardiology

## 2017-05-28 ENCOUNTER — Encounter (HOSPITAL_COMMUNITY): Payer: Self-pay

## 2017-05-28 ENCOUNTER — Encounter (HOSPITAL_COMMUNITY): Payer: PPO

## 2017-05-28 ENCOUNTER — Encounter: Payer: Self-pay | Admitting: *Deleted

## 2017-05-31 ENCOUNTER — Encounter (HOSPITAL_COMMUNITY): Payer: Self-pay

## 2017-05-31 ENCOUNTER — Encounter (HOSPITAL_COMMUNITY)
Admission: RE | Admit: 2017-05-31 | Discharge: 2017-05-31 | Disposition: A | Discharge status: Home / Self Care | Payer: PPO | Source: Ambulatory Visit | Attending: Cardiology | Admitting: Cardiology

## 2017-05-31 DIAGNOSIS — R0602 Shortness of breath: Secondary | ICD-10-CM | POA: Diagnosis not present

## 2017-05-31 LAB — NM MYOCAR MULTI W/SPECT W/WALL MOTION / EF
CHL CUP NUCLEAR SRS: 0
CHL CUP NUCLEAR SSS: 4
LV sys vol: 20 mL
LVDIAVOL: 84 mL (ref 62–150)
NUC STRESS TID: 0.94
Peak HR: 90 {beats}/min
RATE: 0.41
Rest HR: 57 {beats}/min
SDS: 4

## 2017-05-31 MED ORDER — TECHNETIUM TC 99M TETROFOSMIN IV KIT
10.0000 | PACK | Freq: Once | INTRAVENOUS | Status: AC | PRN
  Administered 2017-05-31: 10.5 via INTRAVENOUS

## 2017-05-31 MED ORDER — TECHNETIUM TC 99M TETROFOSMIN IV KIT
30.0000 | PACK | Freq: Once | INTRAVENOUS | Status: AC | PRN
  Administered 2017-05-31: 33 via INTRAVENOUS

## 2017-05-31 MED ORDER — REGADENOSON 0.4 MG/5ML IV SOLN
INTRAVENOUS | Status: AC
  Administered 2017-05-31: 0.4 mg via INTRAVENOUS
  Filled 2017-05-31: qty 5

## 2017-05-31 MED ORDER — SODIUM CHLORIDE 0.9% FLUSH
INTRAVENOUS | Status: AC
  Administered 2017-05-31: 10 mL via INTRAVENOUS
  Filled 2017-05-31: qty 10

## 2017-06-01 ENCOUNTER — Encounter: Payer: Self-pay | Admitting: *Deleted

## 2017-06-01 NOTE — Progress Notes (Signed)
Patient walked into office reporting dizziness that he's had for the past 2 months and said that he discussed this with Dr. Harl Bowie already. Patient reports that the dizziness has gotten worse and that it comes and goes mostly when he stands up and last about 30 seconds. No c/o chest pain, n/v or sob. Patient drove to the office today and has his wife with him. Patient said that he monitors his BP & HR at home. Patient advised to continue monitoring his BP and HR and advised to change positions slowly, keep already scheduled appointment for next Wednesday 06/06/17 @11 :20 am with Dr. Harl Bowie to discuss worsening dizziness. Advised that if his symptoms get worse, that he needed to go to the ED for an evaluation. Verbalized understanding of plan.

## 2017-06-01 NOTE — Progress Notes (Signed)
Spoke with patient and he said he is staying hydrated. Patient verbalized understanding of dose decrease with toprol.

## 2017-06-01 NOTE — Progress Notes (Signed)
Lower toprol to 12.5mg  daily. Is he staying well hydrated? Should be drinking a minimum of 4 bottles of water a day   Carlyle Dolly MD

## 2017-06-04 ENCOUNTER — Telehealth: Payer: Self-pay | Admitting: *Deleted

## 2017-06-04 NOTE — Telephone Encounter (Signed)
-----   Message from Arnoldo Lenis, MD sent at 06/01/2017  3:20 PM EDT ----- Stress test shows only a small mildly abnormal area that is overall low risk, will discuss further at our upcoming f/u   Zandra Abts MD

## 2017-06-04 NOTE — Telephone Encounter (Signed)
Patient informed. 

## 2017-06-06 ENCOUNTER — Telehealth: Payer: Self-pay | Admitting: Cardiology

## 2017-06-06 ENCOUNTER — Ambulatory Visit: Payer: PPO | Admitting: Cardiology

## 2017-06-06 ENCOUNTER — Other Ambulatory Visit: Payer: Self-pay

## 2017-06-06 ENCOUNTER — Encounter: Payer: Self-pay | Admitting: Cardiology

## 2017-06-06 VITALS — BP 170/91 | HR 84 | Wt 163.0 lb

## 2017-06-06 DIAGNOSIS — I10 Essential (primary) hypertension: Secondary | ICD-10-CM | POA: Diagnosis not present

## 2017-06-06 DIAGNOSIS — I25119 Atherosclerotic heart disease of native coronary artery with unspecified angina pectoris: Secondary | ICD-10-CM

## 2017-06-06 DIAGNOSIS — I2510 Atherosclerotic heart disease of native coronary artery without angina pectoris: Secondary | ICD-10-CM | POA: Diagnosis not present

## 2017-06-06 DIAGNOSIS — R0602 Shortness of breath: Secondary | ICD-10-CM | POA: Diagnosis not present

## 2017-06-06 LAB — PROTIME-INR

## 2017-06-06 MED ORDER — RANOLAZINE ER 500 MG PO TB12: 500.0000 mg | ORAL_TABLET | Freq: Two times a day (BID) | ORAL | 3 refills | Status: DC

## 2017-06-06 NOTE — Telephone Encounter (Signed)
[facility] HEART CATH 5/6 @ 10:30AM DR [place]

## 2017-06-06 NOTE — Patient Instructions (Addendum)
Your physician recommends that you schedule a follow-up appointment in: [place] has recommended you make the following change in your medication:   START RANEXA [place] [id number] Dept: [phone] Loc: [facility]  06/06/2017  You are scheduled for a Cardiac Catheterization on Monday, May 6 with Dr. Peter Martinique.  1. Please arrive at the [facility] (Main Entrance A) at [facility]: [address] [place], [place] [address] at 8:00 AM (two hours before your procedure to ensure your preparation). Free valet parking service is available.   Special note: Every effort is made to have your procedure done on time. Please understand that emergencies sometimes delay scheduled procedures.  2. Diet: Do not eat or drink anything after midnight prior to your procedure except sips of water to take medications.  3. Labs: WE HAVE GIVEN YOU LAB ORDERS - PLEASE HAVE DONE PRIOR TO CATH ON Monday   4. Medication instructions in preparation for your procedure: DO NOT NEED TO HOLD ANY MEDICATIONS PRIOR TO PROCEDURE     On the morning of your procedure, take your Aspirin and any morning medicines NOT listed above.  You may use sips of water.  5. Plan for one night stay--bring personal belongings. 6. Bring a current list of your medications and current insurance cards. 7. You MUST have a responsible person to drive you home. 8. Someone MUST be with you the first 24 hours after you arrive home or your discharge will be delayed. 9. Please wear clothes that are easy to get on and off and wear slip-on shoes.  Thank you for allowing [place] to care for you!   -- [facility] .  Thank you for choosing [facility]!!

## 2017-06-06 NOTE — [place] Notes (Signed)
Clinical Summary Mr. Sheeler is a 77 y.o.male seen today for follow up of the following medical problem.s    1. CAD - hx of prior stenting 10 years ago - cath 04/09/14 with patent coronaries.  - compliant with meds  Recent SOB and chest pain symptoms as decribed below.    2. SOB - 10/2015 PFTs mild ventilatory defect  -ER visit 05/07/17 with SOB x several weeks. Normal labs with Hgb 13, trop neg x 1. BNP 104.  CXR no acute process. EKG SR, no acute ischemic changes  - SOB increased over the last month.  - DOE with walking to [employer]. No chest pain. Has to stop and catch and breath - some cough, nonproductive. No wheezing. No recent edema. No orthopnea.  - weight down 168 to 164 - 05/2017 echo LVEF 60-65%, no WMAs, diastolic function not described.  - 05/2017 lexiscan small apical ischemia, low risk  - increased SOB/DOE even since last visit.  - SOB just room to room at home now. Mild cough, wheezing. Mild tightness in chest, with activity. 5-6/10 in severity. Lasts about 30 minutes, few times a day. Chronic neck pain.      3. Dizzy spells - never occurs with lying or sitting.  - mainly with first standing. .  - 4-5 bottles of day per day. Not better with hydration.       Past Medical History:  Diagnosis Date  . Arthritis    "neck, hands, fingers" (04/09/2014)  . Bleeds easily ([place])   . CAD (coronary artery disease)   . Hyperlipemia   . Hypertension   . Kidney stones    "I've had several; had to go in and get them once" (04/09/2014)  . Pneumonia 1940's     Allergies  Allergen Reactions  . Vancomycin Rash     Current Outpatient Medications  Medication Sig Dispense Refill  . acetaminophen (TYLENOL) 500 MG tablet Take 1,000 mg by mouth every 6 (six) hours as needed for moderate pain or headache.    . albuterol (PROVENTIL HFA;VENTOLIN HFA) 108 (90 Base) MCG/ACT inhaler Inhale 1 puff into the lungs as needed for wheezing or shortness of breath. (Patient  taking differently: Inhale 1 puff into the lungs every 6 (six) hours as needed for wheezing or shortness of breath. ) 1 Inhaler 2  . atorvastatin (LIPITOR) 80 MG tablet Take 80 mg by mouth at bedtime.     . Cholecalciferol (VITAMIN D3 PO) Take 1 capsule by mouth daily.    . diphenhydramine-acetaminophen (TYLENOL PM) 25-500 MG TABS tablet Take 2 tablets by mouth at bedtime.    Marland Kitchen doxazosin (CARDURA) 8 MG tablet Take 8 mg by mouth at bedtime.     . metoprolol succinate (TOPROL-XL) 25 MG 24 hr tablet Take 12.5 mg by mouth daily.     . Multiple Vitamin (MULTIVITAMIN) tablet Take 1 tablet by mouth daily.    . nitroGLYCERIN (NITROSTAT) 0.4 MG SL tablet Place 1 tablet (0.4 mg total) under the tongue every 5 (five) minutes as needed for chest pain. 25 tablet 3  . pantoprazole (PROTONIX) 40 MG tablet TAKE 1 TABLET BY MOUTH DAILY (Patient taking differently: Take 40 mg by mouth daily) 30 tablet 6  . senna (SENOKOT) 8.6 MG tablet Take 2 tablets by mouth at bedtime.      No current facility-administered medications for this visit.      Past Surgical History:  Procedure Laterality Date  . ANKLE FRACTURE SURGERY Right 2009  .  CARDIAC CATHETERIZATION  1990's X 2;  04/09/2014  . COLONOSCOPY N/A 01/22/2017   Procedure: COLONOSCOPY;  Surgeon: Danie Binder, MD;  [facility];  Service: Endoscopy;  Laterality: N/A;  12:30pm  . CORONARY ANGIOPLASTY WITH STENT PLACEMENT  2006   "2"  . CYSTOSCOPY W/ STONE MANIPULATION  ~ 2008  . ESOPHAGOGASTRODUODENOSCOPY N/A 01/22/2017   Procedure: ESOPHAGOGASTRODUODENOSCOPY (EGD);  Surgeon: Danie Binder, MD;  Location: [facility];  Service: Endoscopy;  Laterality: N/A;  . FRACTURE SURGERY    . LEFT HEART CATHETERIZATION WITH CORONARY ANGIOGRAM N/A 04/09/2014   Procedure: LEFT HEART CATHETERIZATION WITH CORONARY ANGIOGRAM;  Surgeon: Leonie Man, MD;  Location: [facility] CATH LAB;  Service: Cardiovascular;  Laterality: N/A;  . NASAL SEPTUM SURGERY  1999  .  POLYPECTOMY  01/22/2017   Procedure: POLYPECTOMY;  Surgeon: Danie Binder, MD;  Location: [facility];  Service: Endoscopy;;  right colon x4     Allergies  Allergen Reactions  . Vancomycin Rash      Family History  Problem Relation Age of Onset  . Heart disease Mother   . Colon cancer Neg Hx   . Gastric cancer Neg Hx   . Esophageal cancer Neg Hx      Social History Mr. Perine reports that he quit smoking about 53 years ago. His smoking use included cigarettes. He started smoking about 74 years ago. He has a 33.00 pack-year smoking history. He has never used smokeless tobacco. Mr. Voorhies reports that he does not drink alcohol.   Review of Systems CONSTITUTIONAL: No weight loss, fever, chills, weakness or fatigue.  HEENT: Eyes: No visual loss, blurred vision, double vision or yellow sclerae.No hearing loss, sneezing, congestion, runny nose or sore throat.  SKIN: No rash or itching.  CARDIOVASCULAR: per hpi RESPIRATORY: per hpi GASTROINTESTINAL: No anorexia, nausea, vomiting or diarrhea. No abdominal pain or blood.  GENITOURINARY: No burning on urination, no polyuria NEUROLOGICAL:+dizziess MUSCULOSKELETAL: No muscle, back pain, joint pain or stiffness.  LYMPHATICS: No enlarged nodes. No history of splenectomy.  PSYCHIATRIC: No history of depression or anxiety.  ENDOCRINOLOGIC: No reports of sweating, cold or heat intolerance. No polyuria or polydipsia.  Marland Kitchen   Physical Examination Vitals:   06/06/17 1115  BP: (!) 170/91  Pulse: 84  SpO2: 96%   Vitals:   06/06/17 1115  Weight: 163 lb (73.9 kg)    Gen: resting comfortably, no acute distress HEENT: no scleral icterus, pupils equal round and reactive, no palptable cervical adenopathy,  CV: RRR, no m/r/g, no jvd Resp: Clear to auscultation bilaterally GI: abdomen is soft, non-tender, non-distended, normal bowel sounds, no hepatosplenomegaly MSK: extremities are warm, no edema.  Skin: warm, no rash Neuro:  no focal  deficits Psych: appropriate affect   Diagnostic Studies  04/09/14 Cath Hemodynamics:  Central Aortic Pressure / Mean: 126/67/92 mmHg  Left Ventricular Pressure / LVEDP: 128/10/16 mmHg  Left Ventriculography:  EF:50-65 %  Wall Motion:Normal to hyperdynamic  Coronary Anatomy:  Dominance: Co-dominant  Left Main: Large-caliber vessel that is short. Bifurcates into the LAD and Circumflex. Angiographically normal. DXI:PJASN-[id number] vessel with a very large proximal D1 Dylin Breeden that comes off just prior to a widely patent stent in the early mid vessel. There is a small second diagonal Akhila Mahnken that arises from the stented segment and another small third diagonal Karlissa Aron more distally. The distal vessel tapers into a moderate caliber vessel that wraps around the apex perfusing the distal third of the inferoapex.  D1:Large-caliber proximal Bellina Tokarczyk  with ostial 40-50% focal stenosis. The vessel then courses almost of the ramus intermedius. The vessel is very tortuous gives rise to several mold-moderate caliber branches distally. Beyond the ostial lesion, the vessel remains angiographically normal.  Left Circumflex:Moderate large-caliber, codominant vessel. There is a small moderate caliber first obtuse marginal Laurelle Skiver before the vessel courses into the AV groove. In the mid AV groove there is a hairpin turn as the vessel courses distally to provide to posterior lateral branches with the first being moderate caliber and second mean small caliber.  OM1:Small moderate caliber Koichi Platte that does not cover large distribution. Tortuous but free of disease.   RCA: Moderate large-caliber, codominant vessel. Minimal luminal irregularities are vessel tapers down to [place] more moderate caliber right posterior descending arteries. There is a very small posterior lateral system.  After reviewing the initial angiography, no culprit lesion was identified.   10/2015 PFTs Mild ventilatory  defect  11/2015 AAA screen [place] No aneurysm  04/2014 echo Study Conclusions  - Left ventricle: The cavity size was normal. Wall thickness was normal. Systolic function was normal. The estimated ejection fraction was in the range of 60% to 65%. Wall motion was normal; there were no regional wall motion abnormalities. Left ventricular diastolic function parameters were normal. - Mitral valve: There was mild regurgitation.  05/2017 nuclear stress  No diagnostic ST segment changes to indicate ischemia.  Small, mild intensity, apical inferior defect that is reversible and consistent with a small ischemic territory. Fixed inferior defect is more consistent with soft tissue attenuation.  This is a low risk study.  Nuclear stress EF: 76%.  Assessment and Plan  1. CAD/chest pain/SOB - significant progression of symptoms over last few weeks, now SOB/DOE just walking room to room at home.  - recent chest tightness with activities - stress test mild apical ischemia, low risk. Antianginal therapy limited due to orthostatic symptoms. - due to signifciant progression of symptoms will plan for RHC/LHC, ask for RHC due to the significant symptoms of DOE out or proportion to any current cardiac findings at this time.    I have reviewed the risks, indications, and alternatives to cardiac catheterization, possible angioplasty, and stenting with the patient today. Risks include but are not limited to bleeding, infection, vascular injury, stroke, myocardial infection, arrhythmia, kidney injury, radiation-related injury in the case of prolonged fluoroscopy use, emergency cardiac surgery, and death. The patient understands the risks of serious complication is 1-2 in 2778 with diagnostic cardiac cath and 1-2% or less with angioplasty/stenting.         Arnoldo Lenis, M.D.

## 2017-06-07 ENCOUNTER — Other Ambulatory Visit: Payer: Self-pay | Admitting: Cardiology

## 2017-06-07 ENCOUNTER — Telehealth: Payer: Self-pay | Admitting: *Deleted

## 2017-06-07 ENCOUNTER — Observation Stay (HOSPITAL_COMMUNITY)
Admission: EM | Admit: 2017-06-07 | Discharge: 2017-06-08 | Disposition: A | Discharge status: Home / Self Care | Payer: PPO | Attending: Cardiology | Admitting: Cardiology

## 2017-06-07 ENCOUNTER — Encounter (HOSPITAL_COMMUNITY): Payer: Self-pay | Admitting: *Deleted

## 2017-06-07 ENCOUNTER — Emergency Department (HOSPITAL_COMMUNITY): Payer: PPO

## 2017-06-07 ENCOUNTER — Encounter: Payer: Self-pay | Admitting: Cardiology

## 2017-06-07 ENCOUNTER — Other Ambulatory Visit: Payer: Self-pay

## 2017-06-07 DIAGNOSIS — Z87891 Personal history of nicotine dependence: Secondary | ICD-10-CM | POA: Diagnosis not present

## 2017-06-07 DIAGNOSIS — Z79899 Other long term (current) drug therapy: Secondary | ICD-10-CM | POA: Insufficient documentation

## 2017-06-07 DIAGNOSIS — Z8249 Family history of ischemic heart disease and other diseases of the circulatory system: Secondary | ICD-10-CM | POA: Insufficient documentation

## 2017-06-07 DIAGNOSIS — I25110 Atherosclerotic heart disease of native coronary artery with unstable angina pectoris: Secondary | ICD-10-CM | POA: Diagnosis not present

## 2017-06-07 DIAGNOSIS — R0602 Shortness of breath: Secondary | ICD-10-CM | POA: Diagnosis not present

## 2017-06-07 DIAGNOSIS — I200 Unstable angina: Secondary | ICD-10-CM | POA: Diagnosis not present

## 2017-06-07 DIAGNOSIS — R0789 Other chest pain: Secondary | ICD-10-CM

## 2017-06-07 DIAGNOSIS — Z955 Presence of coronary angioplasty implant and graft: Secondary | ICD-10-CM | POA: Diagnosis not present

## 2017-06-07 DIAGNOSIS — I10 Essential (primary) hypertension: Secondary | ICD-10-CM | POA: Diagnosis not present

## 2017-06-07 DIAGNOSIS — R079 Chest pain, unspecified: Secondary | ICD-10-CM | POA: Diagnosis not present

## 2017-06-07 DIAGNOSIS — Z7982 Long term (current) use of aspirin: Secondary | ICD-10-CM | POA: Diagnosis not present

## 2017-06-07 DIAGNOSIS — Z881 Allergy status to other antibiotic agents status: Secondary | ICD-10-CM | POA: Diagnosis not present

## 2017-06-07 DIAGNOSIS — E785 Hyperlipidemia, unspecified: Secondary | ICD-10-CM | POA: Insufficient documentation

## 2017-06-07 LAB — TROPONIN I: Troponin I: <0.03 ng/mL (ref <0.03)

## 2017-06-07 LAB — CBC
HCT: 36 % — ABNORMAL LOW (ref 39.0–52.0)
HEMOGLOBIN: 12.4 g/dL — AB (ref 13.0–17.0)
MCH: 31.9 pg (ref 26.0–34.0)
MCHC: 34.4 g/dL (ref 30.0–36.0)
MCV: 92.5 fL (ref 78.0–100.0)
Platelets: 169 10*3/uL (ref 150–400)
RBC: 3.89 MIL/uL — ABNORMAL LOW (ref 4.22–5.81)
RDW: 15 % (ref 11.5–15.5)
WBC: 7.2 10*3/uL (ref 4.0–10.5)

## 2017-06-07 LAB — BASIC METABOLIC PANEL
ANION GAP: 6 (ref 5–15)
BUN: 17 mg/dL (ref 6–20)
CHLORIDE: 106 mmol/L (ref 101–111)
CO2: 25 mmol/L (ref 22–32)
Calcium: 8.8 mg/dL — ABNORMAL LOW (ref 8.9–10.3)
Creatinine, Ser: 1.12 mg/dL (ref 0.61–1.24)
GFR calc Af Amer: >60 mL/min (ref >60)
GLUCOSE: 198 mg/dL — AB (ref 65–99)
Potassium: 3.9 mmol/L (ref 3.5–5.1)
Sodium: 137 mmol/L (ref 135–145)

## 2017-06-07 MED ORDER — PANTOPRAZOLE SODIUM 40 MG PO TBEC
40.0000 mg | DELAYED_RELEASE_TABLET | Freq: Every day | ORAL | Status: DC
  Administered 2017-06-08: 40 mg via ORAL
  Filled 2017-06-07: qty 1

## 2017-06-07 MED ORDER — DIPHENHYDRAMINE HCL 25 MG PO CAPS
50.0000 mg | ORAL_CAPSULE | Freq: Every day | ORAL | Status: DC
  Administered 2017-06-08: 50 mg via ORAL
  Filled 2017-06-07: qty 2

## 2017-06-07 MED ORDER — ASPIRIN 81 MG PO CHEW: 324.0000 mg | CHEWABLE_TABLET | ORAL | Status: DC

## 2017-06-07 MED ORDER — NITROGLYCERIN 2 % TD OINT
1.0000 [in_us] | TOPICAL_OINTMENT | Freq: Once | TRANSDERMAL | Status: AC
  Administered 2017-06-07: 1 [in_us] via TOPICAL
  Filled 2017-06-07: qty 1

## 2017-06-07 MED ORDER — ADULT MULTIVITAMIN W/MINERALS CH
1.0000 | ORAL_TABLET | Freq: Every day | ORAL | Status: DC
  Administered 2017-06-08: 1 via ORAL
  Filled 2017-06-07: qty 1

## 2017-06-07 MED ORDER — ATORVASTATIN CALCIUM 80 MG PO TABS
80.0000 mg | ORAL_TABLET | Freq: Every day | ORAL | Status: DC
  Administered 2017-06-08: 80 mg via ORAL
  Filled 2017-06-07: qty 1

## 2017-06-07 MED ORDER — ACETAMINOPHEN 325 MG PO TABS
650.0000 mg | ORAL_TABLET | ORAL | Status: DC | PRN
  Administered 2017-06-08: 650 mg via ORAL
  Filled 2017-06-07: qty 2

## 2017-06-07 MED ORDER — LORATADINE 10 MG PO TABS
10.0000 mg | ORAL_TABLET | Freq: Every day | ORAL | Status: DC
  Administered 2017-06-08: 10 mg via ORAL
  Filled 2017-06-07: qty 1

## 2017-06-07 MED ORDER — ACETAMINOPHEN 500 MG PO TABS
1000.0000 mg | ORAL_TABLET | Freq: Once | ORAL | Status: AC
  Administered 2017-06-07: 1000 mg via ORAL
  Filled 2017-06-07: qty 2

## 2017-06-07 MED ORDER — ENOXAPARIN SODIUM 40 MG/0.4ML ~~LOC~~ SOLN
40.0000 mg | SUBCUTANEOUS | Status: DC
  Filled 2017-06-07: qty 0.4

## 2017-06-07 MED ORDER — NITROGLYCERIN 0.4 MG SL SUBL
0.4000 mg | SUBLINGUAL_TABLET | SUBLINGUAL | Status: DC | PRN
  Administered 2017-06-07 (×2): 0.4 mg via SUBLINGUAL
  Filled 2017-06-07 (×2): qty 1

## 2017-06-07 MED ORDER — SENNA 8.6 MG PO TABS
2.0000 | ORAL_TABLET | Freq: Every day | ORAL | Status: DC
  Administered 2017-06-08: 17.2 mg via ORAL
  Filled 2017-06-07: qty 2

## 2017-06-07 MED ORDER — MECLIZINE HCL 25 MG PO TABS: 25.0000 mg | ORAL_TABLET | Freq: Two times a day (BID) | ORAL | Status: DC | PRN

## 2017-06-07 MED ORDER — PNEUMOCOCCAL VAC POLYVALENT 25 MCG/0.5ML IJ INJ: 0.5000 mL | INJECTION | INTRAMUSCULAR | Status: DC

## 2017-06-07 MED ORDER — DIPHENHYDRAMINE-APAP (SLEEP) 25-500 MG PO TABS: 2.0000 | ORAL_TABLET | Freq: Every day | ORAL | Status: DC

## 2017-06-07 MED ORDER — ONDANSETRON HCL 4 MG/2ML IJ SOLN
4.0000 mg | Freq: Four times a day (QID) | INTRAMUSCULAR | Status: DC | PRN
Start: 2017-06-07 — End: 2017-06-08

## 2017-06-07 MED ORDER — METOPROLOL SUCCINATE ER 25 MG PO TB24
12.5000 mg | ORAL_TABLET | Freq: Every day | ORAL | Status: DC
  Administered 2017-06-08: 12.5 mg via ORAL
  Filled 2017-06-07: qty 1

## 2017-06-07 MED ORDER — DOXAZOSIN MESYLATE 8 MG PO TABS
8.0000 mg | ORAL_TABLET | Freq: Every day | ORAL | Status: DC
  Administered 2017-06-08: 8 mg via ORAL
  Filled 2017-06-07 (×2): qty 1

## 2017-06-07 MED ORDER — ALBUTEROL SULFATE (2.5 MG/3ML) 0.083% IN NEBU
2.5000 mg | INHALATION_SOLUTION | Freq: Four times a day (QID) | RESPIRATORY_TRACT | Status: DC | PRN
  Filled 2017-06-07: qty 3

## 2017-06-07 MED ORDER — ASPIRIN 81 MG PO CHEW
324.0000 mg | CHEWABLE_TABLET | Freq: Once | ORAL | Status: AC
  Administered 2017-06-07: 324 mg via ORAL
  Filled 2017-06-07: qty 4

## 2017-06-07 MED ORDER — RANOLAZINE ER 500 MG PO TB12
500.0000 mg | ORAL_TABLET | Freq: Two times a day (BID) | ORAL | Status: DC
  Administered 2017-06-08 (×2): 500 mg via ORAL
  Filled 2017-06-07 (×2): qty 1

## 2017-06-07 MED ORDER — ASPIRIN EC 81 MG PO TBEC
81.0000 mg | DELAYED_RELEASE_TABLET | Freq: Every day | ORAL | Status: DC
  Administered 2017-06-08: 81 mg via ORAL
  Filled 2017-06-07: qty 1

## 2017-06-07 MED ORDER — ACETAMINOPHEN 500 MG PO TABS
1000.0000 mg | ORAL_TABLET | Freq: Every day | ORAL | Status: DC
  Administered 2017-06-08: 1000 mg via ORAL
  Filled 2017-06-07: qty 2

## 2017-06-07 MED ORDER — POLYVINYL ALCOHOL 1.4 % OP SOLN
1.0000 [drp] | Freq: Every day | OPHTHALMIC | Status: DC | PRN
  Filled 2017-06-07: qty 15

## 2017-06-07 MED ORDER — ASPIRIN 300 MG RE SUPP: 300.0000 mg | RECTAL | Status: DC

## 2017-06-07 MED ORDER — NITROGLYCERIN IN D5W 200-5 MCG/ML-% IV SOLN: 0.0000 ug/min | Freq: Once | INTRAVENOUS | Status: DC

## 2017-06-07 NOTE — ED Notes (Signed)
Istat trop = 0.00 at 16:55, results not crossed over into the chart due to barcode scanning error. MD Regenia Skeeter notified of results, edit sheet faxed to [place].

## 2017-06-07 NOTE — H&P (Signed)
Cardiology Admission History and Physical:   Patient ID: Eric Benitez; MRN: [id number]; DOB: 11-29-1940   Admission date: 06/07/2017  Primary Care Provider: Caryl Bis, MD Primary Cardiologist: Carlyle Dolly, MD    Chief Complaint:  Chest pain  Patient Profile:   Eric Benitez is a 77 y.o. male with a history of CAD who presents to the ED with chest pain.  History of Present Illness:   Mr. Eric Benitez is a 77 y/o male with a history of remote CAD. He had a PCI 10 years ago, LAD DES. Cath in March 2016 showed a patent LAD stent and a 40-50% Dx lesion. EF 55%. He saw Dr Harl Bowie 05/15/17 and complained of DOE and chest tightness (walking to his mailbox). An Op Myoview suggested some apical ischemia but was overall read as low risk. The pt continued to complain of symptoms, and in fact complained of increasing symptoms. He saw dr Harl Bowie in the office 06/06/17 and the plan was to proceed to cath. The pt has also had what sounds like orthostatic symptoms so Dr Harl Bowie has not been abl;e to increase the pt's antianginal Rx. The pt presented to [facility] today with chest tightness associated with sweating. He is admitted now for further evaluation.    Past Medical History:  Diagnosis Date  . Arthritis    "neck, hands, fingers" (04/09/2014)  . Bleeds easily ([place])   . CAD (coronary artery disease)   . Hyperlipemia   . Hypertension   . Kidney stones    "I've had several; had to go in and get them once" (04/09/2014)  . Pneumonia 1940's    Past Surgical History:  Procedure Laterality Date  . ANKLE FRACTURE SURGERY Right 2009  . CARDIAC CATHETERIZATION  1990's X 2;  04/09/2014  . COLONOSCOPY N/A 01/22/2017   Procedure: COLONOSCOPY;  Surgeon: Danie Binder, MD;  [facility];  Service: Endoscopy;  Laterality: N/A;  12:30pm  . CORONARY ANGIOPLASTY WITH STENT PLACEMENT  2006   "2"  . CYSTOSCOPY W/ STONE MANIPULATION  ~ 2008  . ESOPHAGOGASTRODUODENOSCOPY N/A 01/22/2017   Procedure:  ESOPHAGOGASTRODUODENOSCOPY (EGD);  Surgeon: Danie Binder, MD;  [facility];  Service: Endoscopy;  Laterality: N/A;  . FRACTURE SURGERY    . LEFT HEART CATHETERIZATION WITH CORONARY ANGIOGRAM N/A 04/09/2014   Procedure: LEFT HEART CATHETERIZATION WITH CORONARY ANGIOGRAM;  Surgeon: Leonie Man, MD;  [facility] CATH LAB;  Service: Cardiovascular;  Laterality: N/A;  . NASAL SEPTUM SURGERY  1999  . POLYPECTOMY  01/22/2017   Procedure: POLYPECTOMY;  Surgeon: Danie Binder, MD;  [facility];  Service: Endoscopy;;  right colon x4     Medications Prior to Admission: Prior to Admission medications   Medication Sig Start Date End Date Taking? Authorizing Provider  acetaminophen (TYLENOL) 500 MG tablet Take 1,000 mg by mouth every 6 (six) hours as needed for moderate pain or headache.   Yes [provider]  albuterol (PROVENTIL HFA;VENTOLIN HFA) 108 (90 Base) MCG/ACT inhaler Inhale 1 puff into the lungs as needed for wheezing or shortness of breath. Patient taking differently: Inhale 1-2 puffs into the lungs every 6 (six) hours as needed for wheezing or shortness of breath.  11/03/15  Yes BranchAlphonse Guild, MD  aspirin EC 81 MG tablet Take 81 mg by mouth daily.   Yes [provider]  atorvastatin (LIPITOR) 80 MG tablet Take 80 mg by mouth at bedtime.    Yes [provider]  Cholecalciferol (VITAMIN D3 PO) Take 1 capsule by mouth at bedtime.    Yes [provider]  Cyanocobalamin (VITAMIN B-12 PO) Take 1 tablet by mouth daily.   Yes [provider]  diphenhydramine-acetaminophen (TYLENOL PM) 25-500 MG TABS tablet Take 2 tablets by mouth at bedtime.   Yes [provider]  doxazosin (CARDURA) 8 MG tablet Take 8 mg by mouth at bedtime.    Yes [provider]  Hypromellose (ARTIFICIAL TEARS OP) Place 1-2 drops into both eyes daily as needed (for dry eyes).   Yes [provider]  loratadine (CLARITIN) 10 MG  tablet Take 10 mg by mouth daily.   Yes [provider]  meclizine (ANTIVERT) 25 MG tablet Take 25 mg by mouth daily.   Yes [provider]  metoprolol succinate (TOPROL-XL) 25 MG 24 hr tablet Take 12.5 mg by mouth daily.    Yes [provider]  Multiple Vitamin (MULTIVITAMIN) tablet Take 1 tablet by mouth daily.   Yes [provider]  nitroGLYCERIN (NITROSTAT) 0.4 MG SL tablet Place 1 tablet (0.4 mg total) under the tongue every 5 (five) minutes as needed for chest pain. 05/08/17  Yes Branch, Alphonse Guild, MD  pantoprazole (PROTONIX) 40 MG tablet TAKE 1 TABLET BY MOUTH DAILY Patient taking differently: Take 40 mg by mouth daily 11/10/15  Yes Branch, Alphonse Guild, MD  ranolazine (RANEXA) 500 MG 12 hr tablet Take 1 tablet (500 mg total) by mouth 2 (two) times daily. 06/06/17  Yes Branch, Alphonse Guild, MD  senna (SENOKOT) 8.6 MG tablet Take 2 tablets by mouth at bedtime.    Yes [provider]     Allergies:    Allergies  Allergen Reactions  . Vancomycin Rash    Social History:   Social History   Socioeconomic History  . Marital status: Married    Spouse name: Not on file  . Number of children: Not on file  . Years of education: Not on file  . Highest education level: Not on file  Occupational History  . Not on file  Social Needs  . Financial resource strain: Not on file  . Food insecurity:    Worry: Not on file    Inability: Not on file  . Transportation needs:    Medical: Not on file    Non-medical: Not on file  Tobacco Use  . Smoking status: Former Smoker    Packs/day: 1.50    Years: 22.00    Pack years: 33.00    Types: Cigarettes    Start date: 11/28/1942    Last attempt to quit: 02/07/1964    Years since quitting: 53.3  . Smokeless tobacco: Never Used  Substance and Sexual Activity  . Alcohol use: No    Alcohol/week: 0.0 oz    Frequency: Never  . Drug use: No  . Sexual activity: Yes  Lifestyle  . Physical activity:    Days per  week: Not on file    Minutes per session: Not on file  . Stress: Not on file  Relationships  . Social connections:    Talks on phone: Not on file    Gets together: Not on file    Attends religious service: Not on file    Active member of club or organization: Not on file    Attends meetings of clubs or organizations: Not on file    Relationship status: Not on file  . Intimate partner violence:    Fear of current or ex partner:  Not on file    Emotionally abused: Not on file    Physically abused: Not on file    Forced sexual activity: Not on file  Other Topics Concern  . Not on file  Social History Narrative  . Not on file    Family History:   The patient's family history includes Heart disease in his mother. There is no history of Colon cancer, Gastric cancer, or Esophageal cancer.    ROS:  Please see the history of present illness. No fevers, chills, no orthopnea.  All other ROS reviewed and negative.     Physical Exam/Data:   Vitals:   06/07/17 1615 06/07/17 1630 06/07/17 1745 06/07/17 1815  BP: 130/72 133/73 134/74   Pulse: 90 86 80 85  Resp: 18 11 18 19   Temp:      TempSrc:      SpO2: 99% 98% 99% 99%   No intake or output data in the 24 hours ending 06/07/17 1837 There were no vitals filed for this visit. There is no height or weight on file to calculate BMI.  General:  Well nourished, well developed, in no acute distress HEENT: normal Lymph: no adenopathy Neck: no JVD Endocrine:  No thryomegaly Vascular: No carotid bruits  Cardiac:  normal S1, S2; RRR; no murmur  Lungs:  clear to auscultation bilaterally, no wheezing, rhonchi or rales  Abd: soft, nontender, no hepatomegaly  Ext: no edema Musculoskeletal:  No deformities, BUE and BLE strength normal and equal Skin: warm and dry  Neuro:  CNs 2-12 intact, no focal abnormalities noted Psych:  Normal affect , mildly nervous   EKG:  The ECG that was done 06/07/17 was personally reviewed and demonstrates NSR-HR  78  Relevant CV Studies: Myoview 05/31/17-  No diagnostic ST segment changes to indicate ischemia.  Small, mild intensity, apical inferior defect that is reversible and consistent with a small ischemic territory. Fixed inferior defect is more consistent with soft tissue attenuation.  This is a low risk study.  Nuclear stress EF: 76%.   Echo 05/16/17- Study Conclusions  - Left ventricle: The cavity size was normal. Wall thickness was   increased in a pattern of mild LVH. Systolic function was normal.   The estimated ejection fraction was in the range of 60% to 65%.   Wall motion was normal; there were no regional wall motion   abnormalities. - Aortic valve: Mildly calcified annulus. Trileaflet. - Mitral valve: Mildly thickened leaflets . There was trivial   regurgitation. - Right atrium: Central venous pressure (est): 3 mm Hg. - Atrial septum: No defect or patent foramen ovale was identified. - Tricuspid valve: There was trivial regurgitation. - Pulmonary arteries: Systolic pressure could not be accurately   estimated. - Pericardium, extracardiac: There was no pericardial effusion.   Laboratory Data:  Chemistry Recent Labs  Lab 06/07/17 1646  NA 137  K 3.9  CL 106  CO2 25  GLUCOSE 198*  BUN 17  CREATININE 1.12  CALCIUM 8.8*  GFRNONAA >60  GFRAA >60  ANIONGAP 6    No results for input(s): PROT, ALBUMIN, AST, ALT, ALKPHOS, BILITOT in the last 168 hours. Hematology Recent Labs  Lab 06/07/17 1646  WBC 7.2  RBC 3.89*  HGB 12.4*  HCT 36.0*  MCV 92.5  MCH 31.9  MCHC 34.4  RDW 15.0  PLT 169   Cardiac EnzymesNo results for input(s): TROPONINI in the last 168 hours. No results for input(s): TROPIPOC in the last 168 hours.  BNPNo results  for input(s): BNP, PROBNP in the last 168 hours.  DDimer No results for input(s): DDIMER in the last 168 hours.  Radiology/Studies:  Dg Chest 2 View  Result Date: 06/07/2017 CLINICAL DATA:  Patient with shortness of breath.   Chest pain EXAM: CHEST - 2 VIEW COMPARISON:  Chest radiograph 05/07/2017 FINDINGS: Monitoring leads overlie the patient. Stable cardiomegaly. Tortuosity of the thoracic aorta. Re demonstrated chronic interstitial opacities bilaterally. No superimposed area of pulmonary consolidation. No pleural effusion or pneumothorax. Thoracic spine degenerative changes. IMPRESSION: No acute cardiopulmonary process.  Chronic changes. Electronically Signed   By: Lovey Newcomer M.D.   On: 06/07/2017 18:20    Assessment and Plan:   Unstable angina- Pt admitted with chest pain and dyspnea- r/o progression of CAD. Since original troponin is normal, I will hold off on IV heparin until +. NTG paste. Continue Toprol, ASA, Atorva 80.   CAD- Remote LAD PCI with DES, patent cors and stent March 2016  Orthostatic dizziness Per pt's history, not documented  HTN- On Cardura 8 mg QHS  HLD- On high dose statin  Plan: Admit, add nitrates. Plan cath tomorrow, no heparin unless his troponin turns positive.   Severity of Illness: The appropriate patient status for this patient is OBSERVATION. Observation status is judged to be reasonable and necessary in order to provide the required intensity of service to ensure the patient's safety. The patient's presenting symptoms, physical exam findings, and initial radiographic and laboratory data in the context of their medical condition is felt to place them at decreased risk for further clinical deterioration. Furthermore, it is anticipated that the patient will be medically stable for discharge from the hospital within 2 midnights of admission. The following factors support the patient status of observation.   " The patient's presenting symptoms include chest pain. " The physical exam findings include normal. " The initial radiographic and laboratory data are normal.     For questions or updates, please contact [place] Please consult [email or web address] for contact info under  Cardiology/STEMI.    Signed, Candee Furbish, MD  06/07/2017 6:37 PM

## 2017-06-07 NOTE — Telephone Encounter (Signed)
Agree, we had planned for an outpatient cath for him but will need to be done as inpatient.    Carlyle Dolly MD

## 2017-06-07 NOTE — Telephone Encounter (Signed)
Pt c/o chest tightness/pain since this morning with increased SOB with BP 180s/80s HR 106 - pt has already taken NTG with no relief - advised pt to report to [facility] or [facility] for further evaluation - pt wife was also on the phone and says she would drive him - routed to provider FYI

## 2017-06-07 NOTE — ED Provider Notes (Signed)
[place] EMERGENCY DEPARTMENT Provider Note   CSN: [id number] Arrival date & time: 06/07/17  1558     History   Chief Complaint Chief Complaint  Patient presents with  . Chest Pain    HPI Eric Benitez is a 77 y.o. male.  HPI  77 year old male with a history of coronary disease and a stent presents with chest tightness.  He is been having chest tightness this past week, mostly when doing activities.  However today he is been having chest tightness at rest starting around noon.  Took one nitroglycerin which partially relieved the pain.  He has chronic shortness of breath but it is worse with this tightness.  He also broke out in a sweat but has not had vomiting.  Pain when he first arrived was about a 7/10, currently a 5/10.  He has a headache since taking the nitroglycerin.  He is due for a heart cath in a few days due to an abnormal stress test obtained earlier this week. Cardiologist is Dr. Harl Bowie.  Past Medical History:  Diagnosis Date  . Arthritis    "neck, hands, fingers" (04/09/2014)  . Bleeds easily ([place])   . CAD (coronary artery disease)   . Hyperlipemia   . Hypertension   . Kidney stones    "I've had several; had to go in and get them once" (04/09/2014)  . Pneumonia 1940's    Patient Active Problem List   Diagnosis Date Noted  . GERD (gastroesophageal reflux disease) 12/26/2016  . Constipation 12/26/2016  . Heme + stool 12/26/2016  . Unstable angina ([place]) 04/09/2014  . Essential hypertension 04/09/2014  . Hyperlipidemia with target LDL less than 70 04/09/2014  . Chest pain with low risk of acute coronary syndrome 04/09/2014  . Pain in the chest   . CAD S/P percutaneous coronary angioplasty - PCI to LAD in 2006 04/27/2004    Past Surgical History:  Procedure Laterality Date  . ANKLE FRACTURE SURGERY Right 2009  . CARDIAC CATHETERIZATION  1990's X 2;  04/09/2014  . COLONOSCOPY N/A 01/22/2017   Procedure: COLONOSCOPY;  Surgeon: Danie Binder, MD;  Location: [facility];  Service: Endoscopy;  Laterality: N/A;  12:30pm  . CORONARY ANGIOPLASTY WITH STENT PLACEMENT  2006   "2"  . CYSTOSCOPY W/ STONE MANIPULATION  ~ 2008  . ESOPHAGOGASTRODUODENOSCOPY N/A 01/22/2017   Procedure: ESOPHAGOGASTRODUODENOSCOPY (EGD);  Surgeon: Danie Binder, MD;  Location: [facility];  Service: Endoscopy;  Laterality: N/A;  . FRACTURE SURGERY    . LEFT HEART CATHETERIZATION WITH CORONARY ANGIOGRAM N/A 04/09/2014   Procedure: LEFT HEART CATHETERIZATION WITH CORONARY ANGIOGRAM;  Surgeon: Leonie Man, MD;  Location: [facility] CATH LAB;  Service: Cardiovascular;  Laterality: N/A;  . NASAL SEPTUM SURGERY  1999  . POLYPECTOMY  01/22/2017   Procedure: POLYPECTOMY;  Surgeon: Danie Binder, MD;  Location: [facility];  Service: Endoscopy;;  right colon x4        Home Medications    Prior to Admission medications   Medication Sig Start Date End Date Taking? Authorizing Provider  acetaminophen (TYLENOL) 500 MG tablet Take 1,000 mg by mouth every 6 (six) hours as needed for moderate pain or headache.    [provider]  albuterol (PROVENTIL HFA;VENTOLIN HFA) 108 (90 Base) MCG/ACT inhaler Inhale 1 puff into the lungs as needed for wheezing or shortness of breath. Patient taking differently: Inhale 1-2 puffs into the lungs every 6 (six) hours as needed for wheezing  or shortness of breath.  11/03/15   Arnoldo Lenis, MD  aspirin EC 81 MG tablet Take 81 mg by mouth daily.    [provider]  atorvastatin (LIPITOR) 80 MG tablet Take 80 mg by mouth at bedtime.     [provider]  Cholecalciferol (VITAMIN D3 PO) Take 1 capsule by mouth daily.    [provider]  Cyanocobalamin (VITAMIN B-12 PO) Take 1 tablet by mouth daily.    [provider]  diphenhydramine-acetaminophen (TYLENOL PM) 25-500 MG TABS tablet Take 2 tablets by mouth at bedtime.    [provider]  doxazosin (CARDURA) 8 MG tablet Take 8 mg by  mouth at bedtime.     [provider]  Hypromellose (ARTIFICIAL TEARS OP) Place 1-2 drops into both eyes daily as needed (for dry eyes).    [provider]  loratadine (CLARITIN) 10 MG tablet Take 10 mg by mouth daily.    [provider]  meclizine (ANTIVERT) 25 MG tablet Take 25 mg by mouth daily.    [provider]  metoprolol succinate (TOPROL-XL) 25 MG 24 hr tablet Take 12.5 mg by mouth daily.     [provider]  Multiple Vitamin (MULTIVITAMIN) tablet Take 1 tablet by mouth daily.    [provider]  nitroGLYCERIN (NITROSTAT) 0.4 MG SL tablet Place 1 tablet (0.4 mg total) under the tongue every 5 (five) minutes as needed for chest pain. 05/08/17   Arnoldo Lenis, MD  pantoprazole (PROTONIX) 40 MG tablet TAKE 1 TABLET BY MOUTH DAILY Patient taking differently: Take 40 mg by mouth daily 11/10/15   Arnoldo Lenis, MD  ranolazine (RANEXA) 500 MG 12 hr tablet Take 1 tablet (500 mg total) by mouth 2 (two) times daily. 06/06/17   Arnoldo Lenis, MD  senna (SENOKOT) 8.6 MG tablet Take 2 tablets by mouth at bedtime.     [provider]    Family History Family History  Problem Relation Age of Onset  . Heart disease Mother   . Colon cancer Neg Hx   . Gastric cancer Neg Hx   . Esophageal cancer Neg Hx     Social History Social History   Tobacco Use  . Smoking status: Former Smoker    Packs/day: 1.50    Years: 22.00    Pack years: 33.00    Types: Cigarettes    Start date: 11/28/1942    Last attempt to quit: 02/07/1964    Years since quitting: 53.3  . Smokeless tobacco: Never Used  Substance Use Topics  . Alcohol use: No    Alcohol/week: 0.0 oz    Frequency: Never  . Drug use: No     Allergies   Vancomycin   Review of Systems Review of Systems  Constitutional: Positive for diaphoresis.  Respiratory: Positive for chest tightness and shortness of breath.   Gastrointestinal: Negative for vomiting.  All other  systems reviewed and are negative.    Physical Exam Updated Vital Signs BP 133/62 (BP Location: Right Arm)   Pulse 100   Temp 98.5 F (36.9 C) (Oral)   Resp 20   SpO2 100%   Physical Exam  Constitutional: He is oriented to person, place, and time. He appears well-developed and well-nourished.  Non-toxic appearance. He does not appear ill. No distress.  HENT:  Head: Normocephalic and atraumatic.  Right Ear: External ear normal.  Left Ear: External ear normal.  Nose: Nose normal.  Eyes: Right eye exhibits no discharge.  Left eye exhibits no discharge.  Neck: Neck supple.  Cardiovascular: Normal rate, regular rhythm and normal heart sounds.  Pulses:      Radial pulses are 2+ on the right side, and 2+ on the left side.  Pulmonary/Chest: Effort normal and breath sounds normal. He exhibits no tenderness.  Abdominal: Soft. There is no tenderness.  Musculoskeletal: He exhibits no edema.  Neurological: He is alert and oriented to person, place, and time.  Skin: Skin is warm and dry.  Nursing note and vitals reviewed.    ED Treatments / Results  Labs (all labs ordered are listed, but only abnormal results are displayed) Labs Reviewed  BASIC METABOLIC PANEL - Abnormal; Notable for the following components:      Result Value   Glucose, Bld 198 (*)    Calcium 8.8 (*)    All other components within normal limits  CBC - Abnormal; Notable for the following components:   RBC 3.89 (*)    Hemoglobin 12.4 (*)    HCT 36.0 (*)    All other components within normal limits  TROPONIN I  TROPONIN I  TROPONIN I  I-STAT TROPONIN, ED    EKG EKG Interpretation  Date/Time:  Thursday Jun 07 2017 16:02:04 EDT Ventricular Rate:  103 PR Interval:  202 QRS Duration: 78 QT Interval:  332 QTC Calculation: 434 R Axis:   75 Text Interpretation:  Sinus tachycardia no acute ST/T changes rate is faster compared to April 2019 Confirmed by Sherwood Gambler [phone]) on 06/07/2017 4:07:45 PM   EKG  Interpretation  Date/Time:  Thursday Jun 07 2017 18:27:45 EDT Ventricular Rate:  78 PR Interval:  202 QRS Duration: 76 QT Interval:  369 QTC Calculation: 421 R Axis:   43 Text Interpretation:  Sinus rhythm no acute ST/T changes tachycardia resolved compared to earlier in the day Confirmed by Sherwood Gambler [phone]) on 06/07/2017 8:05:11 PM        Radiology Dg Chest 2 View  Result Date: 06/07/2017 CLINICAL DATA:  Patient with shortness of breath.  Chest pain EXAM: CHEST - 2 VIEW COMPARISON:  Chest radiograph 05/07/2017 FINDINGS: Monitoring leads overlie the patient. Stable cardiomegaly. Tortuosity of the thoracic aorta. Re demonstrated chronic interstitial opacities bilaterally. No superimposed area of pulmonary consolidation. No pleural effusion or pneumothorax. Thoracic spine degenerative changes. IMPRESSION: No acute cardiopulmonary process.  Chronic changes. Electronically Signed   By: Lovey Newcomer M.D.   On: 06/07/2017 18:20    Procedures Procedures (including critical care time)  Medications Ordered in ED Medications  aspirin chewable tablet 324 mg (has no administration in time range)  nitroGLYCERIN (NITROSTAT) SL tablet 0.4 mg (has no administration in time range)  acetaminophen (TYLENOL) tablet 1,000 mg (has no administration in time range)     Initial Impression / Assessment and Plan / ED Course  I have reviewed the triage vital signs and the nursing notes.  Pertinent labs & imaging results that were available during my care of the patient were reviewed by me and considered in my medical decision making (see chart for details).     Patient's chest pain has improved with nitro but still present in about a 2 out of 10.  Initial troponin negative.  ECG nondiagnostic.  No obvious ST elevations or depressions.  My concern is for unstable angina given this is the first time it started at rest and consistent with anginal pain.  Consulted cardiology who recommends Nitropaste and no  IV heparin until his troponins leak.  However they  will admit and likely cath tomorrow instead of waiting until after the weekend.  Final Clinical Impressions(s) / ED Diagnoses   Final diagnoses:  Unstable angina [facility])    ED Discharge Orders    None       Sherwood Gambler, MD 06/07/17 2352

## 2017-06-07 NOTE — ED Triage Notes (Signed)
Pt in c/o increased chest pain this afternoon, pt has known blockage and is supposed to have a catheterization and stent placed on Monday, states the pain has been intermittent for weeks but today became unbearable at home with increased shortness of breath- pain improved with nitro

## 2017-06-08 ENCOUNTER — Encounter (HOSPITAL_COMMUNITY): Payer: Self-pay | Admitting: Internal Medicine

## 2017-06-08 ENCOUNTER — Ambulatory Visit (HOSPITAL_COMMUNITY)
Admission: EM | Disposition: A | Discharge status: Home / Self Care | Payer: Self-pay | Source: Home / Self Care | Attending: Emergency Medicine

## 2017-06-08 ENCOUNTER — Other Ambulatory Visit: Payer: Self-pay

## 2017-06-08 DIAGNOSIS — Z8249 Family history of ischemic heart disease and other diseases of the circulatory system: Secondary | ICD-10-CM | POA: Diagnosis not present

## 2017-06-08 DIAGNOSIS — I10 Essential (primary) hypertension: Secondary | ICD-10-CM | POA: Diagnosis not present

## 2017-06-08 DIAGNOSIS — Z955 Presence of coronary angioplasty implant and graft: Secondary | ICD-10-CM | POA: Diagnosis not present

## 2017-06-08 DIAGNOSIS — Z87891 Personal history of nicotine dependence: Secondary | ICD-10-CM | POA: Diagnosis not present

## 2017-06-08 DIAGNOSIS — Z881 Allergy status to other antibiotic agents status: Secondary | ICD-10-CM | POA: Diagnosis not present

## 2017-06-08 DIAGNOSIS — Z7982 Long term (current) use of aspirin: Secondary | ICD-10-CM | POA: Diagnosis not present

## 2017-06-08 DIAGNOSIS — E785 Hyperlipidemia, unspecified: Secondary | ICD-10-CM | POA: Diagnosis not present

## 2017-06-08 DIAGNOSIS — Z79899 Other long term (current) drug therapy: Secondary | ICD-10-CM | POA: Diagnosis not present

## 2017-06-08 DIAGNOSIS — I25110 Atherosclerotic heart disease of native coronary artery with unstable angina pectoris: Secondary | ICD-10-CM | POA: Diagnosis not present

## 2017-06-08 HISTORY — PX: LEFT HEART CATH AND CORONARY ANGIOGRAPHY: CATH118249

## 2017-06-08 HISTORY — PX: INTRAVASCULAR PRESSURE WIRE/FFR STUDY: CATH118243

## 2017-06-08 LAB — TROPONIN I
Troponin I: <0.03 ng/mL (ref <0.03)
Troponin I: <0.03 ng/mL (ref <0.03)

## 2017-06-08 LAB — POCT ACTIVATED CLOTTING TIME: ACTIVATED CLOTTING TIME: 208 s

## 2017-06-08 LAB — POCT I-STAT TROPONIN I: TROPONIN I, POC: 0 ng/mL (ref 0.00–0.08)

## 2017-06-08 SURGERY — LEFT HEART CATH AND CORONARY ANGIOGRAPHY
Anesthesia: LOCAL

## 2017-06-08 MED ORDER — SODIUM CHLORIDE 0.9% FLUSH: 3.0000 mL | Freq: Two times a day (BID) | INTRAVENOUS | Status: DC

## 2017-06-08 MED ORDER — HEPARIN (PORCINE) IN NACL 1000-0.9 UT/500ML-% IV SOLN
INTRAVENOUS | Status: AC
  Filled 2017-06-08: qty 1000

## 2017-06-08 MED ORDER — SODIUM CHLORIDE 0.9 % WEIGHT BASED INFUSION
1.0000 mL/kg/h | INTRAVENOUS | Status: DC
  Administered 2017-06-08: 1 mL/kg/h via INTRAVENOUS
  Administered 2017-06-08 (×2): 250 mL via INTRAVENOUS

## 2017-06-08 MED ORDER — SODIUM CHLORIDE 0.9 % IV SOLN: 250.0000 mL | INTRAVENOUS | Status: DC | PRN

## 2017-06-08 MED ORDER — LIDOCAINE HCL (PF) 1 % IJ SOLN
INTRAMUSCULAR | Status: DC | PRN
  Administered 2017-06-08: 2 mL

## 2017-06-08 MED ORDER — IOHEXOL 350 MG/ML SOLN
INTRAVENOUS | Status: DC | PRN
  Administered 2017-06-08: 75 mL via INTRA_ARTERIAL

## 2017-06-08 MED ORDER — ASPIRIN 81 MG PO CHEW: 81.0000 mg | CHEWABLE_TABLET | ORAL | Status: AC

## 2017-06-08 MED ORDER — VERAPAMIL HCL 2.5 MG/ML IV SOLN
INTRAVENOUS | Status: DC | PRN
  Administered 2017-06-08: 10 mL via INTRA_ARTERIAL

## 2017-06-08 MED ORDER — ADENOSINE 12 MG/4ML IV SOLN
INTRAVENOUS | Status: AC
  Filled 2017-06-08: qty 16

## 2017-06-08 MED ORDER — ENOXAPARIN SODIUM 40 MG/0.4ML ~~LOC~~ SOLN: 40.0000 mg | SUBCUTANEOUS | Status: DC

## 2017-06-08 MED ORDER — HEPARIN (PORCINE) IN NACL 2-0.9 UNITS/ML
INTRAMUSCULAR | Status: AC | PRN
  Administered 2017-06-08 (×2): 500 mL

## 2017-06-08 MED ORDER — HEPARIN SODIUM (PORCINE) 1000 UNIT/ML IJ SOLN
INTRAMUSCULAR | Status: AC
  Filled 2017-06-08: qty 1

## 2017-06-08 MED ORDER — NITROGLYCERIN 1 MG/10 ML FOR IR/CATH LAB
INTRA_ARTERIAL | Status: DC | PRN
  Administered 2017-06-08 (×2): 200 ug via INTRACORONARY

## 2017-06-08 MED ORDER — MIDAZOLAM HCL 2 MG/2ML IJ SOLN
INTRAMUSCULAR | Status: DC | PRN
  Administered 2017-06-08: 1 mg via INTRAVENOUS

## 2017-06-08 MED ORDER — NITROGLYCERIN 1 MG/10 ML FOR IR/CATH LAB
INTRA_ARTERIAL | Status: AC
  Filled 2017-06-08: qty 10

## 2017-06-08 MED ORDER — MIDAZOLAM HCL 2 MG/2ML IJ SOLN
INTRAMUSCULAR | Status: AC
  Filled 2017-06-08: qty 2

## 2017-06-08 MED ORDER — HEPARIN SODIUM (PORCINE) 1000 UNIT/ML IJ SOLN
INTRAMUSCULAR | Status: DC | PRN
  Administered 2017-06-08: 2000 [IU] via INTRAVENOUS
  Administered 2017-06-08: 4000 [IU] via INTRAVENOUS
  Administered 2017-06-08: 3000 [IU] via INTRAVENOUS

## 2017-06-08 MED ORDER — ADENOSINE (DIAGNOSTIC) 140MCG/KG/MIN
INTRAVENOUS | Status: DC | PRN
  Administered 2017-06-08: 140 ug/kg/min via INTRAVENOUS

## 2017-06-08 MED ORDER — LABETALOL HCL 5 MG/ML IV SOLN: 10.0000 mg | INTRAVENOUS | Status: AC | PRN

## 2017-06-08 MED ORDER — FENTANYL CITRATE (PF) 100 MCG/2ML IJ SOLN
INTRAMUSCULAR | Status: AC
  Filled 2017-06-08: qty 2

## 2017-06-08 MED ORDER — SODIUM CHLORIDE 0.9 % WEIGHT BASED INFUSION: 3.0000 mL/kg/h | INTRAVENOUS | Status: DC

## 2017-06-08 MED ORDER — FENTANYL CITRATE (PF) 100 MCG/2ML IJ SOLN
INTRAMUSCULAR | Status: DC | PRN
  Administered 2017-06-08: 25 ug via INTRAVENOUS

## 2017-06-08 MED ORDER — LIDOCAINE HCL (PF) 1 % IJ SOLN
INTRAMUSCULAR | Status: AC
  Filled 2017-06-08: qty 30

## 2017-06-08 MED ORDER — SODIUM CHLORIDE 0.9 % WEIGHT BASED INFUSION: 1.0000 mL/kg/h | INTRAVENOUS | Status: DC

## 2017-06-08 MED ORDER — VERAPAMIL HCL 2.5 MG/ML IV SOLN
INTRAVENOUS | Status: AC
  Filled 2017-06-08: qty 2

## 2017-06-08 MED ORDER — SODIUM CHLORIDE 0.9 % IV SOLN: INTRAVENOUS | Status: AC

## 2017-06-08 MED ORDER — SODIUM CHLORIDE 0.9% FLUSH: 3.0000 mL | INTRAVENOUS | Status: DC | PRN

## 2017-06-08 MED ORDER — HYDRALAZINE HCL 20 MG/ML IJ SOLN: 5.0000 mg | INTRAMUSCULAR | Status: AC | PRN

## 2017-06-08 SURGICAL SUPPLY — 14 items
CATH INFINITI 5FR ANG PIGTAIL (CATHETERS) ×2 IMPLANT
CATH OPTITORQUE TIG 4.0 5F (CATHETERS) ×2 IMPLANT
CATH VISTA GUIDE 6FR XBLAD3.5 (CATHETERS) ×2 IMPLANT
DEVICE RAD COMP TR BAND LRG (VASCULAR PRODUCTS) ×2 IMPLANT
GLIDESHEATH SLEND A-KIT 6F 22G (SHEATH) ×2 IMPLANT
GUIDEWIRE INQWIRE 1.5J.035X260 (WIRE) ×1 IMPLANT
GUIDEWIRE PRESSURE COMET II (WIRE) ×2 IMPLANT
INQWIRE 1.5J .035X260CM (WIRE) ×2
KIT ESSENTIALS PG (KITS) ×2 IMPLANT
KIT HEART LEFT (KITS) ×2 IMPLANT
PACK CARDIAC CATHETERIZATION (CUSTOM PROCEDURE TRAY) ×2 IMPLANT
SYR MEDRAD MARK V 150ML (SYRINGE) ×2 IMPLANT
TRANSDUCER W/STOPCOCK (MISCELLANEOUS) ×2 IMPLANT
TUBING CIL FLEX 10 FLL-RA (TUBING) ×2 IMPLANT

## 2017-06-08 NOTE — Research (Signed)
CADFEM Informed Consent   Subject Name: Eric Benitez  Subject met inclusion and exclusion criteria.  The informed consent form, study requirements and expectations were reviewed with the subject and questions and concerns were addressed prior to the signing of the consent form.  The subject verbalized understanding of the trail requirements.  The subject agreed to participate in the CADFEM trial and signed the informed consent.  The informed consent was obtained prior to performance of any protocol-specific procedures for the subject.  A copy of the signed informed consent was given to the subject and a copy was placed in the subject's medical record.  Christena Flake 06/08/2017, 08:00 AM

## 2017-06-08 NOTE — Interval H&P Note (Signed)
History and Physical Interval Note:  06/08/2017 10:03 AM  Eric Benitez  has presented today for cardiac catheterization, with the diagnosis of unstable angina. The various methods of treatment have been discussed with the patient and family. After consideration of risks, benefits and other options for treatment, the patient has consented to  Procedure(s): LEFT HEART CATH AND CORONARY ANGIOGRAPHY (N/A) as a surgical intervention .  The patient's history has been reviewed, patient examined, no change in status, stable for surgery.  I have reviewed the patient's chart and labs.  Questions were answered to the patient's satisfaction.    Cath Lab Visit (complete for each Cath Lab visit)  Clinical Evaluation Leading to the Procedure:   ACS: Yes.    Non-ACS:  N/A  Eric Benitez

## 2017-06-08 NOTE — Discharge Summary (Signed)
Discharge Summary    Patient ID: Eric Benitez,  MRN: [id number], DOB/AGE: 1940/12/31 77 y.o.  Admit date: 06/07/2017 Discharge date: 06/08/2017  Primary Care Provider: Caryl Bis Primary Cardiologist: Dr. Harl Bowie   Discharge Diagnoses    Active Problems:   Unstable angina [facility])  Allergies Allergies  Allergen Reactions  . Vancomycin Rash    Diagnostic Studies/Procedures    Cath: 06/08/17  Conclusion   Conclusions: 1. Stable appearance of the coronary arteries since 2016, with 60% ostial D1 stenosis (FFR 0.90), patent mid LAD stent, and mild to moderate diffuse RCA disease. 2. Normal to hyperdynamic left ventricular contraction with low filling pressure (LVEDP 6 mmHg).  Recommendations: 1. Continue medical therapy and aggressive secondary prevention.  Nelva Bush, MD   _____________   History of Present Illness     77 y/o male with a history of remote CAD. He had a PCI 10 years ago, LAD DES. Cath in March 2016 showed a patent LAD stent and a 40-50% Dx lesion. EF 55%. He saw Dr Harl Bowie 05/15/17 and complained of DOE and chest tightness (walking to his mailbox). An Op Myoview suggested some apical ischemia but was overall read as low risk. The pt continued to complain of symptoms, and in fact complained of increasing symptoms. He saw Dr Harl Bowie in the office 06/06/17 and the plan was to proceed to cath. The pt has also had what sounds like orthostatic symptoms so Dr Harl Bowie had not been able to increase the pt's antianginal Rx. The pt presented to [facility] [place] 06/07/17 with chest tightness associated with sweating. He was admitted for further evaluation.   Hospital Course     He underwent cath with Dr. Saunders Revel noted above with stable appearance of coronaries since last cath in 2016 with osital D1 FFR 0.90. Patent mLAD stent. Normal to hyperdynamic LV function, LVEDP 69mmHg. Plan was to continue with medical therapy. No complications noted post cath. Radial site stable. He is on  chronic PPI therapy prior to admission.   Eric Benitez was seen by Dr. Burt Knack and determined stable for discharge home. Follow up in the office has been arranged. Medications are listed below.   _____________  Discharge Vitals Blood pressure 136/69, pulse 83, temperature 98.1 F (36.7 C), temperature source Oral, resp. rate (!) 22, height 5\' 9"  (1.753 m), weight 163 lb 5.8 oz (74.1 kg), SpO2 99 %.  Filed Weights   06/07/17 2101 06/08/17 0500  Weight: 165 lb 4.8 oz (75 kg) 163 lb 5.8 oz (74.1 kg)    Labs & Radiologic Studies    CBC Recent Labs    06/07/17 1646  WBC 7.2  HGB 12.4*  HCT 36.0*  MCV 92.5  PLT 287   Basic Metabolic Panel Recent Labs    06/07/17 1646  NA 137  K 3.9  CL 106  CO2 25  GLUCOSE 198*  BUN 17  CREATININE 1.12  CALCIUM 8.8*   Liver Function Tests No results for input(s): AST, ALT, ALKPHOS, BILITOT, PROT, ALBUMIN in the last 72 hours. No results for input(s): LIPASE, AMYLASE in the last 72 hours. Cardiac Enzymes Recent Labs    06/07/17 1959 06/08/17 0047 06/08/17 0636  TROPONINI <0.03 <0.03 <0.03   BNP Invalid input(s): POCBNP D-Dimer No results for input(s): DDIMER in the last 72 hours. Hemoglobin A1C No results for input(s): HGBA1C in the last 72 hours. Fasting Lipid Panel No results for input(s): CHOL, HDL, LDLCALC, TRIG, CHOLHDL, LDLDIRECT in the last 72 hours.  Thyroid Function Tests No results for input(s): TSH, T4TOTAL, T3FREE, THYROIDAB in the last 72 hours.  Invalid input(s): FREET3 _____________  Dg Chest 2 View  Result Date: 06/07/2017 CLINICAL DATA:  Patient with shortness of breath.  Chest pain EXAM: CHEST - 2 VIEW COMPARISON:  Chest radiograph 05/07/2017 FINDINGS: Monitoring leads overlie the patient. Stable cardiomegaly. Tortuosity of the thoracic aorta. Re demonstrated chronic interstitial opacities bilaterally. No superimposed area of pulmonary consolidation. No pleural effusion or pneumothorax. Thoracic spine  degenerative changes. IMPRESSION: No acute cardiopulmonary process.  Chronic changes. Electronically Signed   By: Lovey Newcomer M.D.   On: 06/07/2017 18:20   Nm Myocar Multi W/spect W/wall Motion / Ef  Result Date: 05/31/2017  No diagnostic ST segment changes to indicate ischemia.  Small, mild intensity, apical inferior defect that is reversible and consistent with a small ischemic territory. Fixed inferior defect is more consistent with soft tissue attenuation.  This is a low risk study.  Nuclear stress EF: 76%.    Disposition   Pt is being discharged home today in good condition.  Follow-up Plans & Appointments    Follow-up Information    Branch, Alphonse Guild, MD Follow up.   Specialty:  Cardiology Why:  The office will call you with a follow up appt.  Contact information: [address] [place] [address] [phone]          Discharge Instructions    Call MD for:  redness, tenderness, or signs of infection (pain, swelling, redness, odor or green/yellow discharge around incision site)   Complete by:  As directed    Diet - low sodium heart healthy   Complete by:  As directed    Discharge instructions   Complete by:  As directed    Radial Site Care Refer to this sheet in the next few weeks. These instructions provide you with information on caring for yourself after your procedure. Your caregiver may also give you more specific instructions. Your treatment has been planned according to current medical practices, but problems sometimes occur. Call your caregiver if you have any problems or questions after your procedure. HOME CARE INSTRUCTIONS You may shower the day after the procedure.Remove the bandage (dressing) and gently wash the site with plain soap and water.Gently pat the site dry.  Do not apply powder or lotion to the site.  Do not submerge the affected site in water for 3 to 5 days.  Inspect the site at least twice daily.  Do not flex or bend the affected arm  for 24 hours.  No lifting over 5 pounds (2.3 kg) for 5 days after your procedure.  Do not drive home if you are discharged the same day of the procedure. Have someone else drive you.  You may drive 24 hours after the procedure unless otherwise instructed by your caregiver.  What to expect: Any bruising will usually fade within 1 to 2 weeks.  Blood that collects in the tissue (hematoma) may be painful to the touch. It should usually decrease in size and tenderness within 1 to 2 weeks.  SEEK IMMEDIATE MEDICAL CARE IF: You have unusual pain at the radial site.  You have redness, warmth, swelling, or pain at the radial site.  You have drainage (other than a small amount of blood on the dressing).  You have chills.  You have a fever or persistent symptoms for more than 72 hours.  You have a fever and your symptoms suddenly get worse.  Your arm becomes  pale, cool, tingly, or numb.  You have heavy bleeding from the site. Hold pressure on the site.   Increase activity slowly   Complete by:  As directed      Discharge Medications     Medication List    TAKE these medications   acetaminophen 500 MG tablet Commonly known as:  TYLENOL Take 1,000 mg by mouth every 6 (six) hours as needed for moderate pain or headache.   albuterol 108 (90 Base) MCG/ACT inhaler Commonly known as:  PROVENTIL HFA;VENTOLIN HFA Inhale 1 puff into the lungs as needed for wheezing or shortness of breath. What changed:    how much to take  when to take this   ARTIFICIAL TEARS OP Place 1-2 drops into both eyes daily as needed (for dry eyes).   aspirin EC 81 MG tablet Take 81 mg by mouth daily.   atorvastatin 80 MG tablet Commonly known as:  LIPITOR Take 80 mg by mouth at bedtime.   diphenhydramine-acetaminophen 25-500 MG Tabs tablet Commonly known as:  TYLENOL PM Take 2 tablets by mouth at bedtime.   doxazosin 8 MG tablet Commonly known as:  CARDURA Take 8 mg by mouth at bedtime.   loratadine 10 MG  tablet Commonly known as:  CLARITIN Take 10 mg by mouth daily.   meclizine 25 MG tablet Commonly known as:  ANTIVERT Take 25 mg by mouth daily.   metoprolol succinate 25 MG 24 hr tablet Commonly known as:  TOPROL-XL Take 12.5 mg by mouth daily.   multivitamin tablet Take 1 tablet by mouth daily.   nitroGLYCERIN 0.4 MG SL tablet Commonly known as:  NITROSTAT Place 1 tablet (0.4 mg total) under the tongue every 5 (five) minutes as needed for chest pain.   pantoprazole 40 MG tablet Commonly known as:  PROTONIX TAKE 1 TABLET BY MOUTH DAILY What changed:    how much to take  how to take this  when to take this   ranolazine 500 MG 12 hr tablet Commonly known as:  RANEXA Take 1 tablet (500 mg total) by mouth 2 (two) times daily.   senna 8.6 MG tablet Commonly known as:  SENOKOT Take 2 tablets by mouth at bedtime.   VITAMIN B-12 PO Take 1 tablet by mouth daily.   VITAMIN D3 PO Take 1 capsule by mouth at bedtime.         Outstanding Labs/Studies   N/a   Duration of Discharge Encounter   Greater than 30 minutes including physician time.  Signed, Reino Bellis NP-C 06/08/2017, 3:39 PM

## 2017-06-08 NOTE — Progress Notes (Signed)
Progress Note  Patient Name: Eric Benitez Date of Encounter: 06/08/2017  Primary Cardiologist: Carlyle Dolly, MD   Subjective   Still with mild chest discomfort. No other complaints. No dyspnea at rest.   Inpatient Medications    Scheduled Meds: . acetaminophen  1,000 mg Oral QHS   And  . diphenhydrAMINE  50 mg Oral QHS  . aspirin EC  81 mg Oral Daily  . atorvastatin  80 mg Oral QHS  . doxazosin  8 mg Oral QHS  . [START ON 06/09/2017] enoxaparin (LOVENOX) injection  40 mg Subcutaneous Q24H  . loratadine  10 mg Oral Daily  . metoprolol succinate  12.5 mg Oral Daily  . multivitamin with minerals  1 tablet Oral Daily  . pantoprazole  40 mg Oral Daily  . pneumococcal 23 valent vaccine  0.5 mL Intramuscular Tomorrow-1000  . ranolazine  500 mg Oral BID  . senna  2 tablet Oral QHS  . sodium chloride flush  3 mL Intravenous Q12H   Continuous Infusions: . sodium chloride 125 mL/hr at 06/08/17 1125  . sodium chloride     PRN Meds: sodium chloride, acetaminophen, albuterol, hydrALAZINE, labetalol, meclizine, nitroGLYCERIN, ondansetron (ZOFRAN) IV, polyvinyl alcohol, sodium chloride flush   Vital Signs    Vitals:   06/08/17 1039 06/08/17 1041 06/08/17 1044 06/08/17 1049  BP: 124/70 125/78 125/78 123/70  Pulse: 69 66 76 69  Resp: 16 17 (!) 22 19  Temp:      TempSrc:      SpO2: 99% 99% 98% 98%  Weight:      Height:        Intake/Output Summary (Last 24 hours) at 06/08/2017 1407 Last data filed at 06/08/2017 0950 Gross per 24 hour  Intake 221.35 ml  Output 760 ml  Net -538.65 ml   Filed Weights   06/07/17 2101 06/08/17 0500  Weight: 165 lb 4.8 oz (75 kg) 163 lb 5.8 oz (74.1 kg)      Physical Exam  Alert, oriented, in NAD GEN: No acute distress.   Neck: No JVD Cardiac: RRR, no murmurs, rubs, or gallops.  Respiratory: Clear to auscultation bilaterally. GI: Soft, nontender, non-distended  MS: No edema; No deformity. TR band in place right wrist Neuro:   Nonfocal  Psych: Normal affect   Labs    Chemistry Recent Labs  Lab 06/07/17 1646  NA 137  K 3.9  CL 106  CO2 25  GLUCOSE 198*  BUN 17  CREATININE 1.12  CALCIUM 8.8*  GFRNONAA >60  GFRAA >60  ANIONGAP 6     Hematology Recent Labs  Lab 06/07/17 1646  WBC 7.2  RBC 3.89*  HGB 12.4*  HCT 36.0*  MCV 92.5  MCH 31.9  MCHC 34.4  RDW 15.0  PLT 169    Cardiac Enzymes Recent Labs  Lab 06/07/17 1959 06/08/17 0047 06/08/17 0636  TROPONINI <0.03 <0.03 <0.03    Recent Labs  Lab 06/07/17 1655  TROPIPOC 0.00     BNPNo results for input(s): BNP, PROBNP in the last 168 hours.   DDimer No results for input(s): DDIMER in the last 168 hours.   Radiology    Dg Chest 2 View  Result Date: 06/07/2017 CLINICAL DATA:  Patient with shortness of breath.  Chest pain EXAM: CHEST - 2 VIEW COMPARISON:  Chest radiograph 05/07/2017 FINDINGS: Monitoring leads overlie the patient. Stable cardiomegaly. Tortuosity of the thoracic aorta. Re demonstrated chronic interstitial opacities bilaterally. No superimposed area of pulmonary consolidation. No pleural effusion  or pneumothorax. Thoracic spine degenerative changes. IMPRESSION: No acute cardiopulmonary process.  Chronic changes. Electronically Signed   By: Lovey Newcomer M.D.   On: 06/07/2017 18:20    Cardiac Studies   Cardiac Cath: Conclusion   Conclusions: 1. Stable appearance of the coronary arteries since 2016, with 60% ostial D1 stenosis (FFR 0.90), patent mid LAD stent, and mild to moderate diffuse RCA disease. 2. Normal to hyperdynamic left ventricular contraction with low filling pressure (LVEDP 6 mmHg).  Recommendations: 1. Continue medical therapy and aggressive secondary prevention.   Echo 05-16-2017: Study Conclusions  - Left ventricle: The cavity size was normal. Wall thickness was   increased in a pattern of mild LVH. Systolic function was normal.   The estimated ejection fraction was in the range of 60% to 65%.    Wall motion was normal; there were no regional wall motion   abnormalities. - Aortic valve: Mildly calcified annulus. Trileaflet. - Mitral valve: Mildly thickened leaflets . There was trivial   regurgitation. - Right atrium: Central venous pressure (est): 3 mm Hg. - Atrial septum: No defect or patent foramen ovale was identified. - Tricuspid valve: There was trivial regurgitation. - Pulmonary arteries: Systolic pressure could not be accurately   estimated. - Pericardium, extracardiac: There was no pericardial effusion.   Patient Profile     77 y.o. male with known CAD and previous LAD stenting who presented with chest pain concerning for unstable angina pectoris, referred for cardiac catheterization  Assessment & Plan    Chest pain at rest: No change in coronary anatomy based on cardiac catheterization performed earlier today.  FFR of a diagonal branches performed and showed no evidence of coronary flow restriction.  He should be managed on his current medical program.  I do not think the patient's coronary artery disease explains his resting chest pain.  We discussed alternative etiologies of chest discomfort including esophageal and chest wall pathology.  Appears stable for discharge. Pt on chronic PPI therapy already. FU Dr Harl Bowie.   For questions or updates, please contact [facility] Please consult [email or web address] for contact info under Cardiology/STEMI.      Signed, Sherren Mocha, MD  06/08/2017, 2:07 PM

## 2017-06-11 ENCOUNTER — Ambulatory Visit (HOSPITAL_COMMUNITY): Admission: RE | Admit: 2017-06-11 | Payer: PPO | Source: Ambulatory Visit | Admitting: Cardiology

## 2017-06-11 ENCOUNTER — Encounter (HOSPITAL_COMMUNITY): Admission: RE | Payer: Self-pay | Source: Ambulatory Visit

## 2017-06-11 SURGERY — RIGHT/LEFT HEART CATH AND CORONARY ANGIOGRAPHY
Anesthesia: LOCAL

## 2017-06-12 MED FILL — Heparin Sod (Porcine)-NaCl IV Soln 1000 Unit/500ML-0.9%: INTRAVENOUS | Qty: 1000 | Status: AC

## 2017-06-29 ENCOUNTER — Other Ambulatory Visit: Payer: Self-pay | Admitting: Cardiology

## 2017-07-16 ENCOUNTER — Encounter: Payer: Self-pay | Admitting: Cardiology

## 2017-07-16 ENCOUNTER — Ambulatory Visit: Payer: PPO | Admitting: Cardiology

## 2017-07-16 ENCOUNTER — Other Ambulatory Visit: Payer: Self-pay

## 2017-07-16 VITALS — BP 152/78 | HR 73 | Ht 69.0 in | Wt 168.0 lb

## 2017-07-16 DIAGNOSIS — R0789 Other chest pain: Secondary | ICD-10-CM | POA: Diagnosis not present

## 2017-07-16 DIAGNOSIS — I2510 Atherosclerotic heart disease of native coronary artery without angina pectoris: Secondary | ICD-10-CM

## 2017-07-16 DIAGNOSIS — R0602 Shortness of breath: Secondary | ICD-10-CM

## 2017-07-16 NOTE — Patient Instructions (Signed)
Your physician wants you to follow-up in: [place] will receive a reminder letter in the mail two months in advance. If you don't receive a letter, please call our office to schedule the follow-up appointment.  Your physician has recommended you make the following change in your medication:   STOP RANEXA  Thank you for choosing [facility]!!

## 2017-07-16 NOTE — Progress Notes (Signed)
Clinical Summary Eric Benitez is a 77 y.o.male seen today for follow up of the following medical problems.    1. CAD - hx of prior stenting 10 years ago - cath 04/09/14 with patent coronaries.  - compliant with meds  - recent chest pain and SOB symptoms, referred for cath.  06/2017 cath patent vessels. LVEDP 6.  - no recent symptoms. No recent chest pain, breathing has improved - he thinks may have been associated with allergies   2. SOB - 10/2015 PFTs mild ventilatory defect - 05/2017 echo LVEF 60-65%, no WMAs, diastolic function not described.  - 05/2017 lexiscan small apical ischemia, low risk - 06/2017 cath patent vessels. LVEDP 6.   Symptoms resolved since last visit.    3. Dizzy spells - resolved since last visit.      Past Medical History:  Diagnosis Date  . Arthritis    "neck, hands, fingers" (04/09/2014)  . Bleeds easily ([place])   . CAD (coronary artery disease)   . Hyperlipemia   . Hypertension   . Kidney stones    "I've had several; had to go in and get them once" (04/09/2014)  . Pneumonia 1940's     Allergies  Allergen Reactions  . Vancomycin Rash     Current Outpatient Medications  Medication Sig Dispense Refill  . acetaminophen (TYLENOL) 500 MG tablet Take 1,000 mg by mouth every 6 (six) hours as needed for moderate pain or headache.    . albuterol (PROVENTIL HFA;VENTOLIN HFA) 108 (90 Base) MCG/ACT inhaler Inhale 1 puff into the lungs as needed for wheezing or shortness of breath. (Patient taking differently: Inhale 1-2 puffs into the lungs every 6 (six) hours as needed for wheezing or shortness of breath. ) 1 Inhaler 2  . aspirin EC 81 MG tablet Take 81 mg by mouth daily.    Marland Kitchen atorvastatin (LIPITOR) 80 MG tablet Take 80 mg by mouth at bedtime.     . Cholecalciferol (VITAMIN D3 PO) Take 1 capsule by mouth at bedtime.     . Cyanocobalamin (VITAMIN B-12 PO) Take 1 tablet by mouth daily.    . diphenhydramine-acetaminophen (TYLENOL PM) 25-500 MG  TABS tablet Take 2 tablets by mouth at bedtime.    Marland Kitchen doxazosin (CARDURA) 8 MG tablet Take 8 mg by mouth at bedtime.     . Hypromellose (ARTIFICIAL TEARS OP) Place 1-2 drops into both eyes daily as needed (for dry eyes).    Marland Kitchen loratadine (CLARITIN) 10 MG tablet Take 10 mg by mouth daily.    . meclizine (ANTIVERT) 25 MG tablet Take 25 mg by mouth daily.    . metoprolol succinate (TOPROL-XL) 25 MG 24 hr tablet Take 12.5 mg by mouth daily.     . Multiple Vitamin (MULTIVITAMIN) tablet Take 1 tablet by mouth daily.    . nitroGLYCERIN (NITROSTAT) 0.4 MG SL tablet PLACE 1 TABLET (0.4 MG TOTAL) UNDER THE TONGUE EVERY 5 (FIVE) MINUTES AS NEEDED FOR CHEST PAIN. 25 tablet 3  . pantoprazole (PROTONIX) 40 MG tablet TAKE 1 TABLET BY MOUTH DAILY (Patient taking differently: Take 40 mg by mouth daily) 30 tablet 6  . ranolazine (RANEXA) 500 MG 12 hr tablet Take 1 tablet (500 mg total) by mouth 2 (two) times daily. 60 tablet 3  . senna (SENOKOT) 8.6 MG tablet Take 2 tablets by mouth at bedtime.      No current facility-administered medications for this visit.      Past Surgical History:  Procedure Laterality Date  .  ANKLE FRACTURE SURGERY Right 2009  . CARDIAC CATHETERIZATION  1990's X 2;  04/09/2014  . COLONOSCOPY N/A 01/22/2017   Procedure: COLONOSCOPY;  Surgeon: Danie Binder, MD;  Location: [facility];  Service: Endoscopy;  Laterality: N/A;  12:30pm  . CORONARY ANGIOPLASTY WITH STENT PLACEMENT  2006   "2"  . CYSTOSCOPY W/ STONE MANIPULATION  ~ 2008  . ESOPHAGOGASTRODUODENOSCOPY N/A 01/22/2017   Procedure: ESOPHAGOGASTRODUODENOSCOPY (EGD);  Surgeon: Danie Binder, MD;  Location: [facility];  Service: Endoscopy;  Laterality: N/A;  . FRACTURE SURGERY    . INTRAVASCULAR PRESSURE WIRE/FFR STUDY N/A 06/08/2017   Procedure: INTRAVASCULAR PRESSURE WIRE/FFR STUDY;  Surgeon: Nelva Bush, MD;  Location: [facility];  Service: Cardiovascular;  Laterality: N/A;  . LEFT HEART CATH AND CORONARY  ANGIOGRAPHY N/A 06/08/2017   Procedure: LEFT HEART CATH AND CORONARY ANGIOGRAPHY;  Surgeon: Nelva Bush, MD;  Location: [facility];  Service: Cardiovascular;  Laterality: N/A;  . LEFT HEART CATHETERIZATION WITH CORONARY ANGIOGRAM N/A 04/09/2014   Procedure: LEFT HEART CATHETERIZATION WITH CORONARY ANGIOGRAM;  Surgeon: Leonie Man, MD;  Location[facility];  Service: Cardiovascular;  Laterality: N/A;  . NASAL SEPTUM SURGERY  1999  . POLYPECTOMY  01/22/2017   Procedure: POLYPECTOMY;  Surgeon: Danie Binder, MD;  Location: [facility];  Service: Endoscopy;;  right colon x4     Allergies  Allergen Reactions  . Vancomycin Rash      Family History  Problem Relation Age of Onset  . Heart disease Mother   . Colon cancer Neg Hx   . Gastric cancer Neg Hx   . Esophageal cancer Neg Hx      Social History Eric Benitez reports that he quit smoking about 53 years ago. His smoking use included cigarettes. He started smoking about 74 years ago. He has a 33.00 pack-year smoking history. He has never used smokeless tobacco. Eric Benitez reports that he does not drink alcohol.   Review of Systems CONSTITUTIONAL: No weight loss, fever, chills, weakness or fatigue.  HEENT: Eyes: No visual loss, blurred vision, double vision or yellow sclerae.No hearing loss, sneezing, congestion, runny nose or sore throat.  SKIN: No rash or itching.  CARDIOVASCULAR: per hpi RESPIRATORY: No shortness of breath, cough or sputum.  GASTROINTESTINAL: No anorexia, nausea, vomiting or diarrhea. No abdominal pain or blood.  GENITOURINARY: No burning on urination, no polyuria NEUROLOGICAL: No headache, dizziness, syncope, paralysis, ataxia, numbness or tingling in the extremities. No change in bowel or bladder control.  MUSCULOSKELETAL: No muscle, back pain, joint pain or stiffness.  LYMPHATICS: No enlarged nodes. No history of splenectomy.  PSYCHIATRIC: No history of depression or anxiety.  ENDOCRINOLOGIC:  No reports of sweating, cold or heat intolerance. No polyuria or polydipsia.  Marland Kitchen   Physical Examination Vitals:   07/16/17 1525  BP: (!) 152/78  Pulse: 73  SpO2: 99%   Vitals:   07/16/17 1525  Weight: 168 lb (76.2 kg)  Height: 5\' 9"  (1.753 m)    Gen: resting comfortably, no acute distress HEENT: no scleral icterus, pupils equal round and reactive, no palptable cervical adenopathy,  CV: RRR, no m/r/g, no jvd Resp: Clear to auscultation bilaterally GI: abdomen is soft, non-tender, non-distended, normal bowel sounds, no hepatosplenomegaly MSK: extremities are warm, no edema.  Skin: warm, no rash Neuro:  no focal deficits Psych: appropriate affect   Diagnostic Studies 04/09/14 Cath Hemodynamics:  Central Aortic Pressure / Mean: 126/67/92 mmHg  Left Ventricular Pressure /  LVEDP: 128/10/16 mmHg  Left Ventriculography:  EF:50-65 %  Wall Motion:Normal to hyperdynamic  Coronary Anatomy:  Dominance: Co-dominant  Left Main: Large-caliber vessel that is short. Bifurcates into the LAD and Circumflex. Angiographically normal. WFU:XNATF-[id number] vessel with a very large proximal D1 Esbeidy Mclaine that comes off just prior to a widely patent stent in the early mid vessel. There is a small second diagonal Myrel Rappleye that arises from the stented segment and another small third diagonal Julian Askin more distally. The distal vessel tapers into a moderate caliber vessel that wraps around the apex perfusing the distal third of the inferoapex.  D1:Large-caliber proximal Delainee Tramel with ostial 40-50% focal stenosis. The vessel then courses almost of the ramus intermedius. The vessel is very tortuous gives rise to several mold-moderate caliber branches distally. Beyond the ostial lesion, the vessel remains angiographically normal.  Left Circumflex:Moderate large-caliber, codominant vessel. There is a small moderate caliber first obtuse marginal Chanson Teems before the vessel courses into the AV groove. In the  mid AV groove there is a hairpin turn as the vessel courses distally to provide to posterior lateral branches with the first being moderate caliber and second mean small caliber.  OM1:Small moderate caliber Terrion Gencarelli that does not cover large distribution. Tortuous but free of disease.   RCA: Moderate large-caliber, codominant vessel. Minimal luminal irregularities are vessel tapers down to [place] more moderate caliber right posterior descending arteries. There is a very small posterior lateral system.  After reviewing the initial angiography, no culprit lesion was identified.   10/2015 PFTs Mild ventilatory defect  11/2015 AAA screen [place] No aneurysm  04/2014 echo Study Conclusions  - Left ventricle: The cavity size was normal. Wall thickness was normal. Systolic function was normal. The estimated ejection fraction was in the range of 60% to 65%. Wall motion was normal; there were no regional wall motion abnormalities. Left ventricular diastolic function parameters were normal. - Mitral valve: There was mild regurgitation.  05/2017 nuclear stress  No diagnostic ST segment changes to indicate ischemia.  Small, mild intensity, apical inferior defect that is reversible and consistent with a small ischemic territory. Fixed inferior defect is more consistent with soft tissue attenuation.  This is a low risk study.  Nuclear stress EF: 76%.   06/2017 cath 1. Stable appearance of the coronary arteries since 2016, with 60% ostial D1 stenosis (FFR 0.90), patent mid LAD stent, and mild to moderate diffuse RCA disease. 2. Normal to hyperdynamic left ventricular contraction with low filling pressure (LVEDP 6 mmHg).  Assessment and Plan  1. CAD/chest pain/SOB - extensive cardiac workup over the years for intermittent symptoms. Most recently cath 06/2017 with patent vessels, normal LVEDP - symptoms have resolved since last visit - continue medical therapy at this time. Ranexa too  expensive, we will d/c       Arnoldo Lenis, M.D.

## 2017-07-20 ENCOUNTER — Encounter: Payer: Self-pay | Admitting: Cardiology

## 2017-07-23 ENCOUNTER — Encounter: Payer: Self-pay | Admitting: Nurse Practitioner

## 2017-07-23 ENCOUNTER — Ambulatory Visit: Payer: PPO | Admitting: Nurse Practitioner

## 2017-07-23 VITALS — BP 136/69 | HR 77 | Temp 96.8°F | Ht 69.0 in | Wt 168.4 lb

## 2017-07-23 DIAGNOSIS — K219 Gastro-esophageal reflux disease without esophagitis: Secondary | ICD-10-CM

## 2017-07-23 DIAGNOSIS — Z86010 Personal history of colonic polyps: Secondary | ICD-10-CM | POA: Insufficient documentation

## 2017-07-23 DIAGNOSIS — K5900 Constipation, unspecified: Secondary | ICD-10-CM

## 2017-07-23 NOTE — Patient Instructions (Signed)
1. Continue taking your current medications. 2. Follow-up in 1 year. 3. You will next be due for a colonoscopy in December 2021.  You are on the recall list so we will send you a letter to let you know when it is time to schedule. 4. Call us if you have any questions or concerns.  At [facility] we value your feedback. You may receive a survey about your visit today. Please share your experience as we strive to create trusting relationships with our patients to provide genuine, compassionate, quality care.  It was great to see you today!  Good luck painting and I hope you have a great summer!!

## 2017-07-23 NOTE — Assessment & Plan Note (Signed)
Well-controlled on senna.  Continue current medications, follow-up in 1 year.

## 2017-07-23 NOTE — Assessment & Plan Note (Signed)
Doing well on PPI.  Continue current medications, follow-up in 1 year.

## 2017-07-23 NOTE — Progress Notes (Signed)
Referring Provider: Caryl Bis, MD Primary Care Physician:  Caryl Bis, MD Primary GI:  Dr. Oneida Alar  Chief Complaint  Patient presents with  . Gastroesophageal Reflux    doing ok, pp f/u    HPI:   Eric Benitez is a 77 y.o. male who presents for follow-up on heme positive stool status post colonoscopy.  Patient was last seen in our office 12/26/2016 for GERD, constipation, heme positive stool.  PCP labs found hemoglobin 13.3.  Heme positive on exam.  At his last visit he was doing well overall.  Last colonoscopy 2008 in [place], [place] at [facility].  Has not noted any hematochezia or melena.  Occasional lower abdominal pain in the mornings until he has a bowel movement.  Currently on senna.  Chronic constipation but no straining or hard stools when he is on his laxatives.  History of GERD and does well on Protonix.  Denies NSAIDs and aspirin powders.  Does eat a lot of spicy foods.  No other GI symptoms or red flag/warning signs or symptoms.  Recommended endoscopic evaluation, request previous colonoscopy from [facility], follow-up based on post procedure recommendations.  Colonoscopy completed 01/22/2017 which found six 3 to 6 mm polyps at the splenic flexure, transverse colon, ascending colon.  Redundant colon, external hemorrhoids.  Rectal pathology found the polyps to be tubular adenoma. Recommended repeat exam in 3 years (01/2020).  EGD completed the same day found mild to moderate gastritis.  Recommended colonoscopy in 3 years, follow-up 6 months, continue PPI and other medications.  Today he states he's doing well. GERD doing well on Protonix. Takes Senna which allows him to have daily bowel movement typically consistent with [place] 4. Denies hematochezia, melena, abdominal pain, N/V, fever, chills, unintentional weight loss. Denies chest pain, dyspnea, dizziness, lightheadedness, syncope, near syncope. Denies any other upper or lower GI symptoms.   Has cardiac cath 06/07/17  which found stab;e coronary arteries; no stent.  Past Medical History:  Diagnosis Date  . Arthritis    "neck, hands, fingers" (04/09/2014)  . Bleeds easily ([place])   . CAD (coronary artery disease)   . Hyperlipemia   . Hypertension   . Kidney stones    "I've had several; had to go in and get them once" (04/09/2014)  . Pneumonia 1940's    Past Surgical History:  Procedure Laterality Date  . ANKLE FRACTURE SURGERY Right 2009  . CARDIAC CATHETERIZATION  1990's X 2;  04/09/2014  . COLONOSCOPY N/A 01/22/2017   Procedure: COLONOSCOPY;  Surgeon: Danie Binder, MD;  [facility];  Service: Endoscopy;  Laterality: N/A;  12:30pm  . CORONARY ANGIOPLASTY WITH STENT PLACEMENT  2006   "2"  . CYSTOSCOPY W/ STONE MANIPULATION  ~ 2008  . ESOPHAGOGASTRODUODENOSCOPY N/A 01/22/2017   Procedure: ESOPHAGOGASTRODUODENOSCOPY (EGD);  Surgeon: Danie Binder, MD;  [facility];  Service: Endoscopy;  Laterality: N/A;  . FRACTURE SURGERY    . INTRAVASCULAR PRESSURE WIRE/FFR STUDY N/A 06/08/2017   Procedure: INTRAVASCULAR PRESSURE WIRE/FFR STUDY;  Surgeon: Nelva Bush, MD;  Location: [place] CV LAB;  Service: Cardiovascular;  Laterality: N/A;  . LEFT HEART CATH AND CORONARY ANGIOGRAPHY N/A 06/08/2017   Procedure: LEFT HEART CATH AND CORONARY ANGIOGRAPHY;  Surgeon: Nelva Bush, MD;  Location: [place] CV LAB;  Service: Cardiovascular;  Laterality: N/A;  . LEFT HEART CATHETERIZATION WITH CORONARY ANGIOGRAM N/A 04/09/2014   Procedure: LEFT HEART CATHETERIZATION WITH CORONARY ANGIOGRAM;  Surgeon: Leonie Man, MD;  Location: [place] CATH LAB;  Service: Cardiovascular;  Laterality: N/A;  . NASAL SEPTUM SURGERY  1999  . POLYPECTOMY  01/22/2017   Procedure: POLYPECTOMY;  Surgeon: Danie Binder, MD;  [facility];  Service: Endoscopy;;  right colon x4    Current Outpatient Medications  Medication Sig Dispense Refill  . acetaminophen (TYLENOL) 500 MG tablet Take 1,000 mg by  mouth every 6 (six) hours as needed for moderate pain or headache.    . albuterol (PROVENTIL HFA;VENTOLIN HFA) 108 (90 Base) MCG/ACT inhaler Inhale 1 puff into the lungs as needed for wheezing or shortness of breath. (Patient taking differently: Inhale 1-2 puffs into the lungs every 6 (six) hours as needed for wheezing or shortness of breath. ) 1 Inhaler 2  . aspirin EC 81 MG tablet Take 81 mg by mouth daily.    Marland Kitchen atorvastatin (LIPITOR) 80 MG tablet Take 80 mg by mouth at bedtime.     . Cholecalciferol (VITAMIN D3 PO) Take 1 capsule by mouth at bedtime.     . Cyanocobalamin (VITAMIN B-12 PO) Take 1 tablet by mouth daily.    . diphenhydramine-acetaminophen (TYLENOL PM) 25-500 MG TABS tablet Take 2 tablets by mouth at bedtime.    Marland Kitchen doxazosin (CARDURA) 8 MG tablet Take 8 mg by mouth at bedtime.     . Hypromellose (ARTIFICIAL TEARS OP) Place 1-2 drops into both eyes daily as needed (for dry eyes).    Marland Kitchen loratadine (CLARITIN) 10 MG tablet Take 10 mg by mouth daily.    . meclizine (ANTIVERT) 25 MG tablet Take 25 mg by mouth daily.    . metoprolol succinate (TOPROL-XL) 25 MG 24 hr tablet Take 12.5 mg by mouth daily.     . Multiple Vitamin (MULTIVITAMIN) tablet Take 1 tablet by mouth daily.    . nitroGLYCERIN (NITROSTAT) 0.4 MG SL tablet PLACE 1 TABLET (0.4 MG TOTAL) UNDER THE TONGUE EVERY 5 (FIVE) MINUTES AS NEEDED FOR CHEST PAIN. 25 tablet 3  . pantoprazole (PROTONIX) 40 MG tablet TAKE 1 TABLET BY MOUTH DAILY (Patient taking differently: Take 40 mg by mouth daily) 30 tablet 6  . senna (SENOKOT) 8.6 MG tablet Take 2 tablets by mouth at bedtime.      No current facility-administered medications for this visit.     Allergies as of 07/23/2017 - Review Complete 07/23/2017  Allergen Reaction Noted  . Vancomycin Rash 06/06/2010    Family History  Problem Relation Age of Onset  . Heart disease Mother   . Colon cancer Neg Hx   . Gastric cancer Neg Hx   . Esophageal cancer Neg Hx     Social History     Socioeconomic History  . Marital status: Married    Spouse name: Not on file  . Number of children: Not on file  . Years of education: Not on file  . Highest education level: Not on file  Occupational History  . Not on file  Social Needs  . Financial resource strain: Not on file  . Food insecurity:    Worry: Not on file    Inability: Not on file  . Transportation needs:    Medical: Not on file    Non-medical: Not on file  Tobacco Use  . Smoking status: Former Smoker    Packs/day: 1.50    Years: 22.00    Pack years: 33.00    Types: Cigarettes    Start date: 11/28/1942    Last attempt to quit: 02/07/1964  Years since quitting: 53.4  . Smokeless tobacco: Never Used  Substance and Sexual Activity  . Alcohol use: No    Alcohol/week: 0.0 oz    Frequency: Never  . Drug use: No  . Sexual activity: Yes  Lifestyle  . Physical activity:    Days per week: Not on file    Minutes per session: Not on file  . Stress: Not on file  Relationships  . Social connections:    Talks on phone: Not on file    Gets together: Not on file    Attends religious service: Not on file    Active member of club or organization: Not on file    Attends meetings of clubs or organizations: Not on file    Relationship status: Not on file  Other Topics Concern  . Not on file  Social History Narrative  . Not on file    Review of Systems: Complete ROS negative except as per HPI.   Physical Exam: BP 136/69   Pulse 77   Temp (!) 96.8 F (36 C) (Oral)   Ht 5\' 9"  (1.753 m)   Wt 168 lb 6.4 oz (76.4 kg)   BMI 24.87 kg/m  General:   Alert and oriented. Pleasant and cooperative. Well-nourished and well-developed.  Eyes:  Without icterus, sclera clear and conjunctiva pink.  Ears:  Normal auditory acuity. Cardiovascular:  S1, S2 present without murmurs appreciated. Extremities without clubbing or edema. Respiratory:  Clear to auscultation bilaterally. No wheezes, rales, or rhonchi. No distress.   Gastrointestinal:  +BS, soft, non-tender and non-distended. No HSM noted. No guarding or rebound. No masses appreciated.  Rectal:  Deferred  Musculoskalatal:  Symmetrical without gross deformities. Neurologic:  Alert and oriented x4;  grossly normal neurologically. Psych:  Alert and cooperative. Normal mood and affect. Heme/Lymph/Immune: No excessive bruising noted.    07/23/2017 9:50 AM   Disclaimer: This note was dictated with voice recognition software. Similar sounding words can inadvertently be transcribed and may not be corrected upon review.

## 2017-07-23 NOTE — Progress Notes (Signed)
CC'D TO PCP °

## 2017-07-23 NOTE — Assessment & Plan Note (Signed)
Recent colonoscopy found 6 polyps ranging from 3 to 6 mm in size, tubular adenoma.  Recommend 3-year repeat exam.  Patient is on the recall list.

## 2017-07-31 DIAGNOSIS — Z9189 Other specified personal risk factors, not elsewhere classified: Secondary | ICD-10-CM | POA: Diagnosis not present

## 2017-07-31 DIAGNOSIS — K210 Gastro-esophageal reflux disease with esophagitis: Secondary | ICD-10-CM | POA: Diagnosis not present

## 2017-07-31 DIAGNOSIS — I10 Essential (primary) hypertension: Secondary | ICD-10-CM | POA: Diagnosis not present

## 2017-07-31 DIAGNOSIS — E782 Mixed hyperlipidemia: Secondary | ICD-10-CM | POA: Diagnosis not present

## 2017-07-31 DIAGNOSIS — E7800 Pure hypercholesterolemia, unspecified: Secondary | ICD-10-CM | POA: Diagnosis not present

## 2017-07-31 DIAGNOSIS — M1990 Unspecified osteoarthritis, unspecified site: Secondary | ICD-10-CM | POA: Diagnosis not present

## 2017-08-03 DIAGNOSIS — Z6832 Body mass index (BMI) 32.0-32.9, adult: Secondary | ICD-10-CM | POA: Diagnosis not present

## 2017-08-03 DIAGNOSIS — K219 Gastro-esophageal reflux disease without esophagitis: Secondary | ICD-10-CM | POA: Diagnosis not present

## 2017-08-03 DIAGNOSIS — I25111 Atherosclerotic heart disease of native coronary artery with angina pectoris with documented spasm: Secondary | ICD-10-CM | POA: Diagnosis not present

## 2017-08-03 DIAGNOSIS — I10 Essential (primary) hypertension: Secondary | ICD-10-CM | POA: Diagnosis not present

## 2017-08-03 DIAGNOSIS — N401 Enlarged prostate with lower urinary tract symptoms: Secondary | ICD-10-CM | POA: Diagnosis not present

## 2017-08-03 DIAGNOSIS — Z0001 Encounter for general adult medical examination with abnormal findings: Secondary | ICD-10-CM | POA: Diagnosis not present

## 2017-08-03 DIAGNOSIS — E782 Mixed hyperlipidemia: Secondary | ICD-10-CM | POA: Diagnosis not present

## 2017-09-28 DIAGNOSIS — D519 Vitamin B12 deficiency anemia, unspecified: Secondary | ICD-10-CM | POA: Diagnosis not present

## 2017-09-28 DIAGNOSIS — D529 Folate deficiency anemia, unspecified: Secondary | ICD-10-CM | POA: Diagnosis not present

## 2017-09-28 DIAGNOSIS — D649 Anemia, unspecified: Secondary | ICD-10-CM | POA: Diagnosis not present

## 2017-10-31 ENCOUNTER — Other Ambulatory Visit: Payer: Self-pay | Admitting: Cardiology

## 2017-10-31 NOTE — Telephone Encounter (Signed)
Patient contacted to verify that he has been on lisinopril 2.5 mg daily since 05/15/17. Lisinopril 2.5 mg was removed from medications list inadvertently. Patient confirmed that he has been taking lisinopril 2.5 mg daily and is unsure why it was removed from list. 90-day supply sent to pharmacy.

## 2017-11-05 DIAGNOSIS — K219 Gastro-esophageal reflux disease without esophagitis: Secondary | ICD-10-CM | POA: Diagnosis not present

## 2017-11-05 DIAGNOSIS — E782 Mixed hyperlipidemia: Secondary | ICD-10-CM | POA: Diagnosis not present

## 2017-11-05 DIAGNOSIS — I10 Essential (primary) hypertension: Secondary | ICD-10-CM | POA: Diagnosis not present

## 2017-12-05 DIAGNOSIS — I10 Essential (primary) hypertension: Secondary | ICD-10-CM | POA: Diagnosis not present

## 2017-12-05 DIAGNOSIS — I25111 Atherosclerotic heart disease of native coronary artery with angina pectoris with documented spasm: Secondary | ICD-10-CM | POA: Diagnosis not present

## 2017-12-05 DIAGNOSIS — E782 Mixed hyperlipidemia: Secondary | ICD-10-CM | POA: Diagnosis not present

## 2017-12-05 DIAGNOSIS — K219 Gastro-esophageal reflux disease without esophagitis: Secondary | ICD-10-CM | POA: Diagnosis not present

## 2017-12-05 DIAGNOSIS — N401 Enlarged prostate with lower urinary tract symptoms: Secondary | ICD-10-CM | POA: Diagnosis not present

## 2017-12-10 DIAGNOSIS — Z23 Encounter for immunization: Secondary | ICD-10-CM | POA: Diagnosis not present

## 2018-01-15 ENCOUNTER — Ambulatory Visit: Payer: PPO | Admitting: Cardiology

## 2018-01-15 ENCOUNTER — Encounter: Payer: Self-pay | Admitting: Cardiology

## 2018-01-15 VITALS — BP 129/73 | HR 67 | Ht 69.0 in | Wt 168.0 lb

## 2018-01-15 DIAGNOSIS — I2510 Atherosclerotic heart disease of native coronary artery without angina pectoris: Secondary | ICD-10-CM

## 2018-01-15 DIAGNOSIS — R0789 Other chest pain: Secondary | ICD-10-CM | POA: Diagnosis not present

## 2018-01-15 NOTE — Progress Notes (Signed)
Clinical Summary Mr. Hinton is a 77 y.o.male  seen today for follow up of the following medical problems.    1. CAD - hx of prior stenting 10 years ago - cath 04/09/14 with patent coronaries.  06/2017 cath patent vessels. LVEDP 6.   - no recent chest pain. No SOB/DOE - compliant with meds - starting at [employer], riding stationary bike x 20 minutes without troubles.   2. SOB - 10/2015 PFTs mild ventilatory defect - 05/2017 echo LVEF 60-65%, no WMAs, diastolic function not described.  - 05/2017 lexiscan small apical ischemia, low risk - 06/2017 cath patent vessels. LVEDP 6.   - denies any recent symptoms, has been feeling good    Upcoming labs with pcp    Past Medical History:  Diagnosis Date  . Arthritis    "neck, hands, fingers" (04/09/2014)  . Bleeds easily ([place])   . CAD (coronary artery disease)   . Hyperlipemia   . Hypertension   . Kidney stones    "I've had several; had to go in and get them once" (04/09/2014)  . Pneumonia 1940's     Allergies  Allergen Reactions  . Vancomycin Rash     Current Outpatient Medications  Medication Sig Dispense Refill  . acetaminophen (TYLENOL) 500 MG tablet Take 1,000 mg by mouth every 6 (six) hours as needed for moderate pain or headache.    . albuterol (PROVENTIL HFA;VENTOLIN HFA) 108 (90 Base) MCG/ACT inhaler Inhale 1 puff into the lungs as needed for wheezing or shortness of breath. (Patient taking differently: Inhale 1-2 puffs into the lungs every 6 (six) hours as needed for wheezing or shortness of breath. ) 1 Inhaler 2  . aspirin EC 81 MG tablet Take 81 mg by mouth daily.    Marland Kitchen atorvastatin (LIPITOR) 80 MG tablet Take 80 mg by mouth at bedtime.     . Cholecalciferol (VITAMIN D3 PO) Take 1 capsule by mouth at bedtime.     . Cyanocobalamin (VITAMIN B-12 PO) Take 1 tablet by mouth daily.    . diphenhydramine-acetaminophen (TYLENOL PM) 25-500 MG TABS tablet Take 2 tablets by mouth at bedtime.    Marland Kitchen doxazosin  (CARDURA) 8 MG tablet Take 8 mg by mouth at bedtime.     . Hypromellose (ARTIFICIAL TEARS OP) Place 1-2 drops into both eyes daily as needed (for dry eyes).    Marland Kitchen lisinopril (PRINIVIL,ZESTRIL) 2.5 MG tablet TAKE 1 TABLET BY MOUTH EVERY DAY 90 tablet 1  . loratadine (CLARITIN) 10 MG tablet Take 10 mg by mouth daily.    . meclizine (ANTIVERT) 25 MG tablet Take 25 mg by mouth daily.    . metoprolol succinate (TOPROL-XL) 25 MG 24 hr tablet Take 12.5 mg by mouth daily.     . Multiple Vitamin (MULTIVITAMIN) tablet Take 1 tablet by mouth daily.    . nitroGLYCERIN (NITROSTAT) 0.4 MG SL tablet PLACE 1 TABLET (0.4 MG TOTAL) UNDER THE TONGUE EVERY 5 (FIVE) MINUTES AS NEEDED FOR CHEST PAIN. 25 tablet 3  . pantoprazole (PROTONIX) 40 MG tablet TAKE 1 TABLET BY MOUTH DAILY (Patient taking differently: Take 40 mg by mouth daily) 30 tablet 6  . senna (SENOKOT) 8.6 MG tablet Take 2 tablets by mouth at bedtime.      No current facility-administered medications for this visit.      Past Surgical History:  Procedure Laterality Date  . ANKLE FRACTURE SURGERY Right 2009  . CARDIAC CATHETERIZATION  1990's X 2;  04/09/2014  .  COLONOSCOPY N/A 01/22/2017   Procedure: COLONOSCOPY;  Surgeon: Danie Binder, MD;  [facility];  Service: Endoscopy;  Laterality: N/A;  12:30pm  . CORONARY ANGIOPLASTY WITH STENT PLACEMENT  2006   "2"  . CYSTOSCOPY W/ STONE MANIPULATION  ~ 2008  . ESOPHAGOGASTRODUODENOSCOPY N/A 01/22/2017   Procedure: ESOPHAGOGASTRODUODENOSCOPY (EGD);  Surgeon: Danie Binder, MD;  [facility];  Service: Endoscopy;  Laterality: N/A;  . FRACTURE SURGERY    . INTRAVASCULAR PRESSURE WIRE/FFR STUDY N/A 06/08/2017   Procedure: INTRAVASCULAR PRESSURE WIRE/FFR STUDY;  Surgeon: Nelva Bush, MD;  Location: [place] CV LAB;  Service: Cardiovascular;  Laterality: N/A;  . LEFT HEART CATH AND CORONARY ANGIOGRAPHY N/A 06/08/2017   Procedure: LEFT HEART CATH AND CORONARY ANGIOGRAPHY;  Surgeon:  Nelva Bush, MD;  Location: [place] CV LAB;  Service: Cardiovascular;  Laterality: N/A;  . LEFT HEART CATHETERIZATION WITH CORONARY ANGIOGRAM N/A 04/09/2014   Procedure: LEFT HEART CATHETERIZATION WITH CORONARY ANGIOGRAM;  Surgeon: Leonie Man, MD;  Location: [facility] CATH LAB;  Service: Cardiovascular;  Laterality: N/A;  . NASAL SEPTUM SURGERY  1999  . POLYPECTOMY  01/22/2017   Procedure: POLYPECTOMY;  Surgeon: Danie Binder, MD;  [facility];  Service: Endoscopy;;  right colon x4     Allergies  Allergen Reactions  . Vancomycin Rash      Family History  Problem Relation Age of Onset  . Heart disease Mother   . Colon cancer Neg Hx   . Gastric cancer Neg Hx   . Esophageal cancer Neg Hx      Social History Mr. Weigold reports that he quit smoking about 53 years ago. His smoking use included cigarettes. He started smoking about 75 years ago. He has a 33.00 pack-year smoking history. He has never used smokeless tobacco. Mr. Furukawa reports that he does not drink alcohol.   Review of Systems CONSTITUTIONAL: No weight loss, fever, chills, weakness or fatigue.  HEENT: Eyes: No visual loss, blurred vision, double vision or yellow sclerae.No hearing loss, sneezing, congestion, runny nose or sore throat.  SKIN: No rash or itching.  CARDIOVASCULAR: per hpi RESPIRATORY: No shortness of breath, cough or sputum.  GASTROINTESTINAL: No anorexia, nausea, vomiting or diarrhea. No abdominal pain or blood.  GENITOURINARY: No burning on urination, no polyuria NEUROLOGICAL: No headache, dizziness, syncope, paralysis, ataxia, numbness or tingling in the extremities. No change in bowel or bladder control.  MUSCULOSKELETAL: No muscle, back pain, joint pain or stiffness.  LYMPHATICS: No enlarged nodes. No history of splenectomy.  PSYCHIATRIC: No history of depression or anxiety.  ENDOCRINOLOGIC: No reports of sweating, cold or heat intolerance. No polyuria or polydipsia.   Marland Kitchen   Physical Examination Vitals:   01/15/18 1518  BP: 129/73  Pulse: 67  SpO2: 97%   Vitals:   01/15/18 1518  Weight: 168 lb (76.2 kg)  Height: 5\' 9"  (1.753 m)    Gen: resting comfortably, no acute distress HEENT: no scleral icterus, pupils equal round and reactive, no palptable cervical adenopathy,  CV: RRR, no mr/g, no jvd Resp: Clear to auscultation bilaterally GI: abdomen is soft, non-tender, non-distended, normal bowel sounds, no hepatosplenomegaly MSK: extremities are warm, no edema.  Skin: warm, no rash Neuro:  no focal deficits Psych: appropriate affect   Diagnostic Studies 04/09/14 Cath Hemodynamics:  Central Aortic Pressure / Mean: 126/67/92 mmHg  Left Ventricular Pressure / LVEDP: 128/10/16 mmHg  Left Ventriculography:  EF:50-65 %  Wall Motion:Normal to hyperdynamic  Coronary Anatomy:  Dominance: Co-dominant  Left Main: Large-caliber vessel that is short. Bifurcates into the LAD and Circumflex. Angiographically normal. EML:[id number] vessel with a very large proximal D1 Lansing Sigmon that comes off just prior to a widely patent stent in the early mid vessel. There is a small second diagonal Mccartney Chuba that arises from the stented segment and another small third diagonal Nelly Scriven more distally. The distal vessel tapers into a moderate caliber vessel that wraps around the apex perfusing the distal third of the inferoapex.  D1:Large-caliber proximal Jettie Lazare with ostial 40-50% focal stenosis. The vessel then courses almost of the ramus intermedius. The vessel is very tortuous gives rise to several mold-moderate caliber branches distally. Beyond the ostial lesion, the vessel remains angiographically normal.  Left Circumflex:Moderate large-caliber, codominant vessel. There is a small moderate caliber first obtuse marginal Spero Gunnels before the vessel courses into the AV groove. In the mid AV groove there is a hairpin turn as the vessel courses distally to provide to  posterior lateral branches with the first being moderate caliber and second mean small caliber.  OM1:Small moderate caliber Maddie Brazier that does not cover large distribution. Tortuous but free of disease.   RCA: Moderate large-caliber, codominant vessel. Minimal luminal irregularities are vessel tapers down to [place] more moderate caliber right posterior descending arteries. There is a very small posterior lateral system.  After reviewing the initial angiography, no culprit lesion was identified.   10/2015 PFTs Mild ventilatory defect  11/2015 AAA screen [place] No aneurysm  04/2014 echo Study Conclusions  - Left ventricle: The cavity size was normal. Wall thickness was normal. Systolic function was normal. The estimated ejection fraction was in the range of 60% to 65%. Wall motion was normal; there were no regional wall motion abnormalities. Left ventricular diastolic function parameters were normal. - Mitral valve: There was mild regurgitation.  05/2017 nuclear stress  No diagnostic ST segment changes to indicate ischemia.  Small, mild intensity, apical inferior defect that is reversible and consistent with a small ischemic territory. Fixed inferior defect is more consistent with soft tissue attenuation.  This is a low risk study.  Nuclear stress EF: 76%.   06/2017 cath 1. Stable appearance of the coronary arteries since 2016, with 60% ostial D1 stenosis (FFR 0.90), patent mid LAD stent, and mild to moderate diffuse RCA disease. 2. Normal to hyperdynamic left ventricular contraction with low filling pressure (LVEDP 6 mmHg).     Assessment and Plan   1. CAD/chest pain/SOB -extensive cardiac workup over the years for intermittent symptoms. Most recently cath 06/2017 with patent vessels, normal LVEDP - no recent symptoms, doing well. We will continue current meds    F/u 1 year     Arnoldo Lenis, M.D.

## 2018-01-15 NOTE — Patient Instructions (Signed)

## 2018-01-18 DIAGNOSIS — M1990 Unspecified osteoarthritis, unspecified site: Secondary | ICD-10-CM | POA: Diagnosis not present

## 2018-01-18 DIAGNOSIS — K210 Gastro-esophageal reflux disease with esophagitis: Secondary | ICD-10-CM | POA: Diagnosis not present

## 2018-01-18 DIAGNOSIS — I10 Essential (primary) hypertension: Secondary | ICD-10-CM | POA: Diagnosis not present

## 2018-01-18 DIAGNOSIS — E7800 Pure hypercholesterolemia, unspecified: Secondary | ICD-10-CM | POA: Diagnosis not present

## 2018-01-18 DIAGNOSIS — Z9189 Other specified personal risk factors, not elsewhere classified: Secondary | ICD-10-CM | POA: Diagnosis not present

## 2018-01-21 DIAGNOSIS — K219 Gastro-esophageal reflux disease without esophagitis: Secondary | ICD-10-CM | POA: Diagnosis not present

## 2018-01-21 DIAGNOSIS — I25111 Atherosclerotic heart disease of native coronary artery with angina pectoris with documented spasm: Secondary | ICD-10-CM | POA: Diagnosis not present

## 2018-01-21 DIAGNOSIS — N401 Enlarged prostate with lower urinary tract symptoms: Secondary | ICD-10-CM | POA: Diagnosis not present

## 2018-01-21 DIAGNOSIS — I10 Essential (primary) hypertension: Secondary | ICD-10-CM | POA: Diagnosis not present

## 2018-01-21 DIAGNOSIS — E782 Mixed hyperlipidemia: Secondary | ICD-10-CM | POA: Diagnosis not present

## 2018-01-21 DIAGNOSIS — Z6824 Body mass index (BMI) 24.0-24.9, adult: Secondary | ICD-10-CM | POA: Diagnosis not present

## 2018-03-07 DIAGNOSIS — I10 Essential (primary) hypertension: Secondary | ICD-10-CM | POA: Diagnosis not present

## 2018-03-07 DIAGNOSIS — K219 Gastro-esophageal reflux disease without esophagitis: Secondary | ICD-10-CM | POA: Diagnosis not present

## 2018-04-05 DIAGNOSIS — I10 Essential (primary) hypertension: Secondary | ICD-10-CM | POA: Diagnosis not present

## 2018-04-05 DIAGNOSIS — I2510 Atherosclerotic heart disease of native coronary artery without angina pectoris: Secondary | ICD-10-CM | POA: Diagnosis not present

## 2018-05-23 DIAGNOSIS — K210 Gastro-esophageal reflux disease with esophagitis: Secondary | ICD-10-CM | POA: Diagnosis not present

## 2018-05-23 DIAGNOSIS — I10 Essential (primary) hypertension: Secondary | ICD-10-CM | POA: Diagnosis not present

## 2018-05-23 DIAGNOSIS — D649 Anemia, unspecified: Secondary | ICD-10-CM | POA: Diagnosis not present

## 2018-05-23 DIAGNOSIS — E7800 Pure hypercholesterolemia, unspecified: Secondary | ICD-10-CM | POA: Diagnosis not present

## 2018-05-23 DIAGNOSIS — D529 Folate deficiency anemia, unspecified: Secondary | ICD-10-CM | POA: Diagnosis not present

## 2018-05-27 LAB — IFOBT (OCCULT BLOOD): IFOBT: POSITIVE

## 2018-05-28 ENCOUNTER — Other Ambulatory Visit: Payer: Self-pay

## 2018-05-28 ENCOUNTER — Encounter: Payer: Self-pay | Admitting: Gastroenterology

## 2018-05-28 ENCOUNTER — Other Ambulatory Visit: Payer: Self-pay | Admitting: *Deleted

## 2018-05-28 ENCOUNTER — Ambulatory Visit (INDEPENDENT_AMBULATORY_CARE_PROVIDER_SITE_OTHER): Payer: PPO | Admitting: Gastroenterology

## 2018-05-28 ENCOUNTER — Telehealth: Payer: Self-pay | Admitting: Gastroenterology

## 2018-05-28 DIAGNOSIS — K921 Melena: Secondary | ICD-10-CM

## 2018-05-28 NOTE — Progress Notes (Addendum)
REVIEWED. PT HAVING MELENA SINCE LAST THUR ON ASA/PPI. LAST TCS/EGD DEC 2018. SCHEDULE EGD FRI APR 24 WITH MODERATE SEDATION.   Primary Care Physician:  Caryl Bis, MD Primary GI:  Barney Drain, MD   Patient Location: Home  Provider Location: [facility] [place] office  Reason for Visit: rectal bleeding  Persons present on the virtual encounter, with roles: Patient, myself (provider), Christ Kick, CMA (updated meds and allergies)  Total time (minutes) spent on medical discussion: 20 minutes  Due to COVID-19, visit was conducted using Zoom method.  Visit was requested by patient.  Virtual Visit via Zoom  I connected with Eric Benitez on 05/28/18 at 10:30 AM EDT by Zoom and verified that I am speaking with the correct person using two identifiers.   I discussed the limitations, risks, security and privacy concerns of performing an evaluation and management service by telephone/video and the availability of in person appointments. I also discussed with the patient that there may be a patient responsible charge related to this service. The patient expressed understanding and agreed to proceed.  Chief Complaint  Patient presents with  . Rectal Bleeding    Heavy,hurting in stomach near belt area of pants    HPI:   Eric Benitez is a 78 y.o. male who presents for virtual visit regarding rectal bleeding.  Patient was last seen in June 2019.  He had a colonoscopy in December 2018 and was found to have six 3 to 6 mm polyps at the splenic flexure, transverse colon, ascending colon.  Redundant colon, external hemorrhoids.  Pathology consistent with tubular adenomas.  Recommended repeat exam in 3 years.  He had a EGD at the same time that showed mild to moderate gastritis.  Two weeks ago dark stools. Last Thursday/friday black and sticky. Some pain below belt, started two weeks ago. No N/V. A lot of gas. No heartburn. On ASA. No other blood thinners.  Patient reports heme positive stool  through PCP.  Blood work done as well was "fine".  We have requested records.  Also complaining of fatigue and lack of energy.  Worse lately.  Chronically has DOE.  No decline.  Denies any other NSAID use.  Takes a daily aspirin.  No recent iron or Pepto-Bismol.  Denies any bright red blood per rectum.  No constipation or diarrhea.  Reflux well controlled.  No dysphagia.   Current Outpatient Medications  Medication Sig Dispense Refill  . aspirin EC 81 MG tablet Take 81 mg by mouth daily.    Marland Kitchen atorvastatin (LIPITOR) 80 MG tablet Take 80 mg by mouth at bedtime.     . Cyanocobalamin (VITAMIN B-12 PO) Take 1 tablet by mouth daily.    . diphenhydramine-acetaminophen (TYLENOL PM) 25-500 MG TABS tablet Take 1 tablet by mouth at bedtime.     Marland Kitchen doxazosin (CARDURA) 8 MG tablet Take 8 mg by mouth at bedtime.     . Hypromellose (ARTIFICIAL TEARS OP) Place 1-2 drops into both eyes daily as needed (for dry eyes).    Marland Kitchen lisinopril (PRINIVIL,ZESTRIL) 2.5 MG tablet TAKE 1 TABLET BY MOUTH EVERY DAY 90 tablet 1  . loratadine (CLARITIN) 10 MG tablet Take 10 mg by mouth daily.    . metoprolol succinate (TOPROL-XL) 25 MG 24 hr tablet Take 25 mg by mouth daily.     . Multiple Vitamin (MULTIVITAMIN) tablet Take 1 tablet by mouth daily.    . nitroGLYCERIN (NITROSTAT) 0.4 MG SL tablet PLACE 1 TABLET (0.4 MG TOTAL) UNDER  THE TONGUE EVERY 5 (FIVE) MINUTES AS NEEDED FOR CHEST PAIN. 25 tablet 3  . pantoprazole (PROTONIX) 40 MG tablet TAKE 1 TABLET BY MOUTH DAILY (Patient taking differently: Take 40 mg by mouth daily) 30 tablet 6  . ranolazine (RANEXA) 500 MG 12 hr tablet Take 1 tablet by mouth 2 (two) times daily.    Marland Kitchen senna (SENOKOT) 8.6 MG tablet Take 2 tablets by mouth at bedtime.      No current facility-administered medications for this visit.     Past Medical History:  Diagnosis Date  . Arthritis    "neck, hands, fingers" (04/09/2014)  . Bleeds easily ([place])   . CAD (coronary artery disease)   . Hyperlipemia   .  Hypertension   . Kidney stones    "I've had several; had to go in and get them once" (04/09/2014)  . Pneumonia 1940's    Past Surgical History:  Procedure Laterality Date  . ANKLE FRACTURE SURGERY Right 2009  . CARDIAC CATHETERIZATION  1990's X 2;  04/09/2014  . COLONOSCOPY N/A 01/22/2017   Dr. Oneida Alar: 6 tubular adenomas removed, external hemorrhoids.  Recommended next exam in 3 years.  . CORONARY ANGIOPLASTY WITH STENT PLACEMENT  2006   "2"  . CYSTOSCOPY W/ STONE MANIPULATION  ~ 2008  . ESOPHAGOGASTRODUODENOSCOPY N/A 01/22/2017   Dr. Oneida Alar: Mild to moderate gastritis.  No H. pylori  . FRACTURE SURGERY    . INTRAVASCULAR PRESSURE WIRE/FFR STUDY N/A 06/08/2017   Procedure: INTRAVASCULAR PRESSURE WIRE/FFR STUDY;  Surgeon: Nelva Bush, MD;  Location: [facility];  Service: Cardiovascular;  Laterality: N/A;  . LEFT HEART CATH AND CORONARY ANGIOGRAPHY N/A 06/08/2017   Procedure: LEFT HEART CATH AND CORONARY ANGIOGRAPHY;  Surgeon: Nelva Bush, MD;  Location: [facility] [facility];  Service: Cardiovascular;  Laterality: N/A;  . LEFT HEART CATHETERIZATION WITH CORONARY ANGIOGRAM N/A 04/09/2014   Procedure: LEFT HEART CATHETERIZATION WITH CORONARY ANGIOGRAM;  Surgeon: Leonie Man, MD;  Location: [facility];  Service: Cardiovascular;  Laterality: N/A;  . NASAL SEPTUM SURGERY  1999  . POLYPECTOMY  01/22/2017   Procedure: POLYPECTOMY;  Surgeon: Danie Binder, MD;  Location: [facility];  Service: Endoscopy;;  right colon x4    Family History  Problem Relation Age of Onset  . Heart disease Mother   . Colon cancer Neg Hx   . Gastric cancer Neg Hx   . Esophageal cancer Neg Hx     Social History   Socioeconomic History  . Marital status: Married    Spouse name: Not on file  . Number of children: Not on file  . Years of education: Not on file  . Highest education level: Not on file  Occupational History  . Not on file  Social Needs  . Financial resource strain: Not on  file  . Food insecurity:    Worry: Not on file    Inability: Not on file  . Transportation needs:    Medical: Not on file    Non-medical: Not on file  Tobacco Use  . Smoking status: Former Smoker    Packs/day: 1.50    Years: 22.00    Pack years: 33.00    Types: Cigarettes    Start date: 11/28/1942    Last attempt to quit: 02/07/1964    Years since quitting: 54.3  . Smokeless tobacco: Never Used  Substance and Sexual Activity  . Alcohol use: No    Alcohol/week: 0.0 standard drinks    Frequency: Never  .  Drug use: No  . Sexual activity: Yes  Lifestyle  . Physical activity:    Days per week: Not on file    Minutes per session: Not on file  . Stress: Not on file  Relationships  . Social connections:    Talks on phone: Not on file    Gets together: Not on file    Attends religious service: Not on file    Active member of club or organization: Not on file    Attends meetings of clubs or organizations: Not on file    Relationship status: Not on file  . Intimate partner violence:    Fear of current or ex partner: Not on file    Emotionally abused: Not on file    Physically abused: Not on file    Forced sexual activity: Not on file  Other Topics Concern  . Not on file  Social History Narrative  . Not on file      ROS:  General: Negative for anorexia, weight loss, fever, chills, fatigue, weakness. Eyes: Negative for vision changes.  ENT: Negative for hoarseness, difficulty swallowing , nasal congestion. CV: Negative for chest pain, angina, palpitations, dyspnea on exertion, peripheral edema.  Respiratory: Negative for dyspnea at rest, dyspnea on exertion, cough, sputum, wheezing.  GI: See history of present illness. GU:  Negative for dysuria, hematuria, urinary incontinence, urinary frequency, nocturnal urination.  MS: Negative for joint pain, low back pain.  Derm: Negative for rash or itching.  Neuro: Negative for weakness, abnormal sensation, seizure, frequent  headaches, memory loss, confusion.  Psych: Negative for anxiety, depression, suicidal ideation, hallucinations.  Endo: Negative for unusual weight change.  Heme: Negative for bruising or bleeding. Allergy: Negative for rash or hives.   Observations/Objective: Looks good. Good color. No acute distress. No SOB with talking. Daughter Abigail Butts and mother present.   Labs from 05/27/2018: WBC 5800, H/H 12.7L/38.7, Plt 184000, iron 92, TIBC 279, ferritin 100, B12 1647, Folate 10.1, iron sat 33%, BUN 16, Creatinine 1.23, glucose 113, Tbili 0.6, AP 68, AST 21, ALT 18, albumin 4.6  Assessment and Plan: Pleasant 78 year old gentleman presenting via virtual visit with complaints of melena for the past 1 to 2 weeks.  Reports heme positive stool.  Recent labs with PCP as outlined above. Hgb 12.7 slightly low. I don't have baseline.  He is on aspirin daily along with PPI.  EGD in 2018 with gastritis but no H. pylori.  Would be concerned for upper GI bleed or small bowel versus proximal colonic bleed.  He denies any fresh blood per rectum.  Last colonoscopy December 2018 due December 2021.  Would anticipate upper endoscopy in the near future.  I have discussed the risks, alternatives, benefits with regards to but not limited to the risk of reaction to medication, bleeding, infection, perforation and the patient is agreeable to proceed. Written consent to be obtained.  Will address with Dr. Oneida Alar in the setting of COVID-19 crisis is worse timing.  Follow Up Instructions:    I discussed the assessment and treatment plan with the patient. The patient was provided an opportunity to ask questions and all were answered. The patient agreed with the plan and demonstrated an understanding of the instructions. AVS mailed to patient's home address.   The patient was advised to call back or seek an in-person evaluation if the symptoms worsen or if the condition fails to improve as anticipated.  I provided 20 minutes of  virtual face-to-face time during this encounter.  Neil Crouch, PA-C

## 2018-05-28 NOTE — Telephone Encounter (Signed)
Called spoke with patient and is aware of recs below. He voiced understanding. He is scheduled for EGD 4/24 at 8:30am. Aware he needs to arrive at 7:30am at [facility]. Discussed EGD instructions with patient in detail and he voiced understanding. Orders entered.

## 2018-05-28 NOTE — Telephone Encounter (Signed)
Spoke with Dr. Oneida Alar. She is advising EGD with conscious sedation on Friday 4/24. Dx: melena.   Please schedule patient. Let him know his hemoglobin was slightly below normal. If he feels increased weakness, lightheadedness in the interim he can go to ED at [facility].

## 2018-05-28 NOTE — Patient Instructions (Addendum)
1. Continue pantoprazole daily before breakfast. 2. We reviewed your labs, your hemoglobin is 12.7 (slightly low - normal is above 13).  3. We will be in touch regarding next step once received information from Dr. Oneida Alar.

## 2018-05-29 NOTE — Progress Notes (Signed)
cc'd to pcp 

## 2018-05-31 ENCOUNTER — Ambulatory Visit (HOSPITAL_COMMUNITY)
Admission: RE | Admit: 2018-05-31 | Discharge: 2018-05-31 | Disposition: A | Discharge status: Home / Self Care | Payer: PPO | Attending: Gastroenterology | Admitting: Gastroenterology

## 2018-05-31 ENCOUNTER — Other Ambulatory Visit: Payer: Self-pay

## 2018-05-31 ENCOUNTER — Encounter (HOSPITAL_COMMUNITY): Payer: Self-pay | Admitting: *Deleted

## 2018-05-31 ENCOUNTER — Encounter (HOSPITAL_COMMUNITY)
Admission: RE | Disposition: A | Discharge status: Home / Self Care | Payer: Self-pay | Source: Home / Self Care | Attending: Gastroenterology

## 2018-05-31 DIAGNOSIS — E785 Hyperlipidemia, unspecified: Secondary | ICD-10-CM | POA: Insufficient documentation

## 2018-05-31 DIAGNOSIS — K2970 Gastritis, unspecified, without bleeding: Secondary | ICD-10-CM | POA: Diagnosis not present

## 2018-05-31 DIAGNOSIS — I2510 Atherosclerotic heart disease of native coronary artery without angina pectoris: Secondary | ICD-10-CM | POA: Insufficient documentation

## 2018-05-31 DIAGNOSIS — Z7982 Long term (current) use of aspirin: Secondary | ICD-10-CM | POA: Diagnosis not present

## 2018-05-31 DIAGNOSIS — K921 Melena: Secondary | ICD-10-CM | POA: Insufficient documentation

## 2018-05-31 DIAGNOSIS — Z951 Presence of aortocoronary bypass graft: Secondary | ICD-10-CM | POA: Insufficient documentation

## 2018-05-31 DIAGNOSIS — Z79899 Other long term (current) drug therapy: Secondary | ICD-10-CM | POA: Diagnosis not present

## 2018-05-31 DIAGNOSIS — Z87891 Personal history of nicotine dependence: Secondary | ICD-10-CM | POA: Diagnosis not present

## 2018-05-31 DIAGNOSIS — I10 Essential (primary) hypertension: Secondary | ICD-10-CM | POA: Diagnosis not present

## 2018-05-31 HISTORY — PX: ESOPHAGOGASTRODUODENOSCOPY: SHX5428

## 2018-05-31 LAB — CBC
HCT: 36.6 % — ABNORMAL LOW (ref 39.0–52.0)
Hemoglobin: 12.2 g/dL — ABNORMAL LOW (ref 13.0–17.0)
MCH: 31.8 pg (ref 26.0–34.0)
MCHC: 33.3 g/dL (ref 30.0–36.0)
MCV: 95.3 fL (ref 80.0–100.0)
Platelets: 160 10*3/uL (ref 150–400)
RBC: 3.84 MIL/uL — ABNORMAL LOW (ref 4.22–5.81)
RDW: 13.6 % (ref 11.5–15.5)
WBC: 6.6 10*3/uL (ref 4.0–10.5)
nRBC: 0 % (ref 0.0–0.2)

## 2018-05-31 SURGERY — EGD (ESOPHAGOGASTRODUODENOSCOPY)
Anesthesia: Moderate Sedation

## 2018-05-31 MED ORDER — MEPERIDINE HCL 100 MG/ML IJ SOLN
INTRAMUSCULAR | Status: AC
  Filled 2018-05-31: qty 1

## 2018-05-31 MED ORDER — MIDAZOLAM HCL 5 MG/5ML IJ SOLN
INTRAMUSCULAR | Status: DC | PRN
  Administered 2018-05-31 (×2): 2 mg via INTRAVENOUS

## 2018-05-31 MED ORDER — MEPERIDINE HCL 100 MG/ML IJ SOLN
INTRAMUSCULAR | Status: DC | PRN
  Administered 2018-05-31: 25 mg

## 2018-05-31 MED ORDER — LIDOCAINE VISCOUS HCL 2 % MT SOLN
OROMUCOSAL | Status: DC | PRN
  Administered 2018-05-31: 1 via OROMUCOSAL

## 2018-05-31 MED ORDER — MIDAZOLAM HCL 5 MG/5ML IJ SOLN
INTRAMUSCULAR | Status: AC
  Filled 2018-05-31: qty 5

## 2018-05-31 MED ORDER — SODIUM CHLORIDE 0.9 % IV SOLN
INTRAVENOUS | Status: DC
  Administered 2018-05-31: 08:00:00 via INTRAVENOUS

## 2018-05-31 NOTE — Op Note (Signed)
[facility] Patient Name: Eric Benitez Procedure Date: 05/31/2018 7:49 AM MRN: [id number] Date of Birth: [birth year] Attending MD: Barney Drain MD, MD CSN: [id number] Age: 78 Admit Type: Outpatient Procedure:                Upper GI endoscopy, DIAGNOSTIC Indications:              Melena ON ASA/PROTONIX. LAST EVENT: APR 17. LAST                            EGD/TCS DEC 2018 FOR HEME POSITIVE STOOL, MAY 2019:                            Hb 12.4, MCV 92.5 Providers:                Barney Drain MD, MD, Janeece Riggers, RN, Nelma Rothman,                            Technician Referring MD:             Mitzie Na. Daniel MD, MD Medicines:                Meperidine 25 mg IV, Midazolam 4 mg IV Complications:            No immediate complications. Estimated Blood Loss:     Estimated blood loss: none. Procedure:                Pre-Anesthesia Assessment:                           - Prior to the procedure, a History and Physical                            was performed, and patient medications and                            allergies were reviewed. The patient's tolerance of                            previous anesthesia was also reviewed. The risks                            and benefits of the procedure and the sedation                            options and risks were discussed with the patient.                            All questions were answered, and informed consent                            was obtained. Prior Anticoagulants: The patient has                            taken no previous anticoagulant or antiplatelet  agents except for aspirin. ASA Grade Assessment: II                            - A patient with mild systemic disease. After                            reviewing the risks and benefits, the patient was                            deemed in satisfactory condition to undergo the                            procedure. After obtaining informed consent, the                        endoscope was passed under direct vision.                            Throughout the procedure, the patient's blood                            pressure, pulse, and oxygen saturations were                            monitored continuously. The [id number]-[id number] ([id number])                            scope was introduced through the mouth, and                            advanced to the second part of duodenum. The upper                            GI endoscopy was accomplished without difficulty.                            The patient tolerated the procedure well. Scope In: 8:49:03 AM Scope Out: 8:53:12 AM Total Procedure Duration: 0 hours 4 minutes 9 seconds  Findings:      The examined esophagus was normal.      Diffuse moderate inflammation characterized by congestion (edema) and       erythema was found in the entire examined stomach.      The examined duodenum was normal. Impression:               - Gastritis DUE TO ASA.                           - NO OBVIOUS SOURCE FOR MELENA IDENTIFIED Moderate Sedation:      Moderate (conscious) sedation was administered by the endoscopy nurse       and supervised by the endoscopist. The following parameters were       monitored: oxygen saturation, heart rate, blood pressure, and response       to care. Total physician intraservice time was 14 minutes. Recommendation:           -  Patient has a contact number available for                            emergencies. The signs and symptoms of potential                            delayed complications were discussed with the                            patient. Return to normal activities tomorrow.                            Written discharge instructions were provided to the                            patient.                           - CBC TODAY. WILL NEED GIVENS CAPSULE STUDY IF HE                            DEVELOPS MELENA.                           - Low fat diet.                            - Continue present medications. CONTINUE ASA AND                            PROTONIX.                           - Return to my office in 4 months. Procedure Code(s):        --- Professional ---                           [id number], Esophagogastroduodenoscopy, flexible,                            transoral; diagnostic, including collection of                            specimen(s) by brushing or washing, when performed                            (separate procedure)                           G0500, Moderate sedation services provided by the                            same physician or other qualified health care                            professional performing a gastrointestinal  endoscopic service that sedation supports,                            requiring the presence of an independent trained                            observer to assist in the monitoring of the                            patient's level of consciousness and physiological                            status; initial 15 minutes of intra-service time;                            patient age 53 years or older (additional time may                            be reported with [phone], as appropriate) Diagnosis Code(s):        --- Professional ---                           K29.70, Gastritis, unspecified, without bleeding                           K92.1, Melena (includes Hematochezia) CPT copyright 2019 [employer]. All rights reserved. The codes documented in this report are preliminary and upon coder review may  be revised to meet current compliance requirements. Barney Drain, MD Barney Drain MD, MD 05/31/2018 9:14:25 AM This report has been signed electronically. Number of Addenda: 0

## 2018-05-31 NOTE — Progress Notes (Addendum)
Discharge instructions given to patient's wife Joycelyn Schmid Gerard by phone due to COVID 19 restrictions.   She verbalized understanding and has number to call if any questions

## 2018-05-31 NOTE — Discharge Instructions (Signed)
NO SOURCE FOR YOUR BLACK TARRY STOOLS WAS IDENTIFIED. You have mild gastritis due to aspirin. YOUR SMALL BOWEL LOOKED NORMAL.   DRINK WATER TO KEEP YOUR URINE LIGHT YELLOW.  FOLLOW A LOW FAT DIET. MEATS SHOULD BE BAKED, BROILED, OR BOILED. AVOID FRIED FOODS. SEE INFO BELOW.  TO PREVENT ULCERS AND TO TREAT GASTRITIS DUE TO DAILY ASPIRIN USE, CONTINUE [place].  TAKE 30 MINUTES PRIOR TO YOUR FIRST MEAL EVERY DAY.   I WILL CHECK YOUR BLOOD COUNT TODAY.  PLEASE CALL IF THE BLACK TARRY STOOL RETURN AND THEN YOU WILL NEED A SMALL BOWEL CAPSULE  STUDY.   FOLLOW UP IN 4 MOS.  UPPER ENDOSCOPY AFTER CARE Read the instructions outlined below and refer to this sheet in the next week. These discharge instructions provide you with general information on caring for yourself after you leave the hospital. While your treatment has been planned according to the most current medical practices available, unavoidable complications occasionally occur. If you have any problems or questions after discharge, call DR. Majestic Molony, [phone].  ACTIVITY  You may resume your regular activity, but move at a slower pace for the next 24 hours.   Take frequent rest periods for the next 24 hours.   Walking will help get rid of the air and reduce the bloated feeling in your belly (abdomen).   No driving for 24 hours (because of the medicine (anesthesia) used during the test).   You may shower.   Do not sign any important legal documents or operate any machinery for 24 hours (because of the anesthesia used during the test).    NUTRITION  Drink plenty of fluids.   You may resume your normal diet as instructed by your doctor.   Begin with a light meal and progress to your normal diet. Heavy or fried foods are harder to digest and may make you feel sick to your stomach (nauseated).   Avoid alcoholic beverages for 24 hours or as instructed.    MEDICATIONS  You may resume your normal medications.   WHAT YOU  CAN EXPECT TODAY  Some feelings of bloating in the abdomen.   Passage of more gas than usual.    IF YOU HAD A BIOPSY TAKEN DURING THE UPPER ENDOSCOPY:  Eat a soft diet IF YOU HAVE NAUSEA, BLOATING, ABDOMINAL PAIN, OR VOMITING.    FINDING OUT THE RESULTS OF YOUR TEST Not all test results are available during your visit. DR. Oneida Alar WILL CALL YOU WITHIN 14 DAYS OF YOUR PROCEDUE WITH YOUR RESULTS. Do not assume everything is normal if you have not heard from DR. Basim Bartnik, CALL HER OFFICE AT [phone].  SEEK IMMEDIATE MEDICAL ATTENTION AND CALL THE OFFICE: [phone] IF:  You have more than a spotting of blood in your stool.   Your belly is swollen (abdominal distention).   You are nauseated or vomiting.   You have a temperature over 101F.   You have abdominal pain or discomfort that is severe or gets worse throughout the day.   Gastritis  Gastritis is an inflammation (the body's way of reacting to injury and/or infection) of the stomach. It is often caused by viral or bacterial (germ) infections. It can also be caused BY ASPIRIN, BC/GOODY POWDER'S, (IBUPROFEN) MOTRIN, OR ALEVE (NAPROXEN), chemicals (including alcohol), SPICY FOODS, and medications. This illness may be associated with generalized malaise (feeling tired, not well), UPPER ABDOMINAL STOMACH cramps, and fever. One common bacterial cause of gastritis is an organism known as H. Pylori. This can be  treated with antibiotics.    Low-Fat Diet  BREADS, CEREALS, PASTA, RICE, DRIED PEAS, AND BEANS These products are high in carbohydrates and most are low in fat. Therefore, they can be increased in the diet as substitutes for fatty foods. They too, however, contain calories and should not be eaten in excess. Cereals can be eaten for snacks as well as for breakfast.  Include foods that contain fiber (fruits, vegetables, whole grains, and legumes). Research shows that fiber may lower blood cholesterol levels, especially the  water-soluble fiber found in fruits, vegetables, oat products, and legumes.  FRUITS AND VEGETABLES It is good to eat fruits and vegetables. Besides being sources of fiber, both are rich in vitamins and some minerals. They help you get the daily allowances of these nutrients. Fruits and vegetables can be used for snacks and desserts.  MEATS Limit lean meat, chicken, [place], and fish to no more than 6 ounces per day.  Beef, Pork, and Lamb Use lean cuts of beef, pork, and lamb. Lean cuts include:  Extra-lean ground beef.  Arm roast.  Sirloin tip.  Center-cut ham.  Round steak.  Loin chops.  Rump roast.  Tenderloin.  Trim all fat off the outside of meats before cooking. It is not necessary to severely decrease the intake of red meat, but lean choices should be made. Lean meat is rich in protein and contains a highly absorbable form of iron. Premenopausal women, in particular, should avoid reducing lean red meat because this could increase the risk for low red blood cells (iron-deficiency anemia).  Chicken and [place] These are good sources of protein. The fat of poultry can be reduced by removing the skin and underlying fat layers before cooking. Chicken and [place] can be substituted for lean red meat in the diet. Poultry should not be fried or covered with high-fat sauces.  Fish and Shellfish Fish is a good source of protein. Shellfish contain cholesterol, but they usually are low in saturated fatty acids. The preparation of fish is important. Like chicken and [place], they should not be fried or covered with high-fat sauces.  EGGS Egg whites contain no fat or cholesterol. They can be eaten often. Try 1 to 2 egg whites instead of whole eggs in recipes or use egg substitutes that do not contain yolk.  MILK AND DAIRY PRODUCTS Use skim or 1% milk instead of 2% or whole milk. Decrease whole milk, natural, and processed cheeses. Use nonfat or low-fat (2%) cottage cheese or low-fat cheeses made  from vegetable oils. Choose nonfat or low-fat (1 to 2%) yogurt. Experiment with evaporated skim milk in recipes that call for heavy cream. Substitute low-fat yogurt or low-fat cottage cheese for sour cream in dips and salad dressings. Have at least 2 servings of low-fat dairy products, such as 2 glasses of skim (or 1%) milk each day to help get your daily calcium intake.  FATS AND OILS Reduce the total intake of fats, especially saturated fat. Butterfat, lard, and beef fats are high in saturated fat and cholesterol. These should be avoided as much as possible. Vegetable fats do not contain cholesterol, but certain vegetable fats, such as coconut oil, palm oil, and palm kernel oil are very high in saturated fats. These should be limited. These fats are often used in bakery goods, processed foods, popcorn, oils, and nondairy creamers. Vegetable shortenings and some peanut butters contain hydrogenated oils, which are also saturated fats. Read the labels on these foods and check for saturated vegetable oils.  Unsaturated vegetable oils and fats do not raise blood cholesterol. However, they should be limited because they are fats and are high in calories. Total fat should still be limited to 30% of your daily caloric intake. Desirable liquid vegetable oils are corn oil, cottonseed oil, olive oil, canola oil, safflower oil, soybean oil, and sunflower oil. Peanut oil is not as good, but small amounts are acceptable. Buy a heart-healthy tub margarine that has no partially hydrogenated oils in the ingredients. Mayonnaise and salad dressings often are made from unsaturated fats, but they should also be limited because of their high calorie and fat content. Seeds, nuts, peanut butter, olives, and avocados are high in fat, but the fat is mainly the unsaturated type. These foods should be limited mainly to avoid excess calories and fat.  OTHER EATING TIPS Snacks  Most sweets should be limited as snacks. They tend to be  rich in calories and fats, and their caloric content outweighs their nutritional value. Some good choices in snacks are graham crackers, melba toast, soda crackers, bagels (no egg), English muffins, fruits, and vegetables. These snacks are preferable to snack crackers, [place] fries, and chips. Popcorn should be air-popped or cooked in small amounts of liquid vegetable oil.  Desserts Eat fruit, low-fat yogurt, and fruit ices instead of pastries, cake, and cookies. Sherbet, angel food cake, gelatin dessert, frozen low-fat yogurt, or other frozen products that do not contain saturated fat (pure fruit juice bars, frozen ice pops) are also acceptable.   COOKING METHODS Choose those methods that use little or no fat. They include: Poaching.  Braising.  Steaming.  Grilling.  Baking.  Stir-frying.  Broiling.  Microwaving.  Foods can be cooked in a nonstick pan without added fat, or use a nonfat cooking spray in regular cookware. Limit fried foods and avoid frying in saturated fat. Add moisture to lean meats by using water, broth, cooking wines, and other nonfat or low-fat sauces along with the cooking methods mentioned above. Soups and stews should be chilled after cooking. The fat that forms on top after a few hours in the refrigerator should be skimmed off. When preparing meals, avoid using excess salt. Salt can contribute to raising blood pressure in some people.  EATING AWAY FROM HOME Order entres, potatoes, and vegetables without sauces or butter. When meat exceeds the size of a deck of cards (3 to 4 ounces), the rest can be taken home for another meal. Choose vegetable or fruit salads and ask for low-calorie salad dressings to be served on the side. Use dressings sparingly. Limit high-fat toppings, such as bacon, crumbled eggs, cheese, sunflower seeds, and olives. Ask for heart-healthy tub margarine instead of butter.

## 2018-05-31 NOTE — H&P (Signed)
Primary Care Physician:  Caryl Bis, MD Primary Gastroenterologist:  Dr. Oneida Alar  Pre-Procedure History & Physical: HPI:  Eric Benitez is a 78 y.o. male here for  [place].  Past Medical History:  Diagnosis Date  . Arthritis    "neck, hands, fingers" (04/09/2014)  . Bleeds easily ([place])   . CAD (coronary artery disease)   . Hyperlipemia   . Hypertension   . Kidney stones    "I've had several; had to go in and get them once" (04/09/2014)  . Pneumonia 1940's    Past Surgical History:  Procedure Laterality Date  . ANKLE FRACTURE SURGERY Right 2009  . CARDIAC CATHETERIZATION  1990's X 2;  04/09/2014  . COLONOSCOPY N/A 01/22/2017   Dr. Oneida Alar: 6 tubular adenomas removed, external hemorrhoids.  Recommended next exam in 3 years.  . CORONARY ANGIOPLASTY WITH STENT PLACEMENT  2006   "2"  . CYSTOSCOPY W/ STONE MANIPULATION  ~ 2008  . ESOPHAGOGASTRODUODENOSCOPY N/A 01/22/2017   Dr. Oneida Alar: Mild to moderate gastritis.  No H. pylori  . FRACTURE SURGERY    . INTRAVASCULAR PRESSURE WIRE/FFR STUDY N/A 06/08/2017   Procedure: INTRAVASCULAR PRESSURE WIRE/FFR STUDY;  Surgeon: Nelva Bush, MD;  Location: [facility];  Service: Cardiovascular;  Laterality: N/A;  . LEFT HEART CATH AND CORONARY ANGIOGRAPHY N/A 06/08/2017   Procedure: LEFT HEART CATH AND CORONARY ANGIOGRAPHY;  Surgeon: Nelva Bush, MD;  Location: [facility];  Service: Cardiovascular;  Laterality: N/A;  . LEFT HEART CATHETERIZATION WITH CORONARY ANGIOGRAM N/A 04/09/2014   Procedure: LEFT HEART CATHETERIZATION WITH CORONARY ANGIOGRAM;  Surgeon: Leonie Man, MD;  Location: [facility] C[facility];  Service: Cardiovascular;  Laterality: N/A;  . NASAL SEPTUM SURGERY  1999  . POLYPECTOMY  01/22/2017   Procedure: POLYPECTOMY;  Surgeon: Danie Binder, MD;  Location: [facility];  Service: Endoscopy;;  right colon x4    Prior to Admission medications   Medication Sig Start Date End Date Taking? Authorizing Provider   acetaminophen (TYLENOL) 500 MG tablet Take 1,000 mg by mouth every 6 (six) hours as needed for moderate pain or headache.   Yes [provider]  albuterol (VENTOLIN HFA) 108 (90 Base) MCG/ACT inhaler Inhale 2 puffs into the lungs every 6 (six) hours as needed for wheezing or shortness of breath.   Yes [provider]  aspirin EC 81 MG tablet Take 81 mg by mouth daily.   Yes [provider]  atorvastatin (LIPITOR) 80 MG tablet Take 80 mg by mouth at bedtime.    Yes [provider]  diphenhydramine-acetaminophen (TYLENOL PM) 25-500 MG TABS tablet Take 2 tablets by mouth at bedtime.    Yes [provider]  doxazosin (CARDURA) 8 MG tablet Take 8 mg by mouth at bedtime.    Yes [provider]  Fluticasone Propionate (ALLERGY RELIEF NA) Place 2 sprays into the nose daily as needed (allergies).   Yes [provider]  Hypromellose (ARTIFICIAL TEARS OP) Place 1-2 drops into both eyes daily as needed (for dry eyes).   Yes [provider]  lisinopril (PRINIVIL,ZESTRIL) 2.5 MG tablet TAKE 1 TABLET BY MOUTH EVERY DAY Patient taking differently: Take 2.5 mg by mouth at bedtime.  10/31/17  Yes BranchAlphonse Guild, MD  loratadine (CLARITIN) 10 MG tablet Take 10 mg by mouth daily.   Yes [provider]  metoprolol succinate (TOPROL-XL) 25 MG 24 hr tablet Take 25 mg by mouth daily.    Yes [provider]  Multiple Vitamin (MULTIVITAMIN) tablet Take 1 tablet by mouth daily.   Yes [provider]  pantoprazole (PROTONIX) 40 MG tablet TAKE 1 TABLET BY MOUTH DAILY Patient taking differently: Take 40 mg by mouth daily 11/10/15  Yes Branch, Alphonse Guild, MD  ranolazine (RANEXA) 500 MG 12 hr tablet Take 500 mg by mouth 2 (two) times daily.  12/31/17  Yes [provider]  senna (SENOKOT) 8.6 MG tablet Take 3 tablets by mouth at bedtime.    Yes [provider]  vitamin B-12 (CYANOCOBALAMIN) 1000 MCG tablet Take  1,000 mcg by mouth daily.   Yes [provider]  nitroGLYCERIN (NITROSTAT) 0.4 MG SL tablet PLACE 1 TABLET (0.4 MG TOTAL) UNDER THE TONGUE EVERY 5 (FIVE) MINUTES AS NEEDED FOR CHEST PAIN. 06/29/17   Arnoldo Lenis, MD    Allergies as of 05/28/2018 - Review Complete 05/28/2018  Allergen Reaction Noted  . Vancomycin Rash 06/06/2010    Family History  Problem Relation Age of Onset  . Heart disease Mother   . Colon cancer Neg Hx   . Gastric cancer Neg Hx   . Esophageal cancer Neg Hx     Social History   Socioeconomic History  . Marital status: Married    Spouse name: Not on file  . Number of children: Not on file  . Years of education: Not on file  . Highest education level: Not on file  Occupational History  . Not on file  Social Needs  . Financial resource strain: Not on file  . Food insecurity:    Worry: Not on file    Inability: Not on file  . Transportation needs:    Medical: Not on file    Non-medical: Not on file  Tobacco Use  . Smoking status: Former Smoker    Packs/day: 1.50    Years: 22.00    Pack years: 33.00    Types: Cigarettes    Start date: 11/28/1942    Last attempt to quit: 02/07/1964    Years since quitting: 54.3  . Smokeless tobacco: Never Used  Substance and Sexual Activity  . Alcohol use: No    Alcohol/week: 0.0 standard drinks    Frequency: Never  . Drug use: No  . Sexual activity: Yes  Lifestyle  . Physical activity:    Days per week: Not on file    Minutes per session: Not on file  . Stress: Not on file  Relationships  . Social connections:    Talks on phone: Not on file    Gets together: Not on file    Attends religious service: Not on file    Active member of club or organization: Not on file    Attends meetings of clubs or organizations: Not on file    Relationship status: Not on file  . Intimate partner violence:    Fear of current or ex partner: Not on file    Emotionally abused: Not on file    Physically abused:  Not on file    Forced sexual activity: Not on file  Other Topics Concern  . Not on file  Social History Narrative  . Not on file    Review of Systems: See HPI, otherwise negative ROS   Physical Exam: BP (!) 162/80   Pulse 60   Temp 97.9 F (36.6 C) (Oral)   Resp 18   Ht 5\' 9"  (1.753 m)   Wt 69.9 kg   SpO2 99%   BMI 22.74 kg/m  General:  Alert,  pleasant and cooperative in NAD Head:  Normocephalic and atraumatic. Neck:  Supple; Lungs:  Clear throughout to auscultation.    Heart:  Regular rate and rhythm. Abdomen:  Soft, nontender and nondistended. Normal bowel sounds, without guarding, and without rebound.   Neurologic:  Alert and  oriented x4;  grossly normal neurologically.  Impression/Plan:     MELENA  PLAN: 1. EGD TODAY.  DISCUSSED PROCEDURE, BENEFITS, & RISKS: < 1% chance of medication reaction, bleeding, perforation, or ASPIRATION.

## 2018-06-03 ENCOUNTER — Encounter (HOSPITAL_COMMUNITY): Payer: Self-pay | Admitting: Emergency Medicine

## 2018-06-03 ENCOUNTER — Other Ambulatory Visit: Payer: Self-pay

## 2018-06-03 ENCOUNTER — Emergency Department (HOSPITAL_COMMUNITY): Payer: PPO

## 2018-06-03 ENCOUNTER — Inpatient Hospital Stay (HOSPITAL_COMMUNITY)
Admission: EM | Admit: 2018-06-03 | Discharge: 2018-06-07 | DRG: 286 | Disposition: A | Discharge status: Home / Self Care | Payer: PPO | Attending: Cardiovascular Disease | Admitting: Cardiovascular Disease

## 2018-06-03 DIAGNOSIS — M19041 Primary osteoarthritis, right hand: Secondary | ICD-10-CM | POA: Diagnosis not present

## 2018-06-03 DIAGNOSIS — R0789 Other chest pain: Secondary | ICD-10-CM | POA: Diagnosis present

## 2018-06-03 DIAGNOSIS — Z20828 Contact with and (suspected) exposure to other viral communicable diseases: Secondary | ICD-10-CM | POA: Diagnosis present

## 2018-06-03 DIAGNOSIS — Z79899 Other long term (current) drug therapy: Secondary | ICD-10-CM | POA: Diagnosis not present

## 2018-06-03 DIAGNOSIS — Z881 Allergy status to other antibiotic agents status: Secondary | ICD-10-CM | POA: Diagnosis not present

## 2018-06-03 DIAGNOSIS — Z955 Presence of coronary angioplasty implant and graft: Secondary | ICD-10-CM

## 2018-06-03 DIAGNOSIS — I209 Angina pectoris, unspecified: Secondary | ICD-10-CM | POA: Diagnosis not present

## 2018-06-03 DIAGNOSIS — K219 Gastro-esophageal reflux disease without esophagitis: Secondary | ICD-10-CM | POA: Diagnosis not present

## 2018-06-03 DIAGNOSIS — I2510 Atherosclerotic heart disease of native coronary artery without angina pectoris: Secondary | ICD-10-CM | POA: Diagnosis not present

## 2018-06-03 DIAGNOSIS — I10 Essential (primary) hypertension: Secondary | ICD-10-CM | POA: Diagnosis not present

## 2018-06-03 DIAGNOSIS — I480 Paroxysmal atrial fibrillation: Secondary | ICD-10-CM

## 2018-06-03 DIAGNOSIS — I200 Unstable angina: Secondary | ICD-10-CM | POA: Diagnosis not present

## 2018-06-03 DIAGNOSIS — R0602 Shortness of breath: Secondary | ICD-10-CM | POA: Diagnosis not present

## 2018-06-03 DIAGNOSIS — I25110 Atherosclerotic heart disease of native coronary artery with unstable angina pectoris: Secondary | ICD-10-CM | POA: Diagnosis not present

## 2018-06-03 DIAGNOSIS — I4892 Unspecified atrial flutter: Secondary | ICD-10-CM

## 2018-06-03 DIAGNOSIS — Z7982 Long term (current) use of aspirin: Secondary | ICD-10-CM

## 2018-06-03 DIAGNOSIS — J189 Pneumonia, unspecified organism: Secondary | ICD-10-CM | POA: Diagnosis present

## 2018-06-03 DIAGNOSIS — Z87891 Personal history of nicotine dependence: Secondary | ICD-10-CM

## 2018-06-03 DIAGNOSIS — Z87442 Personal history of urinary calculi: Secondary | ICD-10-CM

## 2018-06-03 DIAGNOSIS — I483 Typical atrial flutter: Secondary | ICD-10-CM

## 2018-06-03 DIAGNOSIS — K279 Peptic ulcer, site unspecified, unspecified as acute or chronic, without hemorrhage or perforation: Secondary | ICD-10-CM | POA: Diagnosis present

## 2018-06-03 DIAGNOSIS — K2970 Gastritis, unspecified, without bleeding: Secondary | ICD-10-CM | POA: Diagnosis not present

## 2018-06-03 DIAGNOSIS — J181 Lobar pneumonia, unspecified organism: Secondary | ICD-10-CM | POA: Diagnosis not present

## 2018-06-03 DIAGNOSIS — I484 Atypical atrial flutter: Secondary | ICD-10-CM | POA: Diagnosis not present

## 2018-06-03 DIAGNOSIS — E785 Hyperlipidemia, unspecified: Secondary | ICD-10-CM

## 2018-06-03 DIAGNOSIS — M19042 Primary osteoarthritis, left hand: Secondary | ICD-10-CM | POA: Diagnosis present

## 2018-06-03 DIAGNOSIS — I25119 Atherosclerotic heart disease of native coronary artery with unspecified angina pectoris: Secondary | ICD-10-CM | POA: Diagnosis not present

## 2018-06-03 DIAGNOSIS — Z8249 Family history of ischemic heart disease and other diseases of the circulatory system: Secondary | ICD-10-CM

## 2018-06-03 DIAGNOSIS — R059 Cough, unspecified: Secondary | ICD-10-CM

## 2018-06-03 DIAGNOSIS — R079 Chest pain, unspecified: Secondary | ICD-10-CM | POA: Diagnosis present

## 2018-06-03 DIAGNOSIS — R05 Cough: Secondary | ICD-10-CM

## 2018-06-03 DIAGNOSIS — I340 Nonrheumatic mitral (valve) insufficiency: Secondary | ICD-10-CM | POA: Diagnosis not present

## 2018-06-03 DIAGNOSIS — I4891 Unspecified atrial fibrillation: Secondary | ICD-10-CM | POA: Diagnosis not present

## 2018-06-03 LAB — CBC WITH DIFFERENTIAL/PLATELET
Abs Immature Granulocytes: 0.03 10*3/uL (ref 0.00–0.07)
Basophils Absolute: 0 10*3/uL (ref 0.0–0.1)
Basophils Relative: 0 %
Eosinophils Absolute: 0.1 10*3/uL (ref 0.0–0.5)
Eosinophils Relative: 1 %
HCT: 42.1 % (ref 39.0–52.0)
Hemoglobin: 14 g/dL (ref 13.0–17.0)
Immature Granulocytes: 0 %
Lymphocytes Relative: 30 %
Lymphs Abs: 2.1 10*3/uL (ref 0.7–4.0)
MCH: 31.4 pg (ref 26.0–34.0)
MCHC: 33.3 g/dL (ref 30.0–36.0)
MCV: 94.4 fL (ref 80.0–100.0)
Monocytes Absolute: 0.7 10*3/uL (ref 0.1–1.0)
Monocytes Relative: 9 %
Neutro Abs: 4.2 10*3/uL (ref 1.7–7.7)
Neutrophils Relative %: 60 %
Platelets: 179 10*3/uL (ref 150–400)
RBC: 4.46 MIL/uL (ref 4.22–5.81)
RDW: 13.3 % (ref 11.5–15.5)
WBC: 7.1 10*3/uL (ref 4.0–10.5)
nRBC: 0 % (ref 0.0–0.2)

## 2018-06-03 LAB — COMPREHENSIVE METABOLIC PANEL
ALT: 26 U/L (ref 0–44)
AST: 24 U/L (ref 15–41)
Albumin: 3.9 g/dL (ref 3.5–5.0)
Alkaline Phosphatase: 67 U/L (ref 38–126)
Anion gap: 9 (ref 5–15)
BUN: 12 mg/dL (ref 8–23)
CO2: 23 mmol/L (ref 22–32)
Calcium: 8.9 mg/dL (ref 8.9–10.3)
Chloride: 102 mmol/L (ref 98–111)
Creatinine, Ser: 1.19 mg/dL (ref 0.61–1.24)
GFR calc Af Amer: >60 mL/min (ref >60)
GFR calc non Af Amer: 59 mL/min — ABNORMAL LOW (ref >60)
Glucose, Bld: 157 mg/dL — ABNORMAL HIGH (ref 70–99)
Potassium: 4.1 mmol/L (ref 3.5–5.1)
Sodium: 134 mmol/L — ABNORMAL LOW (ref 135–145)
Total Bilirubin: 0.7 mg/dL (ref 0.3–1.2)
Total Protein: 7.5 g/dL (ref 6.5–8.1)

## 2018-06-03 LAB — SARS CORONAVIRUS 2 BY RT PCR (HOSPITAL ORDER, PERFORMED IN ~~LOC~~ HOSPITAL LAB): SARS Coronavirus 2: NEGATIVE

## 2018-06-03 LAB — TROPONIN I
Troponin I: <0.03 ng/mL (ref <0.03)
Troponin I: <0.03 ng/mL (ref <0.03)

## 2018-06-03 LAB — INFLUENZA PANEL BY PCR (TYPE A & B)
Influenza A By PCR: NEGATIVE
Influenza B By PCR: NEGATIVE

## 2018-06-03 LAB — PROCALCITONIN: Procalcitonin: <0.1 ng/mL

## 2018-06-03 LAB — MAGNESIUM: Magnesium: 1.8 mg/dL (ref 1.7–2.4)

## 2018-06-03 MED ORDER — LEVOFLOXACIN IN D5W 750 MG/150ML IV SOLN
750.0000 mg | INTRAVENOUS | Status: DC
  Administered 2018-06-03: 23:00:00 750 mg via INTRAVENOUS
  Filled 2018-06-03: qty 150

## 2018-06-03 MED ORDER — ONDANSETRON HCL 4 MG/2ML IJ SOLN: 4.0000 mg | Freq: Four times a day (QID) | INTRAMUSCULAR | Status: DC | PRN

## 2018-06-03 MED ORDER — METOPROLOL SUCCINATE ER 25 MG PO TB24
25.0000 mg | ORAL_TABLET | Freq: Every day | ORAL | Status: DC
  Administered 2018-06-04 – 2018-06-07 (×4): 25 mg via ORAL
  Filled 2018-06-03 (×4): qty 1

## 2018-06-03 MED ORDER — NITROGLYCERIN 0.4 MG SL SUBL: 0.4000 mg | SUBLINGUAL_TABLET | SUBLINGUAL | Status: DC | PRN

## 2018-06-03 MED ORDER — ONDANSETRON HCL 4 MG PO TABS: 4.0000 mg | ORAL_TABLET | Freq: Four times a day (QID) | ORAL | Status: DC | PRN

## 2018-06-03 MED ORDER — MAGNESIUM SULFATE 2 GM/50ML IV SOLN
2.0000 g | Freq: Once | INTRAVENOUS | Status: AC
  Administered 2018-06-03: 20:00:00 2 g via INTRAVENOUS
  Filled 2018-06-03: qty 50

## 2018-06-03 MED ORDER — RANOLAZINE ER 500 MG PO TB12
500.0000 mg | ORAL_TABLET | Freq: Two times a day (BID) | ORAL | Status: DC
  Administered 2018-06-03 – 2018-06-07 (×8): 500 mg via ORAL
  Filled 2018-06-03 (×12): qty 1

## 2018-06-03 MED ORDER — ASPIRIN EC 81 MG PO TBEC
81.0000 mg | DELAYED_RELEASE_TABLET | Freq: Every day | ORAL | Status: DC
  Administered 2018-06-04 – 2018-06-05 (×2): 81 mg via ORAL
  Filled 2018-06-03 (×2): qty 1

## 2018-06-03 MED ORDER — LISINOPRIL 2.5 MG PO TABS
2.5000 mg | ORAL_TABLET | Freq: Every day | ORAL | Status: DC
  Administered 2018-06-03 – 2018-06-06 (×4): 2.5 mg via ORAL
  Filled 2018-06-03 (×4): qty 1

## 2018-06-03 MED ORDER — ACETAMINOPHEN 325 MG PO TABS
650.0000 mg | ORAL_TABLET | Freq: Four times a day (QID) | ORAL | Status: DC | PRN
  Administered 2018-06-03 – 2018-06-05 (×4): 650 mg via ORAL
  Filled 2018-06-03 (×3): qty 2

## 2018-06-03 MED ORDER — ATORVASTATIN CALCIUM 80 MG PO TABS
80.0000 mg | ORAL_TABLET | Freq: Every day | ORAL | Status: DC
  Administered 2018-06-03 – 2018-06-06 (×4): 80 mg via ORAL
  Filled 2018-06-03 (×3): qty 1
  Filled 2018-06-03: qty 2

## 2018-06-03 MED ORDER — PANTOPRAZOLE SODIUM 40 MG PO TBEC
40.0000 mg | DELAYED_RELEASE_TABLET | Freq: Every day | ORAL | Status: DC
  Administered 2018-06-04 – 2018-06-07 (×4): 40 mg via ORAL
  Filled 2018-06-03 (×4): qty 1

## 2018-06-03 MED ORDER — ALBUTEROL SULFATE (2.5 MG/3ML) 0.083% IN NEBU: 3.0000 mL | INHALATION_SOLUTION | Freq: Four times a day (QID) | RESPIRATORY_TRACT | Status: DC | PRN

## 2018-06-03 MED ORDER — ACETAMINOPHEN 650 MG RE SUPP: 650.0000 mg | Freq: Four times a day (QID) | RECTAL | Status: DC | PRN

## 2018-06-03 NOTE — ED Provider Notes (Signed)
[facility] EMERGENCY DEPARTMENT Provider Note   CSN: [id number] Arrival date & time: 06/03/18  1330    History   Chief Complaint Chief Complaint  Patient presents with  . Shortness of Breath    HPI Eric Benitez is a 78 y.o. male.     HPI  78 y/o male - has hx of CAD s/p stenting, not on plavix anymore - told recently had PUD after endoscopy 4/24, no change in meds (has had intermittent blood in stools). He reports 3-4 weeks of weakness that is diffuse - there is no focal weakness - he is now having chest heaviness in the last 2 days and worse with exertion - and has had some cough and sob in the morning mostly - no fevers.  There is no urinary or bowel sx (other than intermittent GI bleed which he has not seen in a week).  He has been up all night with nausea and vomiting - now feeling more weak.  Past Medical History:  Diagnosis Date  . Arthritis    "neck, hands, fingers" (04/09/2014)  . Bleeds easily ([place])   . CAD (coronary artery disease)   . Hyperlipemia   . Hypertension   . Kidney stones    "I've had several; had to go in and get them once" (04/09/2014)  . Pneumonia 1940's    Patient Active Problem List   Diagnosis Date Noted  . Melena 05/28/2018  . History of colonic polyps 07/23/2017  . GERD (gastroesophageal reflux disease) 12/26/2016  . Constipation 12/26/2016  . Heme + stool 12/26/2016  . Unstable angina ([place]) 04/09/2014  . Essential hypertension 04/09/2014  . Hyperlipidemia with target LDL less than 70 04/09/2014  . Chest pain with low risk of acute coronary syndrome 04/09/2014  . Pain in the chest   . CAD S/P percutaneous coronary angioplasty - PCI to LAD in 2006 04/27/2004    Past Surgical History:  Procedure Laterality Date  . ANKLE FRACTURE SURGERY Right 2009  . CARDIAC CATHETERIZATION  1990's X 2;  04/09/2014  . COLONOSCOPY N/A 01/22/2017   Dr. Oneida Alar: 6 tubular adenomas removed, external hemorrhoids.  Recommended next exam in 3 years.  .  CORONARY ANGIOPLASTY WITH STENT PLACEMENT  2006   "2"  . CYSTOSCOPY W/ STONE MANIPULATION  ~ 2008  . ESOPHAGOGASTRODUODENOSCOPY N/A 01/22/2017   Dr. Oneida Alar: Mild to moderate gastritis.  No H. pylori  . FRACTURE SURGERY    . INTRAVASCULAR PRESSURE WIRE/FFR STUDY N/A 06/08/2017   Procedure: INTRAVASCULAR PRESSURE WIRE/FFR STUDY;  Surgeon: Nelva Bush, MD;  Location: [facility];  Service: Cardiovascular;  Laterality: N/A;  . LEFT HEART CATH AND CORONARY ANGIOGRAPHY N/A 06/08/2017   Procedure: LEFT HEART CATH AND CORONARY ANGIOGRAPHY;  Surgeon: Nelva Bush, MD;  Location: [facility];  Service: Cardiovascular;  Laterality: N/A;  . LEFT HEART CATHETERIZATION WITH CORONARY ANGIOGRAM N/A 04/09/2014   Procedure: LEFT HEART CATHETERIZATION WITH CORONARY ANGIOGRAM;  Surgeon: Leonie Man, MD;  Location: [facility] CATH LAB;  Service: Cardiovascular;  Laterality: N/A;  . NASAL SEPTUM SURGERY  1999  . POLYPECTOMY  01/22/2017   Procedure: POLYPECTOMY;  Surgeon: Danie Binder, MD;  [facility];  Service: Endoscopy;;  right colon x4        Home Medications    Prior to Admission medications   Medication Sig Start Date End Date Taking? Authorizing Provider  acetaminophen (TYLENOL) 500 MG tablet Take 1,000 mg by mouth every 6 (six) hours as needed  for moderate pain or headache.   Yes [provider]  albuterol (VENTOLIN HFA) 108 (90 Base) MCG/ACT inhaler Inhale 2 puffs into the lungs every 6 (six) hours as needed for wheezing or shortness of breath.   Yes [provider]  aspirin EC 81 MG tablet Take 81 mg by mouth daily.   Yes [provider]  atorvastatin (LIPITOR) 80 MG tablet Take 80 mg by mouth at bedtime.    Yes [provider]  diphenhydramine-acetaminophen (TYLENOL PM) 25-500 MG TABS tablet Take 2 tablets by mouth at bedtime.    Yes [provider]  doxazosin (CARDURA) 8 MG tablet Take 8 mg by mouth at bedtime.    Yes  [provider]  Fluticasone Propionate (ALLERGY RELIEF NA) Place 2 sprays into the nose daily as needed (allergies).   Yes [provider]  Hypromellose (ARTIFICIAL TEARS OP) Place 1-2 drops into both eyes daily as needed (for dry eyes).   Yes [provider]  lisinopril (PRINIVIL,ZESTRIL) 2.5 MG tablet TAKE 1 TABLET BY MOUTH EVERY DAY Patient taking differently: Take 2.5 mg by mouth at bedtime.  10/31/17  Yes BranchAlphonse Guild, MD  loratadine (CLARITIN) 10 MG tablet Take 10 mg by mouth daily.   Yes [provider]  metoprolol succinate (TOPROL-XL) 25 MG 24 hr tablet Take 25 mg by mouth daily.    Yes [provider]  nitroGLYCERIN (NITROSTAT) 0.4 MG SL tablet PLACE 1 TABLET (0.4 MG TOTAL) UNDER THE TONGUE EVERY 5 (FIVE) MINUTES AS NEEDED FOR CHEST PAIN. 06/29/17  Yes Branch, Alphonse Guild, MD  pantoprazole (PROTONIX) 40 MG tablet TAKE 1 TABLET BY MOUTH DAILY Patient taking differently: Take 40 mg by mouth daily 11/10/15  Yes Branch, Alphonse Guild, MD  ranolazine (RANEXA) 500 MG 12 hr tablet Take 500 mg by mouth 2 (two) times daily.  12/31/17  Yes [provider]  senna (SENOKOT) 8.6 MG tablet Take 3 tablets by mouth at bedtime.    Yes [provider]  vitamin B-12 (CYANOCOBALAMIN) 1000 MCG tablet Take 1,000 mcg by mouth daily.   Yes [provider]    Family History Family History  Problem Relation Age of Onset  . Heart disease Mother   . Colon cancer Neg Hx   . Gastric cancer Neg Hx   . Esophageal cancer Neg Hx     Social History Social History   Tobacco Use  . Smoking status: Former Smoker    Packs/day: 1.50    Years: 22.00    Pack years: 33.00    Types: Cigarettes    Start date: 11/28/1942    Last attempt to quit: 02/07/1964    Years since quitting: 54.3  . Smokeless tobacco: Never Used  Substance Use Topics  . Alcohol use: No    Alcohol/week: 0.0 standard drinks    Frequency: Never  . Drug use: No      Allergies   Vancomycin   Review of Systems Review of Systems  Constitutional: Negative for fever.  HENT: Positive for rhinorrhea.   Eyes: Negative for pain and redness.  Respiratory: Positive for cough ( inmorning).   Cardiovascular: Positive for chest pain ( heaviness).  Gastrointestinal: Positive for blood in stool and nausea. Negative for diarrhea and vomiting.  Genitourinary: Negative for difficulty urinating and dysuria.  Musculoskeletal: Positive for neck pain ( chronic). Negative for back pain.  Skin: Negative for rash.  Neurological: Positive for weakness ( weak all over), light-headedness and headaches. Negative for numbness.  Hematological: Negative for adenopathy. Does not bruise/bleed easily.  Psychiatric/Behavioral: Negative for sleep disturbance.     Physical Exam Updated Vital Signs BP 133/88   Pulse 88   Temp 98.2 F (36.8 C) (Oral)   Resp 14   Ht 1.753 m (5\' 9" )   Wt 73 kg   SpO2 100%   BMI 23.78 kg/m   Physical Exam Vitals signs and nursing note reviewed.  Constitutional:      General: He is not in acute distress.    Appearance: He is well-developed.  HENT:     Head: Normocephalic and atraumatic.     Mouth/Throat:     Pharynx: No oropharyngeal exudate.  Eyes:     General: No scleral icterus.       Right eye: No discharge.        Left eye: No discharge.     Conjunctiva/sclera: Conjunctivae normal.     Pupils: Pupils are equal, round, and reactive to light.  Neck:     Musculoskeletal: Normal range of motion and neck supple.     Thyroid: No thyromegaly.     Vascular: No JVD.  Cardiovascular:     Rate and Rhythm: Tachycardia present. Rhythm irregular.     Heart sounds: Normal heart sounds. No murmur. No friction rub. No gallop.      Comments: A flutter with variable block - good pulses, no murmurs Pulmonary:     Effort: Pulmonary effort is normal. No respiratory distress.     Breath sounds: Rales ( R base) present. No wheezing.  Abdominal:      General: Bowel sounds are normal. There is no distension.     Palpations: Abdomen is soft. There is no mass.     Tenderness: There is no abdominal tenderness.  Musculoskeletal: Normal range of motion.        General: No tenderness.  Lymphadenopathy:     Cervical: No cervical adenopathy.  Skin:    General: Skin is warm and dry.     Findings: No erythema or rash.  Neurological:     Mental Status: He is alert.     Coordination: Coordination normal.     Comments: Neurologic exam:  Speech clear, pupils equal round reactive to light, extraocular movements intact  Normal peripheral visual fields Cranial nerves III through XII normal including no facial droop Follows commands, moves all extremities x4, normal strength to bilateral upper and lower extremities at all major muscle groups including grip Sensation normal to light touch and pinprick Coordination intact, no limb ataxia, finger-nose-finger normal, heel shin normal bilaterally Rapid alternating movements normal No pronator drift    Psychiatric:        Behavior: Behavior normal.      ED Treatments / Results  Labs (all labs ordered are listed, but only abnormal results are displayed) Labs Reviewed  COMPREHENSIVE METABOLIC PANEL - Abnormal; Notable for the following components:      Result Value   Sodium 134 (*)    Glucose, Bld 157 (*)    GFR calc non Af Amer 59 (*)    All other components within normal limits  SARS CORONAVIRUS 2 (HOSPITAL ORDER, [place] [facility])  CBC WITH DIFFERENTIAL/PLATELET  TROPONIN I  MAGNESIUM    EKG EKG Interpretation  Date/Time:  Monday June 03 2018 13:45:15 EDT Ventricular Rate:  100 PR Interval:    QRS Duration: 79 QT Interval:  323 QTC Calculation: 417 R Axis:   37 Text Interpretation:  Atrial flutter  with variable A-V block Since last tracing Atrial flutter NOW PRESENT in place of NSR no other changes Confirmed by Noemi Chapel [phone]) on 06/03/2018 2:07:27 PM    Radiology Dg Chest Port 1 View  Result Date: 06/03/2018 CLINICAL DATA:  Cough, shortness of breath. EXAM: PORTABLE CHEST 1 VIEW COMPARISON:  Radiographs of Jun 07, 2017. FINDINGS: Stable cardiomediastinal silhouette. No pneumothorax or pleural effusion is. Both lungs are clear. The visualized skeletal structures are unremarkable. IMPRESSION: No active disease. Electronically Signed   By: Marijo Conception M.D.   On: 06/03/2018 15:25    Procedures Procedures (including critical care time)  Medications Ordered in ED Medications - No data to display   Initial Impression / Assessment and Plan / ED Course  I have reviewed the triage vital signs and the nursing notes.  Pertinent labs & imaging results that were available during my care of the patient were reviewed by me and considered in my medical decision making (see chart for details).        New arrhythmia but mostly rate controlled - chest heaviness, cough with phlegm and rales on the R with weakness suggests pna, will check CXR and labs, and for ACS, pt may need admission.  Rate control not needed, afebrile, HR as high as 115, butinto the 90's with some variability - underlying flutte rwaves seen.  Test for covid as well  covid neg, trop neg, xray without obvious infiltrate - rales at base, will d/w hospitalist.   D/w Dr. Velia Meyer - will admit  Final Clinical Impressions(s) / ED Diagnoses   Final diagnoses:  New onset atrial flutter [facility])  Chest heaviness    ED Discharge Orders    None       Noemi Chapel, MD 06/03/18 1719

## 2018-06-03 NOTE — H&P (Signed)
History and Physical    PLEASE NOTE THAT DRAGON DICTATION SOFTWARE WAS USED IN THE CONSTRUCTION OF THIS NOTE.   Eric Benitez UQJ:[id number] DOB: 1940/10/13 DOA: 06/03/2018  PCP: Caryl Bis, MD Patient coming from: home  I have personally briefly reviewed patient's old medical records in [place]  Chief Complaint: chest tightness  HPI: Eric Benitez is a 78 y.o. male with medical history significant for coronary artery disease status post PCI with stents to the LAD in 2006, hypertension, hyperlipidemia, GERD, who is admitted to [facility] on 06/03/2018 with suspected right-sided pneumonia after presenting from home to [facility] ED complaining of chest tightness.   The patient reports 2 to 3 days of non-radiating chest tightness over the bilateral, anterior aspect of the chest. He reports that the chest tightness is limited to periods of deep inspiration or cough, and notes development of a new, productive cough over that same timeframe.  He reports associated mild shortness of breath that worsens with exertion, but is not associated with any orthopnea, PND, or peripheral edema.  He also notes intermittent nausea over the last few days, resulting in a total of 2 episodes of nonbloody, nonbilious emesis.  He denies any associated palpitations, diaphoresis, dizziness, presyncope, or syncope.  He denies any preceding trauma.  He also denies any leg erythema, calf tenderness, or hemoptysis.  Denies any worsening of his chronic rhinitis/rhinorrhea, and also denies any associated sore throat  or rash. Denies any recent sick contacts, including no known or suspected COVID-19 exposures.  He notes associated subjective fever in the absence of rigors or generalized myalgias.  He reports that the above is been associated with generalized weakness and fatigue.  The patient reports that his chest heaviness does not appear to worsen with exertion and her primary symptoms, but rather the active  exerting himself appears to trigger worsening cough which in turn is associated with worsening of his chest heaviness.   Of note, in the setting of dark-colored stool experienced a few weeks ago, the patient underwent outpatient EGD via Dr. Barney Drain on 05/31/18, which reportedly revealed evidence of peptic ulcer disease in the absence of any evidence of active bleeding.  Subsequent to EGD, the patient denies any additional melena. He also denies any hematochezia or abdominal pain.   Past medical history is notable for history of coronary artery disease status post PCI with stent placement of the LAD in 2006.  He underwent follow-up cardiac catheterization in May 2019, which reportedly showed stable appearance of the coronary arteries relative to angiography in 2016, and was notable for 60% ostial D1 stenosis and mild-moderate diffuse stenosis of the RCA.  At that time, cardiology recommended continuation of medical management.    ED Course: Vital signs in the emergency department were notable for the following: Temperature max 98.2; documented heart rate varied from 80-120 bpm; blood pressure ranged from 133/88 to 158/105; respiratory rate 16-20, and oxygen saturation 100% on room air.  Per my discussions with the ED physician, patient also reportedly exhibited a brief course of atrial flutter with ventricular rates into the 170's-180's before spontaneously converting to sinus rhythm with PAC's.  Patient reportedly denied any exacerbation of his underlying symptoms during this run reported atrial flutter.  Labs performed in the ED were notable for the following: CMP notable for sodium 134, potassium 4.1, creatinine 1.19.  CBC notable for white blood cell count of 7100 with 60% neutrophils, hemoglobin 14, platelets 179.  Troponin x1-.  In-house COVID-19 test performed in the ED today was found to be negative.  Chest x-ray, per final radiology report, showed no evidence of acute cardiopulmonary process,  including no evidence of infiltrate, edema, effusion, or pneumothorax.  Subsequently, the patient was admitted to the med telemetry floor for further evaluation and management of clinical diagnosis of pneumonia.     Review of Systems: As per HPI otherwise 10 point review of systems negative.   Past Medical History:  Diagnosis Date   Arthritis    "neck, hands, fingers" (04/09/2014)   Bleeds easily (HCC)    CAD (coronary artery disease)    Hyperlipemia    Hypertension    Kidney stones    "I've had several; had to go in and get them once" (04/09/2014)   Pneumonia 1940's    Past Surgical History:  Procedure Laterality Date   ANKLE FRACTURE SURGERY Right 2009   CARDIAC CATHETERIZATION  1990's X 2;  04/09/2014   COLONOSCOPY N/A 01/22/2017   Dr. Oneida Alar: 6 tubular adenomas removed, external hemorrhoids.  Recommended next exam in 3 years.   CORONARY ANGIOPLASTY WITH STENT PLACEMENT  2006   "2"   CYSTOSCOPY W/ STONE MANIPULATION  ~ 2008   ESOPHAGOGASTRODUODENOSCOPY N/A 01/22/2017   Dr. Oneida Alar: Mild to moderate gastritis.  No H. pylori   FRACTURE SURGERY     INTRAVASCULAR PRESSURE WIRE/FFR STUDY N/A 06/08/2017   Procedure: INTRAVASCULAR PRESSURE WIRE/FFR STUDY;  Surgeon: Nelva Bush, MD;  Location: [place] [facility];  Service: Cardiovascular;  Laterality: N/A;   LEFT HEART CATH AND CORONARY ANGIOGRAPHY N/A 06/08/2017   Procedure: LEFT HEART CATH AND CORONARY ANGIOGRAPHY;  Surgeon: Nelva Bush, MD;  Location: [place] [facility];  Service: Cardiovascular;  Laterality: N/A;   LEFT HEART CATHETERIZATION WITH CORONARY ANGIOGRAM N/A 04/09/2014   Procedure: LEFT HEART CATHETERIZATION WITH CORONARY ANGIOGRAM;  Surgeon: Leonie Man, MD;  Location: [facility] C[facility] LAB;  Service: Cardiovascular;  Laterality: N/A;   NASAL SEPTUM SURGERY  1999   POLYPECTOMY  01/22/2017   Procedure: POLYPECTOMY;  Surgeon: Danie Binder, MD;  Location: [facility];  Service: Endoscopy;;  right colon x4     Social History:  reports that he quit smoking about 54 years ago. His smoking use included cigarettes. He started smoking about 75 years ago. He has a 33.00 pack-year smoking history. He has never used smokeless tobacco. He reports that he does not drink alcohol or use drugs.   Allergies  Allergen Reactions   Vancomycin Rash    Family History  Problem Relation Age of Onset   Heart disease Mother    Colon cancer Neg Hx    Gastric cancer Neg Hx    Esophageal cancer Neg Hx      Prior to Admission medications   Medication Sig Start Date End Date Taking? Authorizing Provider  acetaminophen (TYLENOL) 500 MG tablet Take 1,000 mg by mouth every 6 (six) hours as needed for moderate pain or headache.   Yes [provider]  albuterol (VENTOLIN HFA) 108 (90 Base) MCG/ACT inhaler Inhale 2 puffs into the lungs every 6 (six) hours as needed for wheezing or shortness of breath.   Yes [provider]  aspirin EC 81 MG tablet Take 81 mg by mouth daily.   Yes [provider]  atorvastatin (LIPITOR) 80 MG tablet Take 80 mg by mouth at bedtime.    Yes [provider]  diphenhydramine-acetaminophen (TYLENOL PM) 25-500 MG TABS tablet Take 2 tablets by mouth at  bedtime.    Yes [provider]  doxazosin (CARDURA) 8 MG tablet Take 8 mg by mouth at bedtime.    Yes [provider]  Fluticasone Propionate (ALLERGY RELIEF NA) Place 2 sprays into the nose daily as needed (allergies).   Yes [provider]  Hypromellose (ARTIFICIAL TEARS OP) Place 1-2 drops into both eyes daily as needed (for dry eyes).   Yes [provider]  lisinopril (PRINIVIL,ZESTRIL) 2.5 MG tablet TAKE 1 TABLET BY MOUTH EVERY DAY Patient taking differently: Take 2.5 mg by mouth at bedtime.  10/31/17  Yes BranchAlphonse Guild, MD  loratadine (CLARITIN) 10 MG tablet Take 10 mg by mouth daily.   Yes [provider]  metoprolol succinate (TOPROL-XL) 25 MG 24 hr  tablet Take 25 mg by mouth daily.    Yes [provider]  nitroGLYCERIN (NITROSTAT) 0.4 MG SL tablet PLACE 1 TABLET (0.4 MG TOTAL) UNDER THE TONGUE EVERY 5 (FIVE) MINUTES AS NEEDED FOR CHEST PAIN. 06/29/17  Yes Branch, Alphonse Guild, MD  pantoprazole (PROTONIX) 40 MG tablet TAKE 1 TABLET BY MOUTH DAILY Patient taking differently: Take 40 mg by mouth daily 11/10/15  Yes Branch, Alphonse Guild, MD  ranolazine (RANEXA) 500 MG 12 hr tablet Take 500 mg by mouth 2 (two) times daily.  12/31/17  Yes [provider]  senna (SENOKOT) 8.6 MG tablet Take 3 tablets by mouth at bedtime.    Yes [provider]  vitamin B-12 (CYANOCOBALAMIN) 1000 MCG tablet Take 1,000 mcg by mouth daily.   Yes [provider]     Objective    Physical Exam: Vitals:   06/03/18 1351 06/03/18 1400 06/03/18 1430 06/03/18 1500  BP: (!) 157/92 (!) 141/96 (!) 140/93 133/88  Pulse: (!) 117 98 91 88  Resp: '20 15 14 14  '$ Temp: 98.2 F (36.8 C)     TempSrc: Oral     SpO2: 100%     Weight: 73 kg     Height: '5\' 9"'$  (1.753 m)       General: appears to be stated age; alert, oriented Skin: warm, dry, no rash Head:  AT/[place] Mouth:  Oral mucosa membranes appear dry, normal dentition Neck: supple; trachea midline Heart:  RRR; did not appreciate any M/R/G Lungs: right basilar rales; did not appreciate wheezes or rhonchi;  Abdomen: + BS; soft, ND, NT Extremities: no peripheral edema, no muscle wasting   Labs on Admission: I have personally reviewed following labs and imaging studies  CBC: Recent Labs  Lab 05/31/18 0920 06/03/18 1344  WBC 6.6 7.1  NEUTROABS  --  4.2  HGB 12.2* 14.0  HCT 36.6* 42.1  MCV 95.3 94.4  PLT 160 161   Basic Metabolic Panel: Recent Labs  Lab 06/03/18 1344  NA 134*  K 4.1  CL 102  CO2 23  GLUCOSE 157*  BUN 12  CREATININE 1.19  CALCIUM 8.9  MG 1.8   GFR: Estimated Creatinine Clearance: 52 mL/min (by C-G formula based on SCr of 1.19 mg/dL). Liver Function  Tests: Recent Labs  Lab 06/03/18 1344  AST 24  ALT 26  ALKPHOS 67  BILITOT 0.7  PROT 7.5  ALBUMIN 3.9   No results for input(s): LIPASE, AMYLASE in the last 168 hours. No results for input(s): AMMONIA in the last 168 hours. Coagulation Profile: No results for input(s): INR, PROTIME in the last 168 hours. Cardiac Enzymes: Recent Labs  Lab 06/03/18 1344  TROPONINI <0.03   BNP (last 3 results) No results  for input(s): PROBNP in the last 8760 hours. HbA1C: No results for input(s): HGBA1C in the last 72 hours. CBG: No results for input(s): GLUCAP in the last 168 hours. Lipid Profile: No results for input(s): CHOL, HDL, LDLCALC, TRIG, CHOLHDL, LDLDIRECT in the last 72 hours. Thyroid Function Tests: No results for input(s): TSH, T4TOTAL, FREET4, T3FREE, THYROIDAB in the last 72 hours. Anemia Panel: No results for input(s): VITAMINB12, FOLATE, FERRITIN, TIBC, IRON, RETICCTPCT in the last 72 hours. Urine analysis: No results found for: COLORURINE, APPEARANCEUR, LABSPEC, [place], GLUCOSEU, HGBUR, BILIRUBINUR, KETONESUR, PROTEINUR, UROBILINOGEN, NITRITE, LEUKOCYTESUR  Radiological Exams on Admission: Dg Chest Port 1 View  Result Date: 06/03/2018 CLINICAL DATA:  Cough, shortness of breath. EXAM: PORTABLE CHEST 1 VIEW COMPARISON:  Radiographs of Jun 07, 2017. FINDINGS: Stable cardiomediastinal silhouette. No pneumothorax or pleural effusion is. Both lungs are clear. The visualized skeletal structures are unremarkable. IMPRESSION: No active disease. Electronically Signed   By: Marijo Conception M.D.   On: 06/03/2018 15:25     Assessment/Plan   Eric Benitez is a 78 y.o. male with medical history significant for coronary artery disease status post PCI with stents to the LAD in 2006, hypertension, hyperlipidemia, GERD, who is admitted to [facility] on 06/03/2018 with suspected right-sided pneumonia after presenting from home to [facility] ED complaining of chest tightness.    Principal  Problem:   RLL pneumonia ([place]) Active Problems:   Essential hypertension   Hyperlipidemia with target LDL less than 70   Chest pain   PUD (peptic ulcer disease)    #) Right lower lobe pneumonia: While presenting chest x-ray demonstrates no evidence of acute cardiopulmonary process, including no evidence of infiltrate, a clinical diagnosis of pneumonia is made via presenting new onset productive cough, shortness of breath, pleuritic chest discomfort, subjective fever, and evidence of right basilar rales on physical exam. SIRS criteria are now met for presentation to be consistent with sepsis.  Will consider this clinical diagnosis to be community-acquired in nature.  Of note, the patient denies any known or suspected COVID-19 exposures, and was found to be negative for COVID-19 per in-house testing ordered by the ED today.  Plan: We will start Levaquin, which will also provide atypical coverage given the absence of radiographic findings on presenting chest x-ray.  However, prior to initiation of Levaquin, will obtain blood cultures x2 as well as sputum culture.  Repeat CBC with differential in the morning.  Check strep pneumonia urine antigen.  PRN supplemental oxygen order to maintain oxygen saturations greater than or equal to 92%.  We will also check procalcitonin and influenza PCR.  Droplet precautions.     #) Atypical chest pain: The patient presents with 2 to 3 days of intermittent chest tightness over the bilateral aspects of the anterior chest, which appears to have a strong pleuritic component given that patient reports the chest pain to be limited to periods of deep inspiration versus cough.  Does not inherently appear to be exertional in nature, as further described above.  Of note, the patient is currently chest pain-free, and has not received any nitroglycerin.  Given the atypical nature of patient's chest tightness, negative troponin, and EKG demonstrating no evidence of T wave or ST  changes, ACS is felt to be less likely.  Rather, this atypical chest heaviness with strong pleuritic component would be consistent with clinical diagnosis of pneumonia, as further described above.  Differential also includes peptic ulcer disease, given an EGD confirmation of such on  05/31/2018.  However, given the patient's history of coronary artery disease as well as potential AV block, will trend serial troponin for formal ACS rule out.  Given the atypical nature of patient's chest discomfort and recent finding of peptic ulcer disease, will refrain from administering full dose aspirin at this time.  Rather, patient did have a baby aspirin earlier today.  Plan: Trend serial troponin.  Monitor on telemetry.  Will add on serum magnesium level labs collected in the ED.  PRN sublingual nitroglycerin for additional chest discomfort.  Continue home atorvastatin.  Work-up and management of clinical diagnosis of pneumonia, as further described above.  Could consider CTA of the chest, which would concomitantly evaluate for pulmonary embolism as well as infiltrate not identified on presenting chest x-ray.     #) Potential AV block: Potential A-V block seen on today's EKG, which would be notable given that most recent coronary angiography in May 2019 demonstrated mild-moderate diffuse stenosis of the RCA, and that ischemia involving this distribution could manifest itself in AV nodal dysfunction.   Plan: Monitor on telemetry overnight, with possibility of discussing with cardiology if high degree AV block identified.  Trend serial troponin as above.      #) Essential hypertension: On lisinopril and doxazosin as an outpatient.  Systolic blood pressures thus far of been noted to be in the 130s to 150's mmHg.   Plan: We will continue lisinopril, but will hold him doxazosin for now.  Close monitoring of ensuing vital signs.      #) Hyperlipidemia: On atorvastatin 80 mg p.o. nightly as an outpatient.  Plan:  Continue home high intensity statin.    #) Peptic ulcer disease: Identified via EGD performed on 05/31/2018 in the absence of any evidence of active bleeding.  The patient is currently on Protonix at home.  He is on a baby aspirin in the context of underlying coronary artery disease, but is otherwise on no blood thinners as an outpatient.  No evidence of active bleeding at this time.  Plan: Continue home Protonix.  Avoid NSAIDs.    DVT prophylaxis: scd's Code Status: full Family Communication: none Disposition Plan: Per Rounding Team Consults called: none  Admission status: observation; med-tele.     PLEASE NOTE THAT DRAGON DICTATION SOFTWARE WAS USED IN THE CONSTRUCTION OF THIS NOTE.   [facility] Pager [phone] From 3PM- 11PM.   Otherwise, please contact night-coverage  [email or web address] Password [facility]  06/03/2018, 5:17 PM

## 2018-06-03 NOTE — Progress Notes (Signed)
Pharmacy Antibiotic Note  Eric Benitez is a 78 y.o. male admitted on 06/03/2018 with pneumonia.  Pharmacy has been consulted for Levaquin dosing.  Plan: Start Levaquin 750mg  IV q24h Monitor renal function, cultures and patient progress.  Height: 5\' 9"  (175.3 cm) Weight: 161 lb (73 kg) IBW/kg (Calculated) : 70.7  Temp (24hrs), Avg:98.3 F (36.8 C), Min:98.2 F (36.8 C), Max:98.3 F (36.8 C)  Recent Labs  Lab 05/31/18 0920 06/03/18 1344  WBC 6.6 7.1  CREATININE  --  1.19    Estimated Creatinine Clearance: 52 mL/min (by C-G formula based on SCr of 1.19 mg/dL).    Allergies  Allergen Reactions  . Vancomycin Rash    Antimicrobials this admission: Levaquin 4/27 >>     Microbiology results: 4/27 [facility] x2:  pending  4/27 SARS-CoV-2: negative 4/27 Sputum: pending    Thank you for allowing pharmacy to be a part of this patient's care.  Despina Pole 06/03/2018 8:05 PM

## 2018-06-03 NOTE — ED Triage Notes (Signed)
Pt reports he has been short of breath for a while now, but it worsened last night and he really began feeling weak and dizzy.

## 2018-06-03 NOTE — ED Notes (Signed)
ED TO INPATIENT HANDOFF REPORT  ED Nurse Name and Phone #: Lacresha Fusilier [phone]  S Name/Age/Gender Eric Benitez 78 y.o. male Room/Bed: APA12/APA12  Code Status   Code Status: Full Code  Home/SNF/Other Home Patient oriented to: self, place, time and situation Is this baseline? Yes   Triage Complete: Triage complete  Chief Complaint DIZZINESS  Triage Note Pt reports he has been short of breath for a while now, but it worsened last night and he really began feeling weak and dizzy.     Allergies Allergies  Allergen Reactions  . Vancomycin Rash    Level of Care/Admitting Diagnosis ED Disposition    ED Disposition Condition [facility]: [facility] [[id number]]  Level of Care: Telemetry [5]  Covid Evaluation: N/A  Diagnosis: Chest pain [233612]  Admitting Physician: Rhetta Mura [[id number]]  Attending Physician: Rhetta Mura [[id number]]  PT Class (Do Not Modify): Observation [104]  PT Acc Code (Do Not Modify): Observation [10022]       B Medical/Surgery History Past Medical History:  Diagnosis Date  . Arthritis    "neck, hands, fingers" (04/09/2014)  . Bleeds easily ([place])   . CAD (coronary artery disease)   . Hyperlipemia   . Hypertension   . Kidney stones    "I've had several; had to go in and get them once" (04/09/2014)  . Pneumonia 1940's   Past Surgical History:  Procedure Laterality Date  . ANKLE FRACTURE SURGERY Right 2009  . CARDIAC CATHETERIZATION  1990's X 2;  04/09/2014  . COLONOSCOPY N/A 01/22/2017   Dr. Oneida Alar: 6 tubular adenomas removed, external hemorrhoids.  Recommended next exam in 3 years.  . CORONARY ANGIOPLASTY WITH STENT PLACEMENT  2006   "2"  . CYSTOSCOPY W/ STONE MANIPULATION  ~ 2008  . ESOPHAGOGASTRODUODENOSCOPY N/A 01/22/2017   Dr. Oneida Alar: Mild to moderate gastritis.  No H. pylori  . FRACTURE SURGERY    . INTRAVASCULAR PRESSURE WIRE/FFR STUDY N/A 06/08/2017   Procedure: INTRAVASCULAR PRESSURE WIRE/FFR  STUDY;  Surgeon: Nelva Bush, MD;  Location: [place] [facility];  Service: Cardiovascular;  Laterality: N/A;  . LEFT HEART CATH AND CORONARY ANGIOGRAPHY N/A 06/08/2017   Procedure: LEFT HEART CATH AND CORONARY ANGIOGRAPHY;  Surgeon: Nelva Bush, MD;  Location: [place] [facility];  Service: Cardiovascular;  Laterality: N/A;  . LEFT HEART CATHETERIZATION WITH CORONARY ANGIOGRAM N/A 04/09/2014   Procedure: LEFT HEART CATHETERIZATION WITH CORONARY ANGIOGRAM;  Surgeon: Leonie Man, MD;  Location: [facility] CATH LAB;  Service: Cardiovascular;  Laterality: N/A;  . NASAL SEPTUM SURGERY  1999  . POLYPECTOMY  01/22/2017   Procedure: POLYPECTOMY;  Surgeon: Danie Binder, MD;  Location: [facility];  Service: Endoscopy;;  right colon x4     A IV Location/Drains/Wounds Patient Lines/Drains/Airways Status   Active Line/Drains/Airways    Name:   Placement date:   Placement time:   Site:   Days:   Peripheral IV 06/03/18 Right Antecubital   06/03/18    1353    Antecubital   less than 1          Intake/Output Last 24 hours No intake or output data in the 24 hours ending 06/03/18 2018  Labs/Imaging Results for orders placed or performed during the hospital encounter of 06/03/18 (from the past 48 hour(s))  CBC with Differential/Platelet     Status: None   Collection Time: 06/03/18  1:44 PM  Result Value Ref Range   WBC 7.1 4.0 -  10.5 K/uL   RBC 4.46 4.22 - 5.81 MIL/uL   Hemoglobin 14.0 13.0 - 17.0 g/dL   HCT 42.1 39.0 - 52.0 %   MCV 94.4 80.0 - 100.0 fL   MCH 31.4 26.0 - 34.0 pg   MCHC 33.3 30.0 - 36.0 g/dL   RDW 13.3 11.5 - 15.5 %   Platelets 179 150 - 400 K/uL   nRBC 0.0 0.0 - 0.2 %   Neutrophils Relative % 60 %   Neutro Abs 4.2 1.7 - 7.7 K/uL   Lymphocytes Relative 30 %   Lymphs Abs 2.1 0.7 - 4.0 K/uL   Monocytes Relative 9 %   Monocytes Absolute 0.7 0.1 - 1.0 K/uL   Eosinophils Relative 1 %   Eosinophils Absolute 0.1 0.0 - 0.5 K/uL   Basophils Relative 0 %   Basophils Absolute  0.0 0.0 - 0.1 K/uL   Immature Granulocytes 0 %   Abs Immature Granulocytes 0.03 0.00 - 0.07 K/uL    Comment: Performed at [facility], [address], [place], [place] [address]  Comprehensive metabolic panel     Status: Abnormal   Collection Time: 06/03/18  1:44 PM  Result Value Ref Range   Sodium 134 (L) 135 - 145 mmol/L   Potassium 4.1 3.5 - 5.1 mmol/L   Chloride 102 98 - 111 mmol/L   CO2 23 22 - 32 mmol/L   Glucose, Bld 157 (H) 70 - 99 mg/dL   BUN 12 8 - 23 mg/dL   Creatinine, Ser 1.19 0.61 - 1.24 mg/dL   Calcium 8.9 8.9 - 10.3 mg/dL   Total Protein 7.5 6.5 - 8.1 g/dL   Albumin 3.9 3.5 - 5.0 g/dL   AST 24 15 - 41 U/L   ALT 26 0 - 44 U/L   Alkaline Phosphatase 67 38 - 126 U/L   Total Bilirubin 0.7 0.3 - 1.2 mg/dL   GFR calc non Af Amer 59 (L) >60 mL/min   GFR calc Af Amer >60 >60 mL/min   Anion gap 9 5 - 15    Comment: Performed at [facility], [address] [place], [place] [address]  Troponin I - ONCE - STAT     Status: None   Collection Time: 06/03/18  1:44 PM  Result Value Ref Range   Troponin I <0.03 <0.03 ng/mL    Comment: Performed at [facility], [address] [place], [place] [address]  Magnesium     Status: None   Collection Time: 06/03/18  1:44 PM  Result Value Ref Range   Magnesium 1.8 1.7 - 2.4 mg/dL    Comment: Performed at [facility], [address], [place], [place] [address]  SARS Coronavirus 2 [facility] order, Performed in [place] hospital lab)     Status: None   Collection Time: 06/03/18  2:23 PM  Result Value Ref Range   SARS Coronavirus 2 NEGATIVE NEGATIVE    Comment: (NOTE) If result is NEGATIVE SARS-CoV-2 target nucleic acids are NOT DETECTED. The SARS-CoV-2 RNA is generally detectable in upper and lower  respiratory specimens during the acute phase of infection. The lowest  concentration of SARS-CoV-2 viral copies this assay can detect is 250  copies / mL. A negative result does not preclude SARS-CoV-2 infection  and should not be used  as the sole basis for treatment or other  patient management decisions.  A negative result may occur with  improper specimen collection / handling, submission of specimen other  than nasopharyngeal swab, presence of  viral mutation(s) within the  areas targeted by this assay, and inadequate number of viral copies  (<250 copies / mL). A negative result must be combined with clinical  observations, patient history, and epidemiological information. If result is POSITIVE SARS-CoV-2 target nucleic acids are DETECTED. The SARS-CoV-2 RNA is generally detectable in upper and lower  respiratory specimens dur ing the acute phase of infection.  Positive  results are indicative of active infection with SARS-CoV-2.  Clinical  correlation with patient history and other diagnostic information is  necessary to determine patient infection status.  Positive results do  not rule out bacterial infection or co-infection with other viruses. If result is PRESUMPTIVE POSTIVE SARS-CoV-2 nucleic acids MAY BE PRESENT.   A presumptive positive result was obtained on the submitted specimen  and confirmed on repeat testing.  While 2019 novel coronavirus  (SARS-CoV-2) nucleic acids may be present in the submitted sample  additional confirmatory testing may be necessary for epidemiological  and / or clinical management purposes  to differentiate between  SARS-CoV-2 and other Sarbecovirus currently known to infect humans.  If clinically indicated additional testing with an alternate test  methodology [phone]) is advised. The SARS-CoV-2 RNA is generally  detectable in upper and lower respiratory sp ecimens during the acute  phase of infection. The expected result is Negative. Fact Sheet for Patients:  StrictlyIdeas.no Fact Sheet for Healthcare Providers: BankingDealers.co.za This test is not yet approved or cleared by the [place] FDA and has been authorized for  detection and/or diagnosis of SARS-CoV-2 by FDA under an Emergency Use Authorization (EUA).  This EUA will remain in effect (meaning this test can be used) for the duration of the COVID-19 declaration under Section 564(b)(1) of the Act, 21 U.S.C. section 360bbb-3(b)(1), unless the authorization is terminated or revoked sooner. Performed at [facility], [address], [place], [place] [address]    Dg Chest Port 1 View  Result Date: 06/03/2018 CLINICAL DATA:  Cough, shortness of breath. EXAM: PORTABLE CHEST 1 VIEW COMPARISON:  Radiographs of Jun 07, 2017. FINDINGS: Stable cardiomediastinal silhouette. No pneumothorax or pleural effusion is. Both lungs are clear. The visualized skeletal structures are unremarkable. IMPRESSION: No active disease. Electronically Signed   By: Marijo Conception M.D.   On: 06/03/2018 15:25    Pending Labs Unresulted Labs (From admission, onward)    Start     Ordered   06/04/18 0165  Basic metabolic panel  Tomorrow morning,   R     06/03/18 1950   06/04/18 0500  CBC with Differential/Platelet  Tomorrow morning,   R     06/03/18 1950   06/04/18 0500  Magnesium  Tomorrow morning,   R     06/03/18 1950   06/04/18 0500  TSH  Tomorrow morning,   R     06/03/18 1950   06/04/18 0500  Troponin I - Tomorrow AM 0500  Tomorrow morning,   R     06/03/18 1951   06/03/18 2300  Troponin I - Once-Timed  Once-Timed,   R     06/03/18 1951   06/03/18 2004  Expectorated sputum assessment w rflx to resp cult  Once,   R     06/03/18 2003   06/03/18 2004  Strep pneumoniae urinary antigen  Once,   R     06/03/18 2003   06/03/18 1958  Procalcitonin - Baseline  Add-on,   STAT     06/03/18 1957   06/03/18 1958  Influenza panel by PCR (  type A & B)  (Influenza PCR Panel)  Once,   R     06/03/18 1957   06/03/18 1955  Culture, blood (routine x 2)  BLOOD CULTURE X 2,   R     06/03/18 1954          Vitals/Pain Today's Vitals   06/03/18 1836 06/03/18 1838 06/03/18 1934 06/03/18  1934  BP:    (!) 144/96  Pulse:    60  Resp:    16  Temp:    98.3 F (36.8 C)  TempSrc:    Oral  SpO2:  100%  100%  Weight:      Height:      PainSc: 4   2      Isolation Precautions Droplet precaution  Medications Medications  acetaminophen (TYLENOL) tablet 650 mg (650 mg Oral Given 06/03/18 1834)    Or  acetaminophen (TYLENOL) suppository 650 mg ( Rectal See Alternative 06/03/18 1834)  ondansetron (ZOFRAN) tablet 4 mg (has no administration in time range)    Or  ondansetron (ZOFRAN) injection 4 mg (has no administration in time range)  magnesium sulfate IVPB 2 g 50 mL (has no administration in time range)  nitroGLYCERIN (NITROSTAT) SL tablet 0.4 mg (has no administration in time range)  albuterol (PROVENTIL) (2.5 MG/3ML) 0.083% nebulizer solution 3 mL (has no administration in time range)  aspirin EC tablet 81 mg (has no administration in time range)  atorvastatin (LIPITOR) tablet 80 mg (has no administration in time range)  lisinopril (ZESTRIL) tablet 2.5 mg (has no administration in time range)  metoprolol succinate (TOPROL-XL) 24 hr tablet 25 mg (has no administration in time range)  pantoprazole (PROTONIX) EC tablet 40 mg (has no administration in time range)  ranolazine (RANEXA) 12 hr tablet 500 mg (has no administration in time range)  levofloxacin (LEVAQUIN) IVPB 750 mg (has no administration in time range)    Mobility walks Low fall risk   Focused Assessments    R Recommendations: See Admitting Provider Note  Report given to:   Additional Notes:

## 2018-06-04 ENCOUNTER — Encounter (HOSPITAL_COMMUNITY): Payer: Self-pay | Admitting: Student

## 2018-06-04 ENCOUNTER — Telehealth: Payer: Self-pay | Admitting: Gastroenterology

## 2018-06-04 DIAGNOSIS — J189 Pneumonia, unspecified organism: Secondary | ICD-10-CM | POA: Diagnosis present

## 2018-06-04 DIAGNOSIS — I25119 Atherosclerotic heart disease of native coronary artery with unspecified angina pectoris: Secondary | ICD-10-CM

## 2018-06-04 DIAGNOSIS — I200 Unstable angina: Secondary | ICD-10-CM

## 2018-06-04 DIAGNOSIS — J181 Lobar pneumonia, unspecified organism: Secondary | ICD-10-CM

## 2018-06-04 DIAGNOSIS — I484 Atypical atrial flutter: Principal | ICD-10-CM

## 2018-06-04 DIAGNOSIS — I2510 Atherosclerotic heart disease of native coronary artery without angina pectoris: Secondary | ICD-10-CM

## 2018-06-04 DIAGNOSIS — K279 Peptic ulcer, site unspecified, unspecified as acute or chronic, without hemorrhage or perforation: Secondary | ICD-10-CM | POA: Diagnosis present

## 2018-06-04 DIAGNOSIS — I209 Angina pectoris, unspecified: Secondary | ICD-10-CM

## 2018-06-04 LAB — BASIC METABOLIC PANEL
Anion gap: 7 (ref 5–15)
BUN: 14 mg/dL (ref 8–23)
CO2: 23 mmol/L (ref 22–32)
Calcium: 8.7 mg/dL — ABNORMAL LOW (ref 8.9–10.3)
Chloride: 107 mmol/L (ref 98–111)
Creatinine, Ser: 0.98 mg/dL (ref 0.61–1.24)
GFR calc Af Amer: >60 mL/min (ref >60)
GFR calc non Af Amer: >60 mL/min (ref >60)
Glucose, Bld: 109 mg/dL — ABNORMAL HIGH (ref 70–99)
Potassium: 4.3 mmol/L (ref 3.5–5.1)
Sodium: 137 mmol/L (ref 135–145)

## 2018-06-04 LAB — CBC WITH DIFFERENTIAL/PLATELET
Abs Immature Granulocytes: 0.03 10*3/uL (ref 0.00–0.07)
Basophils Absolute: 0 10*3/uL (ref 0.0–0.1)
Basophils Relative: 1 %
Eosinophils Absolute: 0.1 10*3/uL (ref 0.0–0.5)
Eosinophils Relative: 2 %
HCT: 40.3 % (ref 39.0–52.0)
Hemoglobin: 13.4 g/dL (ref 13.0–17.0)
Immature Granulocytes: 0 %
Lymphocytes Relative: 26 %
Lymphs Abs: 2 10*3/uL (ref 0.7–4.0)
MCH: 31 pg (ref 26.0–34.0)
MCHC: 33.3 g/dL (ref 30.0–36.0)
MCV: 93.3 fL (ref 80.0–100.0)
Monocytes Absolute: 0.9 10*3/uL (ref 0.1–1.0)
Monocytes Relative: 11 %
Neutro Abs: 4.6 10*3/uL (ref 1.7–7.7)
Neutrophils Relative %: 60 %
Platelets: 169 10*3/uL (ref 150–400)
RBC: 4.32 MIL/uL (ref 4.22–5.81)
RDW: 13.3 % (ref 11.5–15.5)
WBC: 7.7 10*3/uL (ref 4.0–10.5)
nRBC: 0 % (ref 0.0–0.2)

## 2018-06-04 LAB — TROPONIN I: Troponin I: <0.03 ng/mL (ref <0.03)

## 2018-06-04 LAB — MAGNESIUM: Magnesium: 2.2 mg/dL (ref 1.7–2.4)

## 2018-06-04 LAB — STREP PNEUMONIAE URINARY ANTIGEN: Strep Pneumo Urinary Antigen: NEGATIVE

## 2018-06-04 LAB — PROTIME-INR
INR: 1.2 (ref 0.8–1.2)
Prothrombin Time: 14.9 seconds (ref 11.4–15.2)

## 2018-06-04 LAB — HEPARIN LEVEL (UNFRACTIONATED): Heparin Unfractionated: 0.93 IU/mL — ABNORMAL HIGH (ref 0.30–0.70)

## 2018-06-04 LAB — TSH: TSH: 2.495 u[IU]/mL (ref 0.350–4.500)

## 2018-06-04 MED ORDER — SODIUM CHLORIDE 0.9% FLUSH: 3.0000 mL | INTRAVENOUS | Status: DC | PRN

## 2018-06-04 MED ORDER — HEPARIN BOLUS VIA INFUSION
3500.0000 [IU] | Freq: Once | INTRAVENOUS | Status: AC
  Administered 2018-06-04: 3500 [IU] via INTRAVENOUS
  Filled 2018-06-04: qty 3500

## 2018-06-04 MED ORDER — SODIUM CHLORIDE 0.9 % WEIGHT BASED INFUSION
1.0000 mL/kg/h | INTRAVENOUS | Status: DC
  Administered 2018-06-05: 06:00:00 1 mL/kg/h via INTRAVENOUS

## 2018-06-04 MED ORDER — ZOLPIDEM TARTRATE 5 MG PO TABS
5.0000 mg | ORAL_TABLET | Freq: Every evening | ORAL | Status: DC | PRN
  Administered 2018-06-04 – 2018-06-07 (×4): 5 mg via ORAL
  Filled 2018-06-04 (×4): qty 1

## 2018-06-04 MED ORDER — SODIUM CHLORIDE 0.9 % IV SOLN: 250.0000 mL | INTRAVENOUS | Status: DC | PRN

## 2018-06-04 MED ORDER — ASPIRIN 81 MG PO CHEW
81.0000 mg | CHEWABLE_TABLET | ORAL | Status: AC
  Administered 2018-06-05: 06:00:00 81 mg via ORAL
  Filled 2018-06-04: qty 1

## 2018-06-04 MED ORDER — HEPARIN (PORCINE) 25000 UT/250ML-% IV SOLN
950.0000 [IU]/h | INTRAVENOUS | Status: DC
  Administered 2018-06-04: 1100 [IU]/h via INTRAVENOUS
  Administered 2018-06-05: 06:00:00 950 [IU]/h via INTRAVENOUS
  Filled 2018-06-04 (×2): qty 250

## 2018-06-04 MED ORDER — SODIUM CHLORIDE 0.9% FLUSH: 3.0000 mL | Freq: Two times a day (BID) | INTRAVENOUS | Status: DC

## 2018-06-04 MED ORDER — SODIUM CHLORIDE 0.9 % WEIGHT BASED INFUSION
3.0000 mL/kg/h | INTRAVENOUS | Status: DC
  Administered 2018-06-05: 04:00:00 3 mL/kg/h via INTRAVENOUS

## 2018-06-04 NOTE — Care Management Obs Status (Signed)
[place] NOTIFICATION   Patient Details  Name: Eric Benitez MRN: [id number] Date of Birth: Jan 21, 1941   Medicare Observation Status Notification Given:  Yes    Sherald Barge, RN 06/04/2018, 10:20 AM

## 2018-06-04 NOTE — Progress Notes (Signed)
ANTICOAGULATION CONSULT NOTE - Initial Consult  Pharmacy Consult for heparin Indication: atrial fibrillation  Allergies  Allergen Reactions  . Vancomycin Rash    Patient Measurements: Height: 5\' 9"  (175.3 cm) Weight: 161 lb 2.5 oz (73.1 kg) IBW/kg (Calculated) : 70.7 Heparin Dosing Weight: 73 kg  Vital Signs: Temp: 98.1 F (36.7 C) (04/28 0700) BP: 137/81 (04/28 0700) Pulse Rate: 88 (04/28 0700)  Labs: Recent Labs    06/03/18 1344 06/03/18 2248 06/04/18 0544  HGB 14.0  --  13.4  HCT 42.1  --  40.3  PLT 179  --  169  CREATININE 1.19  --  0.98  TROPONINI <0.03 <0.03 <0.03    Estimated Creatinine Clearance: 63.1 mL/min (by C-G formula based on SCr of 0.98 mg/dL).   Medical History: Past Medical History:  Diagnosis Date  . Arthritis    "neck, hands, fingers" (04/09/2014)  . Bleeds easily ([place])   . CAD (coronary artery disease)    a. s/p prior stenting of LAD b.  patent stent by cath in 2016 and 06/2017 --> residual 60% D1 stenosis and moderate disease along the RCA  . Hyperlipemia   . Hypertension   . Kidney stones    "I've had several; had to go in and get them once" (04/09/2014)  . Pneumonia 1940's    Medications:  Medications Prior to Admission  Medication Sig Dispense Refill Last Dose  . acetaminophen (TYLENOL) 500 MG tablet Take 1,000 mg by mouth every 6 (six) hours as needed for moderate pain or headache.   06/02/2018 at 0900  . albuterol (VENTOLIN HFA) 108 (90 Base) MCG/ACT inhaler Inhale 2 puffs into the lungs every 6 (six) hours as needed for wheezing or shortness of breath.   06/03/2018 at Unknown time  . aspirin EC 81 MG tablet Take 81 mg by mouth daily.   06/03/2018 at Unknown time  . atorvastatin (LIPITOR) 80 MG tablet Take 80 mg by mouth at bedtime.    06/02/2018 at 2100  . diphenhydramine-acetaminophen (TYLENOL PM) 25-500 MG TABS tablet Take 2 tablets by mouth at bedtime.    06/02/2018 at 2100  . doxazosin (CARDURA) 8 MG tablet Take 8 mg by mouth at  bedtime.    06/02/2018 at 2100  . Fluticasone Propionate (ALLERGY RELIEF NA) Place 2 sprays into the nose daily as needed (allergies).   06/03/2018 at 0900  . Hypromellose (ARTIFICIAL TEARS OP) Place 1-2 drops into both eyes daily as needed (for dry eyes).   Taking  . lisinopril (PRINIVIL,ZESTRIL) 2.5 MG tablet TAKE 1 TABLET BY MOUTH EVERY DAY (Patient taking differently: Take 2.5 mg by mouth at bedtime. ) 90 tablet 1 06/02/2018 at 2100  . loratadine (CLARITIN) 10 MG tablet Take 10 mg by mouth daily.   06/03/2018 at Unknown time  . metoprolol succinate (TOPROL-XL) 25 MG 24 hr tablet Take 25 mg by mouth daily.    06/03/2018 at Unknown time  . nitroGLYCERIN (NITROSTAT) 0.4 MG SL tablet PLACE 1 TABLET (0.4 MG TOTAL) UNDER THE TONGUE EVERY 5 (FIVE) MINUTES AS NEEDED FOR CHEST PAIN. 25 tablet 3 More than a month at Unknown time  . pantoprazole (PROTONIX) 40 MG tablet TAKE 1 TABLET BY MOUTH DAILY (Patient taking differently: Take 40 mg by mouth daily) 30 tablet 6 06/03/2018 at 0900  . ranolazine (RANEXA) 500 MG 12 hr tablet Take 500 mg by mouth 2 (two) times daily.    06/03/2018 at Unknown time  . senna (SENOKOT) 8.6 MG tablet Take 3 tablets by  mouth at bedtime.    06/02/2018 at Unknown time  . vitamin B-12 (CYANOCOBALAMIN) 1000 MCG tablet Take 1,000 mcg by mouth daily.   06/03/2018 at Unknown time    Assessment: Pharmacy consulted to dose heparin in patient with atrial fibrillation.  Patient was not on anticoagulation prior to admission.  Goal of Therapy:  Heparin level 0.3-0.7 units/ml Monitor platelets by anticoagulation protocol: Yes   Plan:  Give 3500 units bolus x 1 Start heparin infusion at 1100 units/hr Check anti-Xa level in 8 hours and daily while on heparin Continue to monitor H&H and platelets  Ramond Craver 06/04/2018,11:44 AM

## 2018-06-04 NOTE — Telephone Encounter (Signed)
[phone] PATIENT WIFE L/M STATING THAT PATIENT HAS NOT BEEN DOING GOOD SINCE HIS PROCEDURE AND SHE IS TAKING HIM TO [place] THIS MORNING

## 2018-06-04 NOTE — Telephone Encounter (Signed)
Noted. Pt notified.  

## 2018-06-04 NOTE — Progress Notes (Addendum)
PROGRESS NOTE  Eric Benitez FWY:[id number] DOB: Mar 14, 1940 DOA: 06/03/2018 PCP: Caryl Bis, MD  Brief History:  78 year old male with history of coronary artery disease status post PCI 2006, hypertension, hyperlipidemia, GERD presenting with 3 to 4-week history of "feeling rundown".  The patient states that he has been having chest discomfort and exertional dyspnea when he goes and works in the yard and his garden.  He states that when he goes back into the house and breasts on the couch, he has complete relief of his symptoms.  He is having some intermittent dizziness without any syncope.  He endorses compliance with all his medications.  The patient had an EGD on 05/31/2018 performed by Dr. Oneida Alar which revealed some gastritis thought secondary to his aspirin therapy.  He denies any fevers, chills, cough, hemoptysis, vomiting, diarrhea, abdominal pain, dysuria, hematuria.  He denies any hemoptysis, recent travels, sick contacts.  Because continued frequency of his symptoms, and recurrence of his symptoms while working in the garden on 06/03/2018, the patient presented emergency department for further evaluation.  In the ED, the patient was afebrile hemodynamically stable saturating 100% room air.  BMP and CBC were unremarkable.  Troponin was negative.  EKG was sinus rhythm without any ST-T wave changes.  Chest x-ray showed some mild increased interstitial markings without any change when compared to 06/07/2017.  Cardiology was consulted to assist with management.  Assessment/Plan: Typical angina/coronary artery disease -Cardiology consulted -Troponin negative x3 -05/16/2017 echo EF 60-65%, no WMA, trivial TR -Personally reviewed EKG--sinus rhythm, no ST-T wave changes -06/08/2017 heart catheterization--stable CAD since 2016.  60% ostial D1, patent LAD stent, moderate diffuse RCA disease -Continue Ranexa -Continue aspirin -discontinue levofloxacin -personally reviewed CXR--no  consolidation, no edema  Abnormal EKG -Personally reviewed EKG--appears to have sinus rhythm with nonconducted P waves -Cardiology evaluation -TSH 2.495  Essential hypertension -Continue lisinopril, metoprolol succinate  Hyperlipidemia -Continue statin -am lipid panel  Gastritis/GERD -Continue PPI  Hyperglycemia -A1C   Disposition Plan:   Home when cleared by cardiology Family Communication:  Spouse updated on phone  Consultants:  cardiology  Code Status:  FULL   DVT Prophylaxis:  [place] Lovenox   Procedures: As Listed in Progress Note Above  Antibiotics: None      Subjective: Patient denies fevers, chills, headache, chest pain, dyspnea, nausea, vomiting, diarrhea, abdominal pain, dysuria, hematuria, hematochezia, and melena.   Objective: Vitals:   06/03/18 2030 06/03/18 2227 06/03/18 2238 06/04/18 0700  BP: (!) 155/92 (!) 172/111 (!) 146/87   Pulse: (!) 119 86 75   Resp:  17    Temp:      TempSrc:      SpO2:  100%    Weight:    73.1 kg  Height:        Intake/Output Summary (Last 24 hours) at 06/04/2018 0916 Last data filed at 06/04/2018 0100 Gross per 24 hour  Intake 440 ml  Output 650 ml  Net -210 ml   Weight change:  Exam:   General:  Pt is alert, follows commands appropriately, not in acute distress  HEENT: No icterus, No thrush, No neck mass, [place]/AT  Cardiovascular: RRR, S1/S2, no rubs, no gallops  Respiratory: CTA bilaterally, no wheezing, no crackles, no rhonchi  Abdomen: Soft/+BS, non tender, non distended, no guarding  Extremities: No edema, No lymphangitis, No petechiae, No rashes, no synovitis   Data Reviewed: I have personally reviewed following labs and imaging studies Basic Metabolic Panel:  Recent Labs  Lab 06/03/18 1344 06/04/18 0544  NA 134* 137  K 4.1 4.3  CL 102 107  CO2 23 23  GLUCOSE 157* 109*  BUN 12 14  CREATININE 1.19 0.98  CALCIUM 8.9 8.7*  MG 1.8 2.2   Liver Function Tests: Recent Labs  Lab 06/03/18  1344  AST 24  ALT 26  ALKPHOS 67  BILITOT 0.7  PROT 7.5  ALBUMIN 3.9   No results for input(s): LIPASE, AMYLASE in the last 168 hours. No results for input(s): AMMONIA in the last 168 hours. Coagulation Profile: No results for input(s): INR, PROTIME in the last 168 hours. CBC: Recent Labs  Lab 05/31/18 0920 06/03/18 1344 06/04/18 0544  WBC 6.6 7.1 7.7  NEUTROABS  --  4.2 4.6  HGB 12.2* 14.0 13.4  HCT 36.6* 42.1 40.3  MCV 95.3 94.4 93.3  PLT 160 179 169   Cardiac Enzymes: Recent Labs  Lab 06/03/18 1344 06/03/18 2248 06/04/18 0544  TROPONINI <0.03 <0.03 <0.03   BNP: Invalid input(s): POCBNP CBG: No results for input(s): GLUCAP in the last 168 hours. HbA1C: No results for input(s): HGBA1C in the last 72 hours. Urine analysis: No results found for: COLORURINE, APPEARANCEUR, LABSPEC, PHURINE, GLUCOSEU, HGBUR, BILIRUBINUR, KETONESUR, PROTEINUR, UROBILINOGEN, NITRITE, LEUKOCYTESUR Sepsis Labs: @LABRCNTIP (procalcitonin:4,lacticidven:4) ) Recent Results (from the past 240 hour(s))  SARS Coronavirus 2 [facility] order, Performed in [place] hospital lab)     Status: None   Collection Time: 06/03/18  2:23 PM  Result Value Ref Range Status   SARS Coronavirus 2 NEGATIVE NEGATIVE Final    Comment: (NOTE) If result is NEGATIVE SARS-CoV-2 target nucleic acids are NOT DETECTED. The SARS-CoV-2 RNA is generally detectable in upper and lower  respiratory specimens during the acute phase of infection. The lowest  concentration of SARS-CoV-2 viral copies this assay can detect is 250  copies / mL. A negative result does not preclude SARS-CoV-2 infection  and should not be used as the sole basis for treatment or other  patient management decisions.  A negative result may occur with  improper specimen collection / handling, submission of specimen other  than nasopharyngeal swab, presence of viral mutation(s) within the  areas targeted by this assay, and inadequate number of  viral copies  (<250 copies / mL). A negative result must be combined with clinical  observations, patient history, and epidemiological information. If result is POSITIVE SARS-CoV-2 target nucleic acids are DETECTED. The SARS-CoV-2 RNA is generally detectable in upper and lower  respiratory specimens dur ing the acute phase of infection.  Positive  results are indicative of active infection with SARS-CoV-2.  Clinical  correlation with patient history and other diagnostic information is  necessary to determine patient infection status.  Positive results do  not rule out bacterial infection or co-infection with other viruses. If result is PRESUMPTIVE POSTIVE SARS-CoV-2 nucleic acids MAY BE PRESENT.   A presumptive positive result was obtained on the submitted specimen  and confirmed on repeat testing.  While 2019 novel coronavirus  (SARS-CoV-2) nucleic acids may be present in the submitted sample  additional confirmatory testing may be necessary for epidemiological  and / or clinical management purposes  to differentiate between  SARS-CoV-2 and other Sarbecovirus currently known to infect humans.  If clinically indicated additional testing with an alternate test  methodology [phone]) is advised. The SARS-CoV-2 RNA is generally  detectable in upper and lower respiratory sp ecimens during the acute  phase of infection. The expected result is Negative. Fact  Sheet for Patients:  StrictlyIdeas.no Fact Sheet for Healthcare Providers: BankingDealers.co.za This test is not yet approved or cleared by the [place] FDA and has been authorized for detection and/or diagnosis of SARS-CoV-2 by FDA under an Emergency Use Authorization (EUA).  This EUA will remain in effect (meaning this test can be used) for the duration of the COVID-19 declaration under Section 564(b)(1) of the Act, 21 U.S.C. section 360bbb-3(b)(1), unless the authorization is  terminated or revoked sooner. Performed at [facility], [address] [place], [place] [address]   Culture, blood (routine x 2)     Status: None (Preliminary result)   Collection Time: 06/03/18  8:39 PM  Result Value Ref Range Status   Specimen Description LEFT ANTECUBITAL  Final   Special Requests   Final    BOTTLES DRAWN AEROBIC AND ANAEROBIC Blood Culture adequate volume Performed at [facility], [address], [place], [place] [address]    Culture PENDING  Incomplete   Report Status PENDING  Incomplete  Culture, blood (routine x 2)     Status: None (Preliminary result)   Collection Time: 06/03/18 10:48 PM  Result Value Ref Range Status   Specimen Description BLOOD RIGHT HAND  Final   Special Requests   Final    BOTTLES DRAWN AEROBIC AND ANAEROBIC Blood Culture adequate volume Performed at [facility], [address] [place], [place] [address]    Culture PENDING  Incomplete   Report Status PENDING  Incomplete     Scheduled Meds: . aspirin EC  81 mg Oral Daily  . atorvastatin  80 mg Oral QHS  . lisinopril  2.5 mg Oral QHS  . metoprolol succinate  25 mg Oral Daily  . pantoprazole  40 mg Oral Daily  . ranolazine  500 mg Oral BID   Continuous Infusions:  Procedures/Studies: Dg Chest Port 1 View  Result Date: 06/03/2018 CLINICAL DATA:  Cough, shortness of breath. EXAM: PORTABLE CHEST 1 VIEW COMPARISON:  Radiographs of Jun 07, 2017. FINDINGS: Stable cardiomediastinal silhouette. No pneumothorax or pleural effusion is. Both lungs are clear. The visualized skeletal structures are unremarkable. IMPRESSION: No active disease. Electronically Signed   By: Marijo Conception M.D.   On: 06/03/2018 15:25    Orson Eva, DO  [facility] Pager [phone]  If 7PM-7AM, please contact night-coverage [email or web address] Password [facility] 06/04/2018, 9:16 AM   LOS: 0 days

## 2018-06-04 NOTE — Progress Notes (Signed)
Patient transferred to [facility]. Report called to [facility] on 6E. Telemetry discontinued, IV intact and running heparin at 11 mls/hr. Patient belongings sent with patient. Wife notified of transfer.

## 2018-06-04 NOTE — Consult Note (Signed)
Cardiology Consult    Patient ID: Eric Benitez; [id number]; 04-01-1940   Admit date: 06/03/2018 Date of Consult: 06/04/2018  Primary Care Provider: Caryl Bis, MD Primary Cardiologist: Carlyle Dolly, MD   Patient Profile    Eric Benitez is a 78 y.o. male with past medical history of CAD (s/p prior stenting of LAD with patent stent by cath in 2016 and 06/2017 --> residual 60% D1 stenosis and moderate disease along the RCA), HTN, HLD, GERD, and gastritis (diagnosed by EGD on 05/31/2018) who is being seen today for the evaluation of chest pain/abnormal EKG at the request of Dr. Carles Collet.  History of Present Illness    Mr. Stein was last examined by Dr. Harl Bowie in 01/2018 and denied any recent chest pain at that time. Was exercising at [employer] on the stationary bike without any anginal symptoms. Was continued on his current medication regimen and informed to follow-up in 1 year.   He presented to [facility] ED on 06/03/2018 for evaluation of worsening dyspnea and new-onset weakness and dizziness.  He has been having a recent cough, no fevers or chills however.  He states that over the last month he has had more fatigue with activity, recurring episodes of chest tightness, and within the last few days significant neck discomfort when he was working outdoors.  Denied any recent orthopnea, PND or edema.   Initial labs showed WBC 7.1, Hgb 14.0, platelets 179, Na+ 134, K+ 4.1, and creatinine 1.19. Influenza and COVID-19 testing negative. Mg 1.8. Initial and cyclic troponin values have been negative. CXR shows no active cardiopulmonary disease. EKG shows newly documented atypical atrial flutter, HR 100, with variable conduction.  He has not been aware of any sense of palpitations.  He was originally admitted through ED for work-up of possible pneumonia given cough, however in light of his reported symptoms and ECG, cardiology was consulted.  Levaquin was discontinued by the patient's  attending this AM.  Past Medical History:  Diagnosis Date   Arthritis    "neck, hands, fingers" (04/09/2014)   Bleeds easily ([place])    CAD (coronary artery disease)    a. s/p prior stenting of LAD b.  patent stent by cath in 2016 and 06/2017 --> residual 60% D1 stenosis and moderate disease along the RCA   Hyperlipemia    Hypertension    Kidney stones    "I've had several; had to go in and get them once" (04/09/2014)   Pneumonia 1940's    Past Surgical History:  Procedure Laterality Date   ANKLE FRACTURE SURGERY Right 2009   CARDIAC CATHETERIZATION  1990's X 2;  04/09/2014   COLONOSCOPY N/A 01/22/2017   Dr. Oneida Alar: 6 tubular adenomas removed, external hemorrhoids.  Recommended next exam in 3 years.   CORONARY ANGIOPLASTY WITH STENT PLACEMENT  2006   "2"   CYSTOSCOPY W/ STONE MANIPULATION  ~ 2008   ESOPHAGOGASTRODUODENOSCOPY N/A 01/22/2017   Dr. Oneida Alar: Mild to moderate gastritis.  No H. pylori   FRACTURE SURGERY     INTRAVASCULAR PRESSURE WIRE/FFR STUDY N/A 06/08/2017   Procedure: INTRAVASCULAR PRESSURE WIRE/FFR STUDY;  Surgeon: Nelva Bush, MD;  Location: [facility];  Service: Cardiovascular;  Laterality: N/A;   LEFT HEART CATH AND CORONARY ANGIOGRAPHY N/A 06/08/2017   Procedure: LEFT HEART CATH AND CORONARY ANGIOGRAPHY;  Surgeon: Nelva Bush, MD;  Location: [facility];  Service: Cardiovascular;  Laterality: N/A;   LEFT HEART CATHETERIZATION WITH CORONARY ANGIOGRAM N/A 04/09/2014  Procedure: LEFT HEART CATHETERIZATION WITH CORONARY ANGIOGRAM;  Surgeon: Leonie Man, MD;  Location: [facility] CATH LAB;  Service: Cardiovascular;  Laterality: N/A;   NASAL SEPTUM SURGERY  1999   POLYPECTOMY  01/22/2017   Procedure: POLYPECTOMY;  Surgeon: Danie Binder, MD;  Location: [facility] SUITE;  Service: Endoscopy;;  right colon x4     Home Medications:  Prior to Admission medications   Medication Sig Start Date End Date Taking? Authorizing Provider    acetaminophen (TYLENOL) 500 MG tablet Take 1,000 mg by mouth every 6 (six) hours as needed for moderate pain or headache.   Yes [provider]  albuterol (VENTOLIN HFA) 108 (90 Base) MCG/ACT inhaler Inhale 2 puffs into the lungs every 6 (six) hours as needed for wheezing or shortness of breath.   Yes [provider]  aspirin EC 81 MG tablet Take 81 mg by mouth daily.   Yes [provider]  atorvastatin (LIPITOR) 80 MG tablet Take 80 mg by mouth at bedtime.    Yes [provider]  diphenhydramine-acetaminophen (TYLENOL PM) 25-500 MG TABS tablet Take 2 tablets by mouth at bedtime.    Yes [provider]  doxazosin (CARDURA) 8 MG tablet Take 8 mg by mouth at bedtime.    Yes [provider]  Fluticasone Propionate (ALLERGY RELIEF NA) Place 2 sprays into the nose daily as needed (allergies).   Yes [provider]  Hypromellose (ARTIFICIAL TEARS OP) Place 1-2 drops into both eyes daily as needed (for dry eyes).   Yes [provider]  lisinopril (PRINIVIL,ZESTRIL) 2.5 MG tablet TAKE 1 TABLET BY MOUTH EVERY DAY Patient taking differently: Take 2.5 mg by mouth at bedtime.  10/31/17  Yes BranchAlphonse Guild, MD  loratadine (CLARITIN) 10 MG tablet Take 10 mg by mouth daily.   Yes [provider]  metoprolol succinate (TOPROL-XL) 25 MG 24 hr tablet Take 25 mg by mouth daily.    Yes [provider]  nitroGLYCERIN (NITROSTAT) 0.4 MG SL tablet PLACE 1 TABLET (0.4 MG TOTAL) UNDER THE TONGUE EVERY 5 (FIVE) MINUTES AS NEEDED FOR CHEST PAIN. 06/29/17  Yes Branch, Alphonse Guild, MD  pantoprazole (PROTONIX) 40 MG tablet TAKE 1 TABLET BY MOUTH DAILY Patient taking differently: Take 40 mg by mouth daily 11/10/15  Yes Branch, Alphonse Guild, MD  ranolazine (RANEXA) 500 MG 12 hr tablet Take 500 mg by mouth 2 (two) times daily.  12/31/17  Yes [provider]  senna (SENOKOT) 8.6 MG tablet Take 3 tablets by mouth at bedtime.    Yes  [provider]  vitamin B-12 (CYANOCOBALAMIN) 1000 MCG tablet Take 1,000 mcg by mouth daily.   Yes [provider]    Inpatient Medications: Scheduled Meds:  aspirin EC  81 mg Oral Daily   atorvastatin  80 mg Oral QHS   lisinopril  2.5 mg Oral QHS   metoprolol succinate  25 mg Oral Daily   pantoprazole  40 mg Oral Daily   ranolazine  500 mg Oral BID   Continuous Infusions:  PRN Meds: acetaminophen **OR** acetaminophen, albuterol, nitroGLYCERIN, ondansetron **OR** ondansetron (ZOFRAN) IV, zolpidem  Allergies:    Allergies  Allergen Reactions   Vancomycin Rash    Social History:   Social History   Socioeconomic History   Marital status: Married    Spouse name: Not on file   Number of children: Not on file   Years of education: Not on file   Highest education level: Not on file  Occupational History   Not on file  Social Needs   Financial resource strain: Not on file   Food insecurity:    Worry: Not on file    Inability: Not on file   Transportation needs:    Medical: Not on file    Non-medical: Not on file  Tobacco Use   Smoking status: Former Smoker    Packs/day: 1.50    Years: 22.00    Pack years: 33.00    Types: Cigarettes    Start date: 11/28/1942    Last attempt to quit: 02/07/1964    Years since quitting: 54.3   Smokeless tobacco: Never Used  Substance and Sexual Activity   Alcohol use: No    Alcohol/week: 0.0 standard drinks    Frequency: Never   Drug use: No   Sexual activity: Yes  Lifestyle   Physical activity:    Days per week: Not on file    Minutes per session: Not on file   Stress: Not on file  Relationships   Social connections:    Talks on phone: Not on file    Gets together: Not on file    Attends religious service: Not on file    Active member of club or organization: Not on file    Attends meetings of clubs or organizations: Not on file    Relationship status: Not on file   Intimate  partner violence:    Fear of current or ex partner: Not on file    Emotionally abused: Not on file    Physically abused: Not on file    Forced sexual activity: Not on file  Other Topics Concern   Not on file  Social History Narrative   Not on file     Family History:    Family History  Problem Relation Age of Onset   Heart disease Mother    Colon cancer Neg Hx    Gastric cancer Neg Hx    Esophageal cancer Neg Hx       Review of Systems    General:  No chills, fever, night sweats or weight changes.  Cardiovascular:  No edema, orthopnea, palpitations, paroxysmal nocturnal dyspnea. Positive for chest pain and dyspnea.  Dermatological: No rash, lesions/masses Respiratory: Positive for cough and dyspnea. Urologic: No hematuria, dysuria Abdominal:   No nausea, vomiting, diarrhea, bright red blood per rectum, melena, or hematemesis Neurologic:  No visual changes, wkns, changes in mental status. All other systems reviewed and are otherwise negative except as noted above.  Physical Exam/Data    Vitals:   06/03/18 2030 06/03/18 2227 06/03/18 2238 06/04/18 0700  BP: (!) 155/92 (!) 172/111 (!) 146/87 137/81  Pulse: (!) 119 86 75 88  Resp:  17  18  Temp:    98.1 F (36.7 C)  TempSrc:      SpO2:  100%  100%  Weight:    73.1 kg  Height:        Intake/Output Summary (Last 24 hours) at 06/04/2018 1136 Last data filed at 06/04/2018 0900 Gross per 24 hour  Intake 680 ml  Output 950 ml  Net -270 ml   Filed Weights   06/03/18 1351 06/04/18 0700  Weight: 73 kg 73.1 kg   Body mass index is 23.8 kg/m.   General: Elderly male, no distress.  Reported mild chest tightness this morning. Psych: Normal affect. Neuro: Alert and oriented X 3. Moves all extremities spontaneously. HEENT: Normal  Neck: Supple without bruits or JVD. Lungs:  Resp  regular and unlabored, CTA without whezing or rales. Heart: Irregularly irregular, no s3, s4, soft systolic murmurs. Abdomen: Soft,  non-tender, non-distended, BS + x 4.  Extremities: No clubbing, cyanosis or edema. DP/PT/Radials 2+ and equal bilaterally.    Labs/Studies     Relevant CV Studies:  Cardiac Catheterization: 06/2017 Conclusions: 1. Stable appearance of the coronary arteries since 2016, with 60% ostial D1 stenosis (FFR 0.90), patent mid LAD stent, and mild to moderate diffuse RCA disease. 2. Normal to hyperdynamic left ventricular contraction with low filling pressure (LVEDP 6 mmHg).  Recommendations: 1. Continue medical therapy and aggressive secondary prevention.     Echocardiogram: 05/2017 Study Conclusions  - Left ventricle: The cavity size was normal. Wall thickness was   increased in a pattern of mild LVH. Systolic function was normal.   The estimated ejection fraction was in the range of 60% to 65%.   Wall motion was normal; there were no regional wall motion   abnormalities. - Aortic valve: Mildly calcified annulus. Trileaflet. - Mitral valve: Mildly thickened leaflets . There was trivial   regurgitation. - Right atrium: Central venous pressure (est): 3 mm Hg. - Atrial septum: No defect or patent foramen ovale was identified. - Tricuspid valve: There was trivial regurgitation. - Pulmonary arteries: Systolic pressure could not be accurately   estimated. - Pericardium, extracardiac: There was no pericardial effusion.  Laboratory Data:  Chemistry Recent Labs  Lab 06/03/18 1344 06/04/18 0544  NA 134* 137  K 4.1 4.3  CL 102 107  CO2 23 23  GLUCOSE 157* 109*  BUN 12 14  CREATININE 1.19 0.98  CALCIUM 8.9 8.7*  GFRNONAA 59* >60  GFRAA >60 >60  ANIONGAP 9 7    Recent Labs  Lab 06/03/18 1344  PROT 7.5  ALBUMIN 3.9  AST 24  ALT 26  ALKPHOS 67  BILITOT 0.7   Hematology Recent Labs  Lab 05/31/18 0920 06/03/18 1344 06/04/18 0544  WBC 6.6 7.1 7.7  RBC 3.84* 4.46 4.32  HGB 12.2* 14.0 13.4  HCT 36.6* 42.1 40.3  MCV 95.3 94.4 93.3  MCH 31.8 31.4 31.0  MCHC 33.3  33.3 33.3  RDW 13.6 13.3 13.3  PLT 160 179 169   Cardiac Enzymes Recent Labs  Lab 06/03/18 1344 06/03/18 2248 06/04/18 0544  TROPONINI <0.03 <0.03 <0.03   No results for input(s): TROPIPOC in the last 168 hours.  BNPNo results for input(s): BNP, PROBNP in the last 168 hours.  DDimer No results for input(s): DDIMER in the last 168 hours.  Radiology/Studies:  Dg Chest Port 1 View  Result Date: 06/03/2018 CLINICAL DATA:  Cough, shortness of breath. EXAM: PORTABLE CHEST 1 VIEW COMPARISON:  Radiographs of Jun 07, 2017. FINDINGS: Stable cardiomediastinal silhouette. No pneumothorax or pleural effusion is. Both lungs are clear. The visualized skeletal structures are unremarkable. IMPRESSION: No active disease. Electronically Signed   By: Marijo Conception M.D.   On: 06/03/2018 15:25     Assessment & Plan    1.  Newly documented atypical atrial flutter of uncertain duration - diagnosed by EKG on admission. New diagnosis for the patient with unknown onset but possibly within the past few weeks in the setting of his progressive dyspnea, weakness, and dizziness.  - he is already on Toprol-XL 25mg  daily as an outpatient which has been continued. Can further titrate and hold Lisinopril if BP becomes soft. - This patients CHA2DS2-VASc Score and unadjusted Ischemic Stroke Rate (% per year) is equal to 4.8 % stroke rate/year  from a score of 4 (HTN, Vascular, Age (2)). Would recommend stopping ASA and starting Eliquis 5mg  BID once invasive procedures are complete. Of note, he was recently experiencing melena and diagnosed with gastritis. Denies any recurrent symptoms and Hgb stable at 13.4. Would need to follow H/H closely with initiation of anticoagulation. Will review with Dr. Domenic Polite in regards to possible DCCV but given not on anticoagulation prior to admission, would need 5 doses of Eliquis prior to TEE-guided DCCV or wait for outpatient DCCV following 3 weeks of uninterrupted anticoagulation.   2.   Worsening dyspnea on exertion and fatigue as well as angina symptoms - although reporting some more atypical chest pain with cough (chest x-ray and COVID-19 negative), he reports a one-month history of worsening shortness of breath with activity, recurring chest tightness, and significant neck discomfort while working in the yard that actually prompted his evaluation in the hospital.. - cyclic troponin values have been negative and EKG shows no acute ischemic changes.  - will discuss further ischemic evaluation with Dr. Domenic Polite.   3. CAD - s/p prior stenting of LAD with patent stent by cath in 2016 and 06/2017 --> residual 60% D1 stenosis and moderate disease along the RCA.  - would plan to stop ASA given the need for anticoagulation. Continue BB, statin, and Ranexa.   4. HLD - followed by PCP.  - remains on Atorvastatin 80mg  daily with goal LDL less than 70. ), HTN, HLD, GERD, and gastritis (diagnosed by EGD on 05/31/2018) who is being seen today for the evaluation of chest pain/abnormal EKG at the request of Dr. Carles Collet.  For questions or updates, please contact [place] Please consult [email or web address] for contact info under Cardiology/STEMI.  Signed, Bernerd Pho, PA-C 06/04/2018, 11:36 AM Pager: [phone]   Attending note:  Patient seen and examined.  I reviewed extensive records, patient follows with Dr. Harl Bowie.  I agree with documentation by Ms. Strader PA-C, I did make modifications and the physical examination documentation is my own.  Mr. Badal presents with at least a one-month history of progressive dyspnea on exertion, fatigue, and recurring chest tightness.  He specifically has not felt any sense of palpitations.  He underwent an EGD recently on April 24 by Dr. Oneida Alar for evaluation of possible melena, was found to have gastritis treated with PPI, his hemoglobin is normal at this time.  He came to the hospital at the urging of his wife due to worsening symptoms in the last  week, specifically more fatigue with exercise, worsening neck pain while working in his garden, lightheadedness.  He has had recent intermittent cough, no fevers or chills.  On examination he appears comfortable although does describe some mild chest discomfort this morning at rest.  He is afebrile.  Systolic blood pressure 175Z to 140s most recently with heart rate in the 70s to 80s in atypical atrial flutter with variable conduction by telemetry, personally reviewed.  Patient found to be SARS coronavirus 2 negative.  Additional lab work shows normal troponin I levels, potassium 4.3, BUN 14, creatinine 0.98, hemoglobin 13.4, platelets, TSH 2.49, blood cultures negative x2.  ECG shows atypical atrial flutter with variable conduction.  Chest x-ray is unremarkable.  Patient presents with symptoms potentially related to either ischemic heart disease, cardiac arrhythmia which is newly documented, or combination of the two.  No ACS by cardiac enzymes but he did have some mild chest tightness this morning in the absence of significantly elevated heart rate.  Recommendation at this time is  to have him transferred to our cardiology service at [facility].  We will start heparin and continue aspirin for now with plan for diagnostic cardiac catheterization tomorrow.  Once coronary anatomy is clarified and determination made regarding possibility of revascularization, attention can be turned to his atypical atrial flutter at which point initiation of DOAC and plan for TEE guided cardioversion could be pursued.  Alternatively, this could be deferred to an outpatient status if otherwise his heart rate control is adequate.  Would not pursue cardioversion first however, as this would defer ability to do a cardiac catheterization for least a month.  Satira Sark, M.D., F.A.C.C.

## 2018-06-04 NOTE — Telephone Encounter (Signed)
Spoke with pt. He went to [facility] yesterday and was tested for Covid-19, given an antibiotic. Pt was having some shortness of breath and states he was having these problems after his EGD. Pt is taking medications prescribed by ED doctor. Please review hospital notes from 06/03/2018.

## 2018-06-04 NOTE — Telephone Encounter (Signed)
PLEASE CALL PT'S WIFE. I REVIEWED THE RECORDS. HIS CXR IS CLEAR BUT HE IS BEING EVALUATED FOR AN IRREGULAR HEART RHYTHM. CARDIOLOGY HAS BEEN CONSULTED.

## 2018-06-04 NOTE — Progress Notes (Signed)
[place] for Heparin Indication: atrial fibrillation  Allergies  Allergen Reactions  . Vancomycin Rash    Patient Measurements: Height: 5\' 9"  (175.3 cm) Weight: 159 lb 14.4 oz (72.5 kg) IBW/kg (Calculated) : 70.7 Heparin Dosing Weight: 73 kg  Vital Signs: Temp: 98.1 F (36.7 C) (04/28 2110) Temp Source: Oral (04/28 2110) BP: 140/99 (04/28 2110) Pulse Rate: 97 (04/28 2110)  Labs: Recent Labs    06/03/18 1344 06/03/18 2248 06/04/18 0544 06/04/18 1222 06/04/18 2214  HGB 14.0  --  13.4  --   --   HCT 42.1  --  40.3  --   --   PLT 179  --  169  --   --   LABPROT  --   --   --  14.9  --   INR  --   --   --  1.2  --   HEPARINUNFRC  --   --   --   --  0.93*  CREATININE 1.19  --  0.98  --   --   TROPONINI <0.03 <0.03 <0.03  --   --     Estimated Creatinine Clearance: 63.1 mL/min (by C-G formula based on SCr of 0.98 mg/dL).   Medical History: Past Medical History:  Diagnosis Date  . Arthritis    "neck, hands, fingers" (04/09/2014)  . Bleeds easily ([place])   . CAD (coronary artery disease)    a. s/p prior stenting of LAD b.  patent stent by cath in 2016 and 06/2017 --> residual 60% D1 stenosis and moderate disease along the RCA  . Hyperlipemia   . Hypertension   . Kidney stones    "I've had several; had to go in and get them once" (04/09/2014)  . Pneumonia 1940's    Medications:  Medications Prior to Admission  Medication Sig Dispense Refill Last Dose  . acetaminophen (TYLENOL) 500 MG tablet Take 1,000 mg by mouth every 6 (six) hours as needed for moderate pain or headache.   06/02/2018 at 0900  . albuterol (VENTOLIN HFA) 108 (90 Base) MCG/ACT inhaler Inhale 2 puffs into the lungs every 6 (six) hours as needed for wheezing or shortness of breath.   06/03/2018 at Unknown time  . aspirin EC 81 MG tablet Take 81 mg by mouth daily.   06/03/2018 at Unknown time  . atorvastatin (LIPITOR) 80 MG tablet Take 80 mg by mouth at bedtime.     06/02/2018 at 2100  . diphenhydramine-acetaminophen (TYLENOL PM) 25-500 MG TABS tablet Take 2 tablets by mouth at bedtime.    06/02/2018 at 2100  . doxazosin (CARDURA) 8 MG tablet Take 8 mg by mouth at bedtime.    06/02/2018 at 2100  . Fluticasone Propionate (ALLERGY RELIEF NA) Place 2 sprays into the nose daily as needed (allergies).   06/03/2018 at 0900  . Hypromellose (ARTIFICIAL TEARS OP) Place 1-2 drops into both eyes daily as needed (for dry eyes).   Taking  . lisinopril (PRINIVIL,ZESTRIL) 2.5 MG tablet TAKE 1 TABLET BY MOUTH EVERY DAY (Patient taking differently: Take 2.5 mg by mouth at bedtime. ) 90 tablet 1 06/02/2018 at 2100  . loratadine (CLARITIN) 10 MG tablet Take 10 mg by mouth daily.   06/03/2018 at Unknown time  . metoprolol succinate (TOPROL-XL) 25 MG 24 hr tablet Take 25 mg by mouth daily.    06/03/2018 at Unknown time  . nitroGLYCERIN (NITROSTAT) 0.4 MG SL tablet PLACE 1 TABLET (0.4 MG TOTAL) UNDER THE TONGUE  EVERY 5 (FIVE) MINUTES AS NEEDED FOR CHEST PAIN. 25 tablet 3 More than a month at Unknown time  . pantoprazole (PROTONIX) 40 MG tablet TAKE 1 TABLET BY MOUTH DAILY (Patient taking differently: Take 40 mg by mouth daily) 30 tablet 6 06/03/2018 at 0900  . ranolazine (RANEXA) 500 MG 12 hr tablet Take 500 mg by mouth 2 (two) times daily.    06/03/2018 at Unknown time  . senna (SENOKOT) 8.6 MG tablet Take 3 tablets by mouth at bedtime.    06/02/2018 at Unknown time  . vitamin B-12 (CYANOCOBALAMIN) 1000 MCG tablet Take 1,000 mcg by mouth daily.   06/03/2018 at Unknown time    Assessment: Pharmacy consulted to dose heparin in patient with atrial fibrillation.  Patient was not on anticoagulation prior to admission.  06/04/2018 11:42 PM update: heparin level elevated, no issues per RN.   Goal of Therapy:  Heparin level 0.3-0.7 units/ml Monitor platelets by anticoagulation protocol: Yes   Plan:  Dec heparin to 950 units/hr Re-check heparin level in 8 hours  Narda Bonds, PharmD,  [place] Pharmacist Phone: [phone]

## 2018-06-05 ENCOUNTER — Encounter (HOSPITAL_COMMUNITY)
Admission: EM | Disposition: A | Discharge status: Home / Self Care | Payer: Self-pay | Source: Home / Self Care | Attending: Internal Medicine

## 2018-06-05 ENCOUNTER — Encounter (HOSPITAL_COMMUNITY): Payer: Self-pay | Admitting: Interventional Cardiology

## 2018-06-05 DIAGNOSIS — E785 Hyperlipidemia, unspecified: Secondary | ICD-10-CM | POA: Diagnosis present

## 2018-06-05 DIAGNOSIS — Z87891 Personal history of nicotine dependence: Secondary | ICD-10-CM | POA: Diagnosis not present

## 2018-06-05 DIAGNOSIS — I209 Angina pectoris, unspecified: Secondary | ICD-10-CM

## 2018-06-05 DIAGNOSIS — J189 Pneumonia, unspecified organism: Secondary | ICD-10-CM | POA: Diagnosis present

## 2018-06-05 DIAGNOSIS — Z7982 Long term (current) use of aspirin: Secondary | ICD-10-CM | POA: Diagnosis not present

## 2018-06-05 DIAGNOSIS — Z8249 Family history of ischemic heart disease and other diseases of the circulatory system: Secondary | ICD-10-CM | POA: Diagnosis not present

## 2018-06-05 DIAGNOSIS — I480 Paroxysmal atrial fibrillation: Secondary | ICD-10-CM

## 2018-06-05 DIAGNOSIS — I4892 Unspecified atrial flutter: Secondary | ICD-10-CM | POA: Diagnosis not present

## 2018-06-05 DIAGNOSIS — Z87442 Personal history of urinary calculi: Secondary | ICD-10-CM | POA: Diagnosis not present

## 2018-06-05 DIAGNOSIS — Z20828 Contact with and (suspected) exposure to other viral communicable diseases: Secondary | ICD-10-CM | POA: Diagnosis present

## 2018-06-05 DIAGNOSIS — I25110 Atherosclerotic heart disease of native coronary artery with unstable angina pectoris: Secondary | ICD-10-CM | POA: Diagnosis not present

## 2018-06-05 DIAGNOSIS — Z955 Presence of coronary angioplasty implant and graft: Secondary | ICD-10-CM | POA: Diagnosis not present

## 2018-06-05 DIAGNOSIS — M19041 Primary osteoarthritis, right hand: Secondary | ICD-10-CM | POA: Diagnosis present

## 2018-06-05 DIAGNOSIS — R079 Chest pain, unspecified: Secondary | ICD-10-CM | POA: Diagnosis present

## 2018-06-05 DIAGNOSIS — I484 Atypical atrial flutter: Secondary | ICD-10-CM | POA: Diagnosis present

## 2018-06-05 DIAGNOSIS — Z881 Allergy status to other antibiotic agents status: Secondary | ICD-10-CM | POA: Diagnosis not present

## 2018-06-05 DIAGNOSIS — I10 Essential (primary) hypertension: Secondary | ICD-10-CM | POA: Diagnosis present

## 2018-06-05 DIAGNOSIS — I483 Typical atrial flutter: Secondary | ICD-10-CM | POA: Diagnosis not present

## 2018-06-05 DIAGNOSIS — I25119 Atherosclerotic heart disease of native coronary artery with unspecified angina pectoris: Secondary | ICD-10-CM | POA: Diagnosis present

## 2018-06-05 DIAGNOSIS — K279 Peptic ulcer, site unspecified, unspecified as acute or chronic, without hemorrhage or perforation: Secondary | ICD-10-CM | POA: Diagnosis present

## 2018-06-05 DIAGNOSIS — I340 Nonrheumatic mitral (valve) insufficiency: Secondary | ICD-10-CM | POA: Diagnosis not present

## 2018-06-05 DIAGNOSIS — K219 Gastro-esophageal reflux disease without esophagitis: Secondary | ICD-10-CM | POA: Diagnosis present

## 2018-06-05 DIAGNOSIS — M19042 Primary osteoarthritis, left hand: Secondary | ICD-10-CM | POA: Diagnosis present

## 2018-06-05 DIAGNOSIS — R0789 Other chest pain: Secondary | ICD-10-CM | POA: Diagnosis present

## 2018-06-05 DIAGNOSIS — Z79899 Other long term (current) drug therapy: Secondary | ICD-10-CM | POA: Diagnosis not present

## 2018-06-05 DIAGNOSIS — K2970 Gastritis, unspecified, without bleeding: Secondary | ICD-10-CM | POA: Diagnosis present

## 2018-06-05 HISTORY — PX: LEFT HEART CATH AND CORONARY ANGIOGRAPHY: CATH118249

## 2018-06-05 LAB — CBC
HCT: 39 % (ref 39.0–52.0)
Hemoglobin: 13.5 g/dL (ref 13.0–17.0)
MCH: 31.8 pg (ref 26.0–34.0)
MCHC: 34.6 g/dL (ref 30.0–36.0)
MCV: 92 fL (ref 80.0–100.0)
Platelets: 167 10*3/uL (ref 150–400)
RBC: 4.24 MIL/uL (ref 4.22–5.81)
RDW: 13.3 % (ref 11.5–15.5)
WBC: 8.8 10*3/uL (ref 4.0–10.5)
nRBC: 0 % (ref 0.0–0.2)

## 2018-06-05 LAB — HEMOGLOBIN A1C
Hgb A1c MFr Bld: 5.8 % — ABNORMAL HIGH (ref 4.8–5.6)
Mean Plasma Glucose: 119.76 mg/dL

## 2018-06-05 LAB — HEPARIN LEVEL (UNFRACTIONATED)
Heparin Unfractionated: 0.99 IU/mL — ABNORMAL HIGH (ref 0.30–0.70)
Heparin Unfractionated: 0.33 IU/mL (ref 0.30–0.70)

## 2018-06-05 LAB — BASIC METABOLIC PANEL
Anion gap: 9 (ref 5–15)
BUN: 14 mg/dL (ref 8–23)
CO2: 23 mmol/L (ref 22–32)
Calcium: 9 mg/dL (ref 8.9–10.3)
Chloride: 106 mmol/L (ref 98–111)
Creatinine, Ser: 1.14 mg/dL (ref 0.61–1.24)
GFR calc Af Amer: >60 mL/min (ref >60)
GFR calc non Af Amer: >60 mL/min (ref >60)
Glucose, Bld: 112 mg/dL — ABNORMAL HIGH (ref 70–99)
Potassium: 4.2 mmol/L (ref 3.5–5.1)
Sodium: 138 mmol/L (ref 135–145)

## 2018-06-05 LAB — LIPID PANEL
Cholesterol: 123 mg/dL (ref 0–200)
HDL: 50 mg/dL (ref >40)
LDL Cholesterol: 66 mg/dL (ref 0–99)
Total CHOL/HDL Ratio: 2.5 RATIO
Triglycerides: 37 mg/dL (ref <150)
VLDL: 7 mg/dL (ref 0–40)

## 2018-06-05 SURGERY — LEFT HEART CATH AND CORONARY ANGIOGRAPHY
Anesthesia: LOCAL

## 2018-06-05 MED ORDER — HEPARIN SODIUM (PORCINE) 5000 UNIT/ML IJ SOLN: 5000.0000 [IU] | Freq: Three times a day (TID) | INTRAMUSCULAR | Status: DC

## 2018-06-05 MED ORDER — ASPIRIN 81 MG PO CHEW
81.0000 mg | CHEWABLE_TABLET | Freq: Every day | ORAL | Status: DC
  Administered 2018-06-06 – 2018-06-07 (×2): 81 mg via ORAL
  Filled 2018-06-05 (×2): qty 1

## 2018-06-05 MED ORDER — SODIUM CHLORIDE 0.9% FLUSH: 3.0000 mL | INTRAVENOUS | Status: DC | PRN

## 2018-06-05 MED ORDER — IOHEXOL 350 MG/ML SOLN
INTRAVENOUS | Status: DC | PRN
  Administered 2018-06-05: 45 mL via INTRACARDIAC

## 2018-06-05 MED ORDER — ONDANSETRON HCL 4 MG/2ML IJ SOLN: 4.0000 mg | Freq: Four times a day (QID) | INTRAMUSCULAR | Status: DC | PRN

## 2018-06-05 MED ORDER — LIDOCAINE HCL (PF) 1 % IJ SOLN
INTRAMUSCULAR | Status: DC | PRN
  Administered 2018-06-05: 2 mL via SUBCUTANEOUS

## 2018-06-05 MED ORDER — MIDAZOLAM HCL 2 MG/2ML IJ SOLN
INTRAMUSCULAR | Status: DC | PRN
  Administered 2018-06-05: 1 mg via INTRAVENOUS

## 2018-06-05 MED ORDER — LIDOCAINE HCL (PF) 1 % IJ SOLN
INTRAMUSCULAR | Status: AC
  Filled 2018-06-05: qty 30

## 2018-06-05 MED ORDER — HEPARIN (PORCINE) 25000 UT/250ML-% IV SOLN: 800.0000 [IU]/h | INTRAVENOUS | Status: DC

## 2018-06-05 MED ORDER — SODIUM CHLORIDE 0.9 % IV SOLN: 250.0000 mL | INTRAVENOUS | Status: DC | PRN

## 2018-06-05 MED ORDER — HEPARIN SODIUM (PORCINE) 1000 UNIT/ML IJ SOLN
INTRAMUSCULAR | Status: DC | PRN
  Administered 2018-06-05: 4000 [IU] via INTRAVENOUS

## 2018-06-05 MED ORDER — HEPARIN (PORCINE) IN NACL 1000-0.9 UT/500ML-% IV SOLN
INTRAVENOUS | Status: AC
  Filled 2018-06-05: qty 1000

## 2018-06-05 MED ORDER — SODIUM CHLORIDE 0.9 % IV SOLN: INTRAVENOUS | Status: AC

## 2018-06-05 MED ORDER — HEPARIN (PORCINE) 25000 UT/250ML-% IV SOLN: 850.0000 [IU]/h | INTRAVENOUS | Status: DC

## 2018-06-05 MED ORDER — LABETALOL HCL 5 MG/ML IV SOLN: 10.0000 mg | INTRAVENOUS | Status: AC | PRN

## 2018-06-05 MED ORDER — MIDAZOLAM HCL 2 MG/2ML IJ SOLN
INTRAMUSCULAR | Status: AC
  Filled 2018-06-05: qty 2

## 2018-06-05 MED ORDER — SODIUM CHLORIDE 0.9% FLUSH
3.0000 mL | Freq: Two times a day (BID) | INTRAVENOUS | Status: DC
  Administered 2018-06-05 – 2018-06-06 (×3): 3 mL via INTRAVENOUS

## 2018-06-05 MED ORDER — FENTANYL CITRATE (PF) 100 MCG/2ML IJ SOLN
INTRAMUSCULAR | Status: AC
  Filled 2018-06-05: qty 2

## 2018-06-05 MED ORDER — VERAPAMIL HCL 2.5 MG/ML IV SOLN
INTRAVENOUS | Status: DC | PRN
  Administered 2018-06-05: 11:00:00 via INTRA_ARTERIAL

## 2018-06-05 MED ORDER — HYDRALAZINE HCL 20 MG/ML IJ SOLN: 10.0000 mg | INTRAMUSCULAR | Status: AC | PRN

## 2018-06-05 MED ORDER — HEPARIN SODIUM (PORCINE) 1000 UNIT/ML IJ SOLN
INTRAMUSCULAR | Status: AC
  Filled 2018-06-05: qty 1

## 2018-06-05 MED ORDER — FENTANYL CITRATE (PF) 100 MCG/2ML IJ SOLN
INTRAMUSCULAR | Status: DC | PRN
  Administered 2018-06-05: 25 ug via INTRAVENOUS

## 2018-06-05 MED ORDER — ACETAMINOPHEN 325 MG PO TABS
650.0000 mg | ORAL_TABLET | ORAL | Status: DC | PRN
  Filled 2018-06-05: qty 2

## 2018-06-05 MED ORDER — HEPARIN (PORCINE) IN NACL 1000-0.9 UT/500ML-% IV SOLN
INTRAVENOUS | Status: DC | PRN
  Administered 2018-06-05 (×2): 500 mL

## 2018-06-05 SURGICAL SUPPLY — 10 items
CATH 5FR JL3.5 JR4 ANG PIG MP (CATHETERS) ×2 IMPLANT
DEVICE RAD COMP TR BAND LRG (VASCULAR PRODUCTS) ×2 IMPLANT
GLIDESHEATH SLEND A-KIT 6F 22G (SHEATH) ×2 IMPLANT
GUIDEWIRE INQWIRE 1.5J.035X260 (WIRE) ×1 IMPLANT
INQWIRE 1.5J .035X260CM (WIRE) ×2
KIT HEART LEFT (KITS) ×2 IMPLANT
PACK CARDIAC CATHETERIZATION (CUSTOM PROCEDURE TRAY) ×2 IMPLANT
SHEATH PROBE COVER 6X72 (BAG) ×2 IMPLANT
TRANSDUCER W/STOPCOCK (MISCELLANEOUS) ×2 IMPLANT
TUBING CIL FLEX 10 FLL-RA (TUBING) ×2 IMPLANT

## 2018-06-05 NOTE — Progress Notes (Addendum)
[place] for Heparin Indication: atrial fibrillation  Allergies  Allergen Reactions  . Vancomycin Rash    Patient Measurements: Height: 5\' 9"  (175.3 cm) Weight: 159 lb 12.8 oz (72.5 kg) IBW/kg (Calculated) : 70.7 Heparin Dosing Weight: 73 kg  Vital Signs: Temp: 97.8 F (36.6 C) (04/29 0612) Temp Source: Oral (04/29 0612) BP: 112/91 (04/29 0612) Pulse Rate: 110 (04/29 0612)  Labs: Recent Labs    06/03/18 1344 06/03/18 2248 06/04/18 0544 06/04/18 1222 06/04/18 2214 06/05/18 0539 06/05/18 0728  HGB 14.0  --  13.4  --   --  13.5  --   HCT 42.1  --  40.3  --   --  39.0  --   PLT 179  --  169  --   --  167  --   LABPROT  --   --   --  14.9  --   --   --   INR  --   --   --  1.2  --   --   --   HEPARINUNFRC  --   --   --   --  0.93*  --  0.99*  CREATININE 1.19  --  0.98  --   --  1.14  --   TROPONINI <0.03 <0.03 <0.03  --   --   --   --     Estimated Creatinine Clearance: 54.3 mL/min (by C-G formula based on SCr of 1.14 mg/dL).   Medical History: Past Medical History:  Diagnosis Date  . Arthritis    "neck, hands, fingers" (04/09/2014)  . Bleeds easily ([place])   . CAD (coronary artery disease)    a. s/p prior stenting of LAD b.  patent stent by cath in 2016 and 06/2017 --> residual 60% D1 stenosis and moderate disease along the RCA  . Hyperlipemia   . Hypertension   . Kidney stones    "I've had several; had to go in and get them once" (04/09/2014)  . Pneumonia 1940's    Medications:  Medications Prior to Admission  Medication Sig Dispense Refill Last Dose  . acetaminophen (TYLENOL) 500 MG tablet Take 1,000 mg by mouth every 6 (six) hours as needed for moderate pain or headache.   06/02/2018 at 0900  . albuterol (VENTOLIN HFA) 108 (90 Base) MCG/ACT inhaler Inhale 2 puffs into the lungs every 6 (six) hours as needed for wheezing or shortness of breath.   06/03/2018 at Unknown time  . aspirin EC 81 MG tablet Take 81 mg by mouth  daily.   06/03/2018 at Unknown time  . atorvastatin (LIPITOR) 80 MG tablet Take 80 mg by mouth at bedtime.    06/02/2018 at 2100  . diphenhydramine-acetaminophen (TYLENOL PM) 25-500 MG TABS tablet Take 2 tablets by mouth at bedtime.    06/02/2018 at 2100  . doxazosin (CARDURA) 8 MG tablet Take 8 mg by mouth at bedtime.    06/02/2018 at 2100  . Fluticasone Propionate (ALLERGY RELIEF NA) Place 2 sprays into the nose daily as needed (allergies).   06/03/2018 at 0900  . Hypromellose (ARTIFICIAL TEARS OP) Place 1-2 drops into both eyes daily as needed (for dry eyes).   Taking  . lisinopril (PRINIVIL,ZESTRIL) 2.5 MG tablet TAKE 1 TABLET BY MOUTH EVERY DAY (Patient taking differently: Take 2.5 mg by mouth at bedtime. ) 90 tablet 1 06/02/2018 at 2100  . loratadine (CLARITIN) 10 MG tablet Take 10 mg by mouth daily.   06/03/2018 at  Unknown time  . metoprolol succinate (TOPROL-XL) 25 MG 24 hr tablet Take 25 mg by mouth daily.    06/03/2018 at Unknown time  . nitroGLYCERIN (NITROSTAT) 0.4 MG SL tablet PLACE 1 TABLET (0.4 MG TOTAL) UNDER THE TONGUE EVERY 5 (FIVE) MINUTES AS NEEDED FOR CHEST PAIN. 25 tablet 3 More than a month at Unknown time  . pantoprazole (PROTONIX) 40 MG tablet TAKE 1 TABLET BY MOUTH DAILY (Patient taking differently: Take 40 mg by mouth daily) 30 tablet 6 06/03/2018 at 0900  . ranolazine (RANEXA) 500 MG 12 hr tablet Take 500 mg by mouth 2 (two) times daily.    06/03/2018 at Unknown time  . senna (SENOKOT) 8.6 MG tablet Take 3 tablets by mouth at bedtime.    06/02/2018 at Unknown time  . vitamin B-12 (CYANOCOBALAMIN) 1000 MCG tablet Take 1,000 mcg by mouth daily.   06/03/2018 at Unknown time    Assessment: 66 yoM with new AFL started on IV heparin. Initial heparin level elevated, repeat remains elevated as well. No S/Sx bleeding noted per RN, CBC stable.   Goal of Therapy:  Heparin level 0.3-0.7 units/ml Monitor platelets by anticoagulation protocol: Yes   Plan:  Hold heparin x30 min then restart  at 800 units/hr Recheck heparin level 8hr after restart   Arrie Senate, PharmD, BCPS Clinical Pharmacist [phone] Please check AMION for all [place] numbers 06/05/2018

## 2018-06-05 NOTE — Progress Notes (Signed)
[place] for Heparin Indication: atrial fibrillation  Allergies  Allergen Reactions  . Vancomycin Rash    Patient Measurements: Height: 5\' 9"  (175.3 cm) Weight: 159 lb 12.8 oz (72.5 kg) IBW/kg (Calculated) : 70.7 Heparin Dosing Weight: 73 kg  Vital Signs: Temp: 97.8 F (36.6 C) (04/29 0612) Temp Source: Oral (04/29 0612) BP: 134/78 (04/29 1057) Pulse Rate: 72 (04/29 1057)  Labs: Recent Labs    06/03/18 1344 06/03/18 2248 06/04/18 0544 06/04/18 1222 06/04/18 2214 06/05/18 0539 06/05/18 0728  HGB 14.0  --  13.4  --   --  13.5  --   HCT 42.1  --  40.3  --   --  39.0  --   PLT 179  --  169  --   --  167  --   LABPROT  --   --   --  14.9  --   --   --   INR  --   --   --  1.2  --   --   --   HEPARINUNFRC  --   --   --   --  0.93*  --  0.99*  CREATININE 1.19  --  0.98  --   --  1.14  --   TROPONINI <0.03 <0.03 <0.03  --   --   --   --     Estimated Creatinine Clearance: 54.3 mL/min (by C-G formula based on SCr of 1.14 mg/dL).   Medical History: Past Medical History:  Diagnosis Date  . Arthritis    "neck, hands, fingers" (04/09/2014)  . Bleeds easily ([place])   . CAD (coronary artery disease)    a. s/p prior stenting of LAD b.  patent stent by cath in 2016 and 06/2017 --> residual 60% D1 stenosis and moderate disease along the RCA  . Hyperlipemia   . Hypertension   . Kidney stones    "I've had several; had to go in and get them once" (04/09/2014)  . Pneumonia 1940's    Medications:  . sodium chloride    . sodium chloride    . heparin       Assessment: 88 yoM with new AFL started on IV heparin.  Heparin then held for cath lab.  Pharmacy asked to resume 4 hrs after sheath out.  Sheath removed ~ 11 AM.  No S/Sx bleeding noted per RN, CBC stable.   Goal of Therapy:  Heparin level 0.3-0.7 units/ml Monitor platelets by anticoagulation protocol: Yes   Plan:  Restart heparin gtt at 800 units/hr at 3 pm. Check heparin  level 8 hrs after gtt restarts Daily heparin level and CBC. F/u plans for conversion to oral anticoagulation.  Marguerite Olea, [facility] Clinical Pharmacist Phone [phone]  06/05/2018 1:10 PM

## 2018-06-05 NOTE — Plan of Care (Signed)
  Problem: Health Behavior/Discharge Planning: Goal: Ability to manage health-related needs will improve Outcome: Progressing   Problem: Education: Goal: Knowledge of General Education information will improve Description: Including pain rating scale, medication(s)/side effects and non-pharmacologic comfort measures Outcome: Progressing   

## 2018-06-05 NOTE — H&P (View-Only) (Signed)
Progress Note  Patient Name: Eric Benitez Date of Encounter: 06/05/2018  Primary Cardiologist: Carlyle Dolly, MD   Subjective   The patient continues to complain of some pressure in his chest.  He has no shortness of breath at rest.  He is short of breath with low-level activity.  Inpatient Medications    Scheduled Meds: . aspirin EC  81 mg Oral Daily  . atorvastatin  80 mg Oral QHS  . lisinopril  2.5 mg Oral QHS  . metoprolol succinate  25 mg Oral Daily  . pantoprazole  40 mg Oral Daily  . ranolazine  500 mg Oral BID  . sodium chloride flush  3 mL Intravenous Q12H   Continuous Infusions: . sodium chloride    . sodium chloride 1 mL/kg/hr (06/05/18 0554)  . heparin 950 Units/hr (06/05/18 0600)   PRN Meds: sodium chloride, acetaminophen **OR** acetaminophen, albuterol, nitroGLYCERIN, ondansetron **OR** ondansetron (ZOFRAN) IV, sodium chloride flush, zolpidem   Vital Signs    Vitals:   06/04/18 0700 06/04/18 1626 06/04/18 2110 06/05/18 0612  BP: 137/81 (!) 154/99 (!) 140/99 (!) 112/91  Pulse: 88 (!) 111 97 (!) 110  Resp: 18 16    Temp: 98.1 F (36.7 C) 98.5 F (36.9 C) 98.1 F (36.7 C) 97.8 F (36.6 C)  TempSrc:  Oral Oral Oral  SpO2: 100% 97% 99% 99%  Weight: 73.1 kg 72.5 kg  72.5 kg  Height:  5\' 9"  (1.753 m)      Intake/Output Summary (Last 24 hours) at 06/05/2018 0705 Last data filed at 06/05/2018 0610 Gross per 24 hour  Intake 368.14 ml  Output 1550 ml  Net -1181.86 ml   Last 3 Weights 06/05/2018 06/04/2018 06/04/2018  Weight (lbs) 159 lb 12.8 oz 159 lb 14.4 oz 161 lb 2.5 oz  Weight (kg) 72.485 kg 72.53 kg 73.1 kg      Telemetry    Atrial flutter with variable conduction, heart rate 95 to 105 bpm - Personally Reviewed  ECG    Aflutter with variable block - Personally Reviewed  Physical Exam   Physical Exam per MD. GEN: No acute distress.   Neck: No JVD Cardiac:  Irregularly irregular no murmur or gallop Respiratory: Clear to auscultation  bilaterally. GI: Soft, nontender, non-distended  MS: No edema; No deformity. Neuro:  Nonfocal  Psych: Normal affect   Labs    Chemistry Recent Labs  Lab 06/03/18 1344 06/04/18 0544 06/05/18 0539  NA 134* 137 138  K 4.1 4.3 4.2  CL 102 107 106  CO2 23 23 23   GLUCOSE 157* 109* 112*  BUN 12 14 14   CREATININE 1.19 0.98 1.14  CALCIUM 8.9 8.7* 9.0  PROT 7.5  --   --   ALBUMIN 3.9  --   --   AST 24  --   --   ALT 26  --   --   ALKPHOS 67  --   --   BILITOT 0.7  --   --   GFRNONAA 59* >60 >60  GFRAA >60 >60 >60  ANIONGAP 9 7 9      Hematology Recent Labs  Lab 06/03/18 1344 06/04/18 0544 06/05/18 0539  WBC 7.1 7.7 8.8  RBC 4.46 4.32 4.24  HGB 14.0 13.4 13.5  HCT 42.1 40.3 39.0  MCV 94.4 93.3 92.0  MCH 31.4 31.0 31.8  MCHC 33.3 33.3 34.6  RDW 13.3 13.3 13.3  PLT 179 169 167    Cardiac Enzymes Recent Labs  Lab 06/03/18 1344 06/03/18  2248 06/04/18 0544  TROPONINI <0.03 <0.03 <0.03   No results for input(s): TROPIPOC in the last 168 hours.   BNPNo results for input(s): BNP, PROBNP in the last 168 hours.   DDimer No results for input(s): DDIMER in the last 168 hours.   Radiology    Dg Chest Port 1 View  Result Date: 06/03/2018 CLINICAL DATA:  Cough, shortness of breath. EXAM: PORTABLE CHEST 1 VIEW COMPARISON:  Radiographs of Jun 07, 2017. FINDINGS: Stable cardiomediastinal silhouette. No pneumothorax or pleural effusion is. Both lungs are clear. The visualized skeletal structures are unremarkable. IMPRESSION: No active disease. Electronically Signed   By: Marijo Conception M.D.   On: 06/03/2018 15:25    Cardiac Studies   N/a   Patient Profile     78 y.o. male with PMH of CAD (s/p prior stenting of LAD with patent stent by cath in 2016 and 06/2017 --> residual 60% D1 stenosis and moderate disease along the RCA), HTN, HLD, GERD, and gastritis (diagnosed by EGD on 05/31/2018) who was seen for the evaluation of chest pain/abnormal EKG while at [facility] and transferred  to [facility].   Assessment & Plan    1.  Newly documented atypical atrial flutter of uncertain duration: found on admission. Suspected to have started several weeks ago. Remains on BB therapy.  - CHA2DS2-VASc Score of 4 (HTN, Vascular, Age (2)). Placed on IV heparin pending his ischemic work up.  - could consider possible TEE/DCCV while inpatient or defer to outpatient setting. Further recommendations pending cardiac cath.  2.  Worsening dyspnea on exertion and fatigue as well as angina symptoms - although reporting some more atypical chest pain with cough (chest x-ray and COVID-19 negative). - cyclic troponin values have been negative and EKG shows no acute ischemic changes.  - planned for cardiac cath today  3. CAD - s/p prior stenting of LAD with patent stent by cath in 2016 and 06/2017 --> residual 60% D1 stenosis and moderate disease along the RCA.  - on ASA  4. HLD - followed by PCP.  - remains on Atorvastatin 80mg  daily with goal LDL less than 70.  For questions or updates, please contact [place] Please consult [email or web address] for contact info under     Signed, Reino Bellis, NP  06/05/2018, 7:05 AM    Patient seen, examined. Available data reviewed. Agree with findings, assessment, and plan as outlined by Reino Bellis, NP.  The physical exam findings outlined above reflect my personal findings from this morning's examination.  The patient has newly diagnosed atrial flutter with variable block and modestly elevated heart rate.  He describes New York Heart Association functional class III limitation from both shortness of breath and angina.  Plans for cardiac catheterization today to evaluate for obstructive CAD.  The patient currently is on IV heparin.  His cardiac catheterization from 1 year ago is reviewed when he underwent pressure wire analysis of moderate stenosis at the ostium of the diagonal branch.  His LAD stent was patent at that time.  As long as his CAD is stable,  will focus treatment on the patient's atrial flutter.  Will consider TEE cardioversion versus flutter ablation.  We will plan to make an attempt at restoring sinus rhythm during his hospitalization since he is highly symptomatic.  Sherren Mocha, M.D. 06/05/2018 9:12 AM

## 2018-06-05 NOTE — Interval H&P Note (Signed)
Cath Lab Visit (complete for each Cath Lab visit)  Clinical Evaluation Leading to the Procedure:   ACS: No.  Non-ACS:    Anginal Classification: CCS III  Anti-ischemic medical therapy: Minimal Therapy (1 class of medications)  Non-Invasive Test Results: No non-invasive testing performed  Prior CABG: No previous CABG      History and Physical Interval Note:  06/05/2018 10:33 AM  Eric Benitez  has presented today for surgery, with the diagnosis of unstable angina.  The various methods of treatment have been discussed with the patient and family. After consideration of risks, benefits and other options for treatment, the patient has consented to  Procedure(s): LEFT HEART CATH AND CORONARY ANGIOGRAPHY (N/A) as a surgical intervention.  The patient's history has been reviewed, patient examined, no change in status, stable for surgery.  I have reviewed the patient's chart and labs.  Questions were answered to the patient's satisfaction.     Belva Crome III

## 2018-06-05 NOTE — CV Procedure (Signed)
   Diagnostic coronary angiography via right radial using ultrasound guidance for access.  Coronary anatomy unchanged from most recent angiogram in 2019.  Coronaries, including LAD stent, are widely patent  No complications.

## 2018-06-05 NOTE — Progress Notes (Signed)
[place] for Heparin Indication: atrial fibrillation  Allergies  Allergen Reactions  . Vancomycin Rash    Patient Measurements: Height: 5\' 9"  (175.3 cm) Weight: 159 lb 12.8 oz (72.5 kg) IBW/kg (Calculated) : 70.7 Heparin Dosing Weight: 73 kg  Vital Signs: Temp: 97.9 F (36.6 C) (04/29 2113) Temp Source: Oral (04/29 2113) BP: 127/86 (04/29 2113) Pulse Rate: 69 (04/29 2113)  Labs: Recent Labs    06/03/18 1344 06/03/18 2248 06/04/18 0544 06/04/18 1222 06/04/18 2214 06/05/18 0539 06/05/18 0728 06/05/18 2253  HGB 14.0  --  13.4  --   --  13.5  --   --   HCT 42.1  --  40.3  --   --  39.0  --   --   PLT 179  --  169  --   --  167  --   --   LABPROT  --   --   --  14.9  --   --   --   --   INR  --   --   --  1.2  --   --   --   --   HEPARINUNFRC  --   --   --   --  0.93*  --  0.99* 0.33  CREATININE 1.19  --  0.98  --   --  1.14  --   --   TROPONINI <0.03 <0.03 <0.03  --   --   --   --   --     Estimated Creatinine Clearance: 54.3 mL/min (by C-G formula based on SCr of 1.14 mg/dL).   Medical History: Past Medical History:  Diagnosis Date  . Arthritis    "neck, hands, fingers" (04/09/2014)  . Bleeds easily ([place])   . CAD (coronary artery disease)    a. s/p prior stenting of LAD b.  patent stent by cath in 2016 and 06/2017 --> residual 60% D1 stenosis and moderate disease along the RCA  . Hyperlipemia   . Hypertension   . Kidney stones    "I've had several; had to go in and get them once" (04/09/2014)  . Pneumonia 1940's    Medications:  . sodium chloride    . heparin 800 Units/hr (06/05/18 1604)     Assessment: 35 yoM with new AFL started on IV heparin.  Heparin then held for cath lab.  Pharmacy asked to resume 4 hrs after sheath out.  Sheath removed ~ 11 AM.  Heparin level lower end of therapeutic at 0.33 s/p restart   Goal of Therapy:  Heparin level 0.3-0.7 units/ml Monitor platelets by anticoagulation protocol:  Yes   Plan:  Increase heparin gtt to 850 units/hr F/u AM labs to confirm  Bertis Ruddy, PharmD Clinical Pharmacist Please check AMION for all [place] numbers 06/05/2018 11:52 PM

## 2018-06-05 NOTE — Progress Notes (Signed)
Progress Note  Patient Name: Eric Benitez Date of Encounter: 06/05/2018  Primary Cardiologist: Carlyle Dolly, MD   Subjective   The patient continues to complain of some pressure in his chest.  He has no shortness of breath at rest.  He is short of breath with low-level activity.  Inpatient Medications    Scheduled Meds: . aspirin EC  81 mg Oral Daily  . atorvastatin  80 mg Oral QHS  . lisinopril  2.5 mg Oral QHS  . metoprolol succinate  25 mg Oral Daily  . pantoprazole  40 mg Oral Daily  . ranolazine  500 mg Oral BID  . sodium chloride flush  3 mL Intravenous Q12H   Continuous Infusions: . sodium chloride    . sodium chloride 1 mL/kg/hr (06/05/18 0554)  . heparin 950 Units/hr (06/05/18 0600)   PRN Meds: sodium chloride, acetaminophen **OR** acetaminophen, albuterol, nitroGLYCERIN, ondansetron **OR** ondansetron (ZOFRAN) IV, sodium chloride flush, zolpidem   Vital Signs    Vitals:   06/04/18 0700 06/04/18 1626 06/04/18 2110 06/05/18 0612  BP: 137/81 (!) 154/99 (!) 140/99 (!) 112/91  Pulse: 88 (!) 111 97 (!) 110  Resp: 18 16    Temp: 98.1 F (36.7 C) 98.5 F (36.9 C) 98.1 F (36.7 C) 97.8 F (36.6 C)  TempSrc:  Oral Oral Oral  SpO2: 100% 97% 99% 99%  Weight: 73.1 kg 72.5 kg  72.5 kg  Height:  5\' 9"  (1.753 m)      Intake/Output Summary (Last 24 hours) at 06/05/2018 0705 Last data filed at 06/05/2018 0610 Gross per 24 hour  Intake 368.14 ml  Output 1550 ml  Net -1181.86 ml   Last 3 Weights 06/05/2018 06/04/2018 06/04/2018  Weight (lbs) 159 lb 12.8 oz 159 lb 14.4 oz 161 lb 2.5 oz  Weight (kg) 72.485 kg 72.53 kg 73.1 kg      Telemetry    Atrial flutter with variable conduction, heart rate 95 to 105 bpm - Personally Reviewed  ECG    Aflutter with variable block - Personally Reviewed  Physical Exam   Physical Exam per MD. GEN: No acute distress.   Neck: No JVD Cardiac:  Irregularly irregular no murmur or gallop Respiratory: Clear to auscultation  bilaterally. GI: Soft, nontender, non-distended  MS: No edema; No deformity. Neuro:  Nonfocal  Psych: Normal affect   Labs    Chemistry Recent Labs  Lab 06/03/18 1344 06/04/18 0544 06/05/18 0539  NA 134* 137 138  K 4.1 4.3 4.2  CL 102 107 106  CO2 23 23 23   GLUCOSE 157* 109* 112*  BUN 12 14 14   CREATININE 1.19 0.98 1.14  CALCIUM 8.9 8.7* 9.0  PROT 7.5  --   --   ALBUMIN 3.9  --   --   AST 24  --   --   ALT 26  --   --   ALKPHOS 67  --   --   BILITOT 0.7  --   --   GFRNONAA 59* >60 >60  GFRAA >60 >60 >60  ANIONGAP 9 7 9      Hematology Recent Labs  Lab 06/03/18 1344 06/04/18 0544 06/05/18 0539  WBC 7.1 7.7 8.8  RBC 4.46 4.32 4.24  HGB 14.0 13.4 13.5  HCT 42.1 40.3 39.0  MCV 94.4 93.3 92.0  MCH 31.4 31.0 31.8  MCHC 33.3 33.3 34.6  RDW 13.3 13.3 13.3  PLT 179 169 167    Cardiac Enzymes Recent Labs  Lab 06/03/18 1344 06/03/18  2248 06/04/18 0544  TROPONINI <0.03 <0.03 <0.03   No results for input(s): TROPIPOC in the last 168 hours.   BNPNo results for input(s): BNP, PROBNP in the last 168 hours.   DDimer No results for input(s): DDIMER in the last 168 hours.   Radiology    Dg Chest Port 1 View  Result Date: 06/03/2018 CLINICAL DATA:  Cough, shortness of breath. EXAM: PORTABLE CHEST 1 VIEW COMPARISON:  Radiographs of Jun 07, 2017. FINDINGS: Stable cardiomediastinal silhouette. No pneumothorax or pleural effusion is. Both lungs are clear. The visualized skeletal structures are unremarkable. IMPRESSION: No active disease. Electronically Signed   By: Marijo Conception M.D.   On: 06/03/2018 15:25    Cardiac Studies   N/a   Patient Profile     78 y.o. male with PMH of CAD (s/p prior stenting of LAD with patent stent by cath in 2016 and 06/2017 --> residual 60% D1 stenosis and moderate disease along the RCA), HTN, HLD, GERD, and gastritis (diagnosed by EGD on 05/31/2018) who was seen for the evaluation of chest pain/abnormal EKG while at [facility] and transferred  to [facility].   Assessment & Plan    1.  Newly documented atypical atrial flutter of uncertain duration: found on admission. Suspected to have started several weeks ago. Remains on BB therapy.  - CHA2DS2-VASc Score of 4 (HTN, Vascular, Age (2)). Placed on IV heparin pending his ischemic work up.  - could consider possible TEE/DCCV while inpatient or defer to outpatient setting. Further recommendations pending cardiac cath.  2.  Worsening dyspnea on exertion and fatigue as well as angina symptoms - although reporting some more atypical chest pain with cough (chest x-ray and COVID-19 negative). - cyclic troponin values have been negative and EKG shows no acute ischemic changes.  - planned for cardiac cath today  3. CAD - s/p prior stenting of LAD with patent stent by cath in 2016 and 06/2017 --> residual 60% D1 stenosis and moderate disease along the RCA.  - on ASA  4. HLD - followed by PCP.  - remains on Atorvastatin 80mg  daily with goal LDL less than 70.  For questions or updates, please contact [place] Please consult [email or web address] for contact info under     Signed, Reino Bellis, NP  06/05/2018, 7:05 AM    Patient seen, examined. Available data reviewed. Agree with findings, assessment, and plan as outlined by Reino Bellis, NP.  The physical exam findings outlined above reflect my personal findings from this morning's examination.  The patient has newly diagnosed atrial flutter with variable block and modestly elevated heart rate.  He describes New York Heart Association functional class III limitation from both shortness of breath and angina.  Plans for cardiac catheterization today to evaluate for obstructive CAD.  The patient currently is on IV heparin.  His cardiac catheterization from 1 year ago is reviewed when he underwent pressure wire analysis of moderate stenosis at the ostium of the diagonal branch.  His LAD stent was patent at that time.  As long as his CAD is stable,  will focus treatment on the patient's atrial flutter.  Will consider TEE cardioversion versus flutter ablation.  We will plan to make an attempt at restoring sinus rhythm during his hospitalization since he is highly symptomatic.  Sherren Mocha, M.D. 06/05/2018 9:12 AM

## 2018-06-06 DIAGNOSIS — I4892 Unspecified atrial flutter: Secondary | ICD-10-CM

## 2018-06-06 LAB — CBC
HCT: 37.5 % — ABNORMAL LOW (ref 39.0–52.0)
Hemoglobin: 12.7 g/dL — ABNORMAL LOW (ref 13.0–17.0)
MCH: 31.2 pg (ref 26.0–34.0)
MCHC: 33.9 g/dL (ref 30.0–36.0)
MCV: 92.1 fL (ref 80.0–100.0)
Platelets: 153 10*3/uL (ref 150–400)
RBC: 4.07 MIL/uL — ABNORMAL LOW (ref 4.22–5.81)
RDW: 13.3 % (ref 11.5–15.5)
WBC: 7.8 10*3/uL (ref 4.0–10.5)
nRBC: 0 % (ref 0.0–0.2)

## 2018-06-06 LAB — HEPARIN LEVEL (UNFRACTIONATED): Heparin Unfractionated: 0.42 IU/mL (ref 0.30–0.70)

## 2018-06-06 MED ORDER — APIXABAN 5 MG PO TABS
5.0000 mg | ORAL_TABLET | Freq: Two times a day (BID) | ORAL | Status: DC
  Administered 2018-06-06 – 2018-06-07 (×3): 5 mg via ORAL
  Filled 2018-06-06 (×3): qty 1

## 2018-06-06 MED ORDER — SODIUM CHLORIDE 0.9 % IV SOLN
250.0000 mL | INTRAVENOUS | Status: DC
  Administered 2018-06-06: 18:00:00 250 mL via INTRAVENOUS

## 2018-06-06 MED ORDER — SODIUM CHLORIDE 0.9% FLUSH: 3.0000 mL | Freq: Two times a day (BID) | INTRAVENOUS | Status: DC

## 2018-06-06 MED ORDER — POLYETHYLENE GLYCOL 3350 17 G PO PACK
17.0000 g | PACK | Freq: Every day | ORAL | Status: DC
  Administered 2018-06-06 – 2018-06-07 (×2): 17 g via ORAL
  Filled 2018-06-06: qty 1

## 2018-06-06 MED ORDER — SODIUM CHLORIDE 0.9% FLUSH: 3.0000 mL | INTRAVENOUS | Status: DC | PRN

## 2018-06-06 NOTE — Progress Notes (Signed)
Progress Note  Patient Name: Eric Benitez Date of Encounter: 06/06/2018  Primary Cardiologist: Carlyle Dolly, MD   Subjective   No chest pain or dyspnea at rest today.  No complaints this morning.  Inpatient Medications    Scheduled Meds: . aspirin  81 mg Oral Daily  . atorvastatin  80 mg Oral QHS  . lisinopril  2.5 mg Oral QHS  . metoprolol succinate  25 mg Oral Daily  . pantoprazole  40 mg Oral Daily  . ranolazine  500 mg Oral BID  . sodium chloride flush  3 mL Intravenous Q12H   Continuous Infusions: . sodium chloride    . heparin 850 Units/hr (06/06/18 0108)   PRN Meds: sodium chloride, acetaminophen **OR** acetaminophen, acetaminophen, albuterol, nitroGLYCERIN, ondansetron (ZOFRAN) IV, ondansetron **OR** [DISCONTINUED] ondansetron (ZOFRAN) IV, sodium chloride flush, zolpidem   Vital Signs    Vitals:   06/05/18 1056 06/05/18 1057 06/05/18 2113 06/06/18 0534  BP: (!) 137/91 134/78 127/86 123/85  Pulse: (!) 106 72 69 79  Resp: 15 19    Temp:   97.9 F (36.6 C) 97.8 F (36.6 C)  TempSrc:   Oral Oral  SpO2: 99% 100% 99% 98%  Weight:    72.7 kg  Height:        Intake/Output Summary (Last 24 hours) at 06/06/2018 0813 Last data filed at 06/06/2018 0535 Gross per 24 hour  Intake 970.43 ml  Output 725 ml  Net 245.43 ml   Last 3 Weights 06/06/2018 06/05/2018 06/04/2018  Weight (lbs) 160 lb 3.2 oz 159 lb 12.8 oz 159 lb 14.4 oz  Weight (kg) 72.666 kg 72.485 kg 72.53 kg      Telemetry    Atrial fibrillation, rate controlled with heart rate in the 63s and 70s - Personally Reviewed   Physical Exam  Alert, oriented male in no distress GEN: No acute distress.   Neck: No JVD Cardiac:  Irregularly irregular, no murmurs, rubs, or gallops.  Respiratory: Clear to auscultation bilaterally. GI: Soft, nontender, non-distended  MS: No edema; No deformity.  Right radial site clear. Neuro:  Nonfocal  Psych: Normal affect   Labs    Chemistry Recent Labs  Lab  06/03/18 1344 06/04/18 0544 06/05/18 0539  NA 134* 137 138  K 4.1 4.3 4.2  CL 102 107 106  CO2 23 23 23   GLUCOSE 157* 109* 112*  BUN 12 14 14   CREATININE 1.19 0.98 1.14  CALCIUM 8.9 8.7* 9.0  PROT 7.5  --   --   ALBUMIN 3.9  --   --   AST 24  --   --   ALT 26  --   --   ALKPHOS 67  --   --   BILITOT 0.7  --   --   GFRNONAA 59* >60 >60  GFRAA >60 >60 >60  ANIONGAP 9 7 9      Hematology Recent Labs  Lab 06/04/18 0544 06/05/18 0539 06/06/18 0351  WBC 7.7 8.8 7.8  RBC 4.32 4.24 4.07*  HGB 13.4 13.5 12.7*  HCT 40.3 39.0 37.5*  MCV 93.3 92.0 92.1  MCH 31.0 31.8 31.2  MCHC 33.3 34.6 33.9  RDW 13.3 13.3 13.3  PLT 169 167 153    Cardiac Enzymes Recent Labs  Lab 06/03/18 1344 06/03/18 2248 06/04/18 0544  TROPONINI <0.03 <0.03 <0.03   No results for input(s): TROPIPOC in the last 168 hours.   BNPNo results for input(s): BNP, PROBNP in the last 168 hours.   DDimer  No results for input(s): DDIMER in the last 168 hours.   Radiology    No results found.  Cardiac Studies   Cardiac Cath: Conclusion    Left dominant coronary anatomy  Left main is widely patent  LAD including the mid vessel stent is widely patent.  The ostial first diagonal contains 60 to 70% stenosis, unchanged from 1 year ago.  The circumflex territory is relatively small, contains irregularities in the mid body, and is unchanged from prior.  Right coronary contains diffuse luminal irregularities before giving origin to the PDA.  No focal or significant obstruction is noted.  Low normal LVEF, 50%.  LVEDP is normal.  RECOMMENDATIONS:   No change in coronary anatomy.  Further management of atrial flutter.    Patient Profile     78 y.o. male with known CAD and previous stenting of the LAD, now presenting with new atrial flutter of unknown duration and New York Heart Association functional class III symptoms of dyspnea and exertional angina  Assessment & Plan    1.  Symptomatic  atypical atrial flutter: Patient with chads 2 vascular score of 4, currently on IV heparin after cardiac catheterization.  Will transition to apixaban today and plan for TEE cardioversion tomorrow.  Risks, indications, and alternatives have been reviewed with the patient who understands and agrees with the plan.  Also discussed plan with his wife over the telephone at length.  Heart rate is currently well controlled on metoprolol succinate.  I reviewed his EKG with EP colleagues who agreed with plan for cardioversion.  His atrial flutter is not typical and ablation would be somewhat complex.  2.  Coronary artery disease, native vessel, with angina: Cardiac catheterization demonstrated stable findings with continued patency of the LAD stent and moderate diagonal stenosis.  The diagonal was interrogated last year with pressure wire analysis which was negative.  Continue medical management.  Disposition: Anticipate hospital discharge tomorrow following TEE cardioversion.  Transition to apixaban today as outlined.  For questions or updates, please contact [facility] Please consult [email or web address] for contact info under        Signed, Sherren Mocha, MD  06/06/2018, 8:13 AM

## 2018-06-06 NOTE — Progress Notes (Signed)
[facility] for Heparin > apixaban  Indication: atrial fibrillation  Allergies  Allergen Reactions  . Vancomycin Rash    Patient Measurements: Height: 5\' 9"  (175.3 cm) Weight: 160 lb 3.2 oz (72.7 kg) IBW/kg (Calculated) : 70.7 Heparin Dosing Weight: 73 kg  Vital Signs: Temp: 97.8 F (36.6 C) (04/30 0534) Temp Source: Oral (04/30 0534) BP: 123/85 (04/30 0534) Pulse Rate: 79 (04/30 0534)  Labs: Recent Labs    06/03/18 1344 06/03/18 2248 06/04/18 0544 06/04/18 1222  06/05/18 0539 06/05/18 0728 06/05/18 2253 06/06/18 0351  HGB 14.0  --  13.4  --   --  13.5  --   --  12.7*  HCT 42.1  --  40.3  --   --  39.0  --   --  37.5*  PLT 179  --  169  --   --  167  --   --  153  LABPROT  --   --   --  14.9  --   --   --   --   --   INR  --   --   --  1.2  --   --   --   --   --   HEPARINUNFRC  --   --   --   --    < >  --  0.99* 0.33 0.42  CREATININE 1.19  --  0.98  --   --  1.14  --   --   --   TROPONINI <0.03 <0.03 <0.03  --   --   --   --   --   --    < > = values in this interval not displayed.    Estimated Creatinine Clearance: 54.3 mL/min (by C-G formula based on SCr of 1.14 mg/dL).   Medical History: Past Medical History:  Diagnosis Date  . Arthritis    "neck, hands, fingers" (04/09/2014)  . Bleeds easily ([place])   . CAD (coronary artery disease)    a. s/p prior stenting of LAD b.  patent stent by cath in 2016 and 06/2017 --> residual 60% D1 stenosis and moderate disease along the RCA  . Hyperlipemia   . Hypertension   . Kidney stones    "I've had several; had to go in and get them once" (04/09/2014)  . Pneumonia 1940's    Medications:  . sodium chloride       Assessment: 76 yoM with new AFL started on IV heparin.  Heparin then held for cath lab.  Pharmacy asked to resume 4 hrs after sheath out.  Sheath removed ~ 11 AM. Heparin drip 850 uts/hr HL 0.4 this am > plan to change to apixaban for planned DCCV 5/1  Cr < 1.5, Age <  25, wt > 60kg  No S/Sx bleeding noted per RN, CBC stable.   Goal of Therapy:  Heparin level 0.3-0.7 units/ml Monitor platelets by anticoagulation protocol: Yes   Plan:   Stop heparin drip apixaban 5mg  BID  Bonnita Nasuti Pharm.D. CPP, BCPS Clinical Pharmacist [phone] 06/06/2018 9:16 AM

## 2018-06-06 NOTE — Discharge Instructions (Addendum)
YOUR CARDIOLOGY TEAM HAS ARRANGED FOR AN E-VISIT FOR YOUR APPOINTMENT - PLEASE REVIEW IMPORTANT INFORMATION BELOW SEVERAL DAYS PRIOR TO YOUR APPOINTMENT  Due to the recent COVID-19 pandemic, we are transitioning in-person office visits to tele-medicine visits in an effort to decrease unnecessary exposure to our patients, their families, and staff. These visits are billed to your insurance just like a normal visit is. We also encourage you to sign up for MyChart if you have not already done so. You will need a smartphone if possible. For patients that do not have this, we can still complete the visit using a regular telephone but do prefer a smartphone to enable video when possible. You may have a family member that lives with you that can help. If possible, we also ask that you have a blood pressure cuff and scale at home to measure your blood pressure, heart rate and weight prior to your scheduled appointment. Patients with clinical needs that need an in-person evaluation and testing will still be able to come to the office if absolutely necessary. If you have any questions, feel free to call our office.     YOUR PROVIDER WILL BE USING THE FOLLOWING PLATFORM TO COMPLETE YOUR VISIT:   IF USING MYCHART - How to Download the MyChart App to Your SmartPhone   - If Apple, go to [employer] and type in MyChart in the search bar and download the app. If Android, ask patient to go to [employer] and type in [place] in the search bar and download the app. The app is free but as with any other app downloads, your phone may require you to verify saved payment information or Apple/Android password.  - You will need to then log into the app with your MyChart username and password, and select [facility] as your healthcare provider to link the account.  - When it is time for your visit, go to the MyChart app, find appointments, and click Begin Video Visit. Be sure to Select Allow for your device to access the  Microphone and Camera for your visit. You will then be connected, and your provider will be with you shortly.  **If you have any issues connecting or need assistance, please contact MyChart service desk [phone])**  **If using a computer, in order to ensure the best quality for your visit, you will need to use either of the following Internet Browsers: Insurance underwriter or Microsoft Edge**   IF USING DOXIMITY or [email or web address] - The staff will give you instructions on receiving your link to join the meeting the day of your visit.      2-3 DAYS BEFORE YOUR APPOINTMENT  You will receive a telephone call from one of our [facility] team members - your caller ID may say "Unknown caller." If this is a video visit, we will walk you through how to get the video launched on your phone. We will remind you check your blood pressure, heart rate and weight prior to your scheduled appointment. If you have an Apple Watch or Kardia, please upload any pertinent ECG strips the day before or morning of your appointment to [facility]. Our staff will also make sure you have reviewed the consent and agree to move forward with your scheduled tele-health visit.     THE DAY OF YOUR APPOINTMENT  Approximately 15 minutes prior to your scheduled appointment, you will receive a telephone call from one of [facility] team - your caller ID may say "Unknown caller."  Our  staff will confirm medications, vital signs for the day and any symptoms you may be experiencing. Please have this information available prior to the time of visit start. It may also be helpful for you to have a pad of paper and pen handy for any instructions given during your visit. They will also walk you through joining the smartphone meeting if this is a video visit.    CONSENT FOR TELE-HEALTH VISIT - PLEASE REVIEW  I hereby voluntarily request, consent and authorize [facility] and its employed or contracted physicians, physician assistants, nurse  practitioners or other licensed health care professionals (the Practitioner), to provide me with telemedicine health care services (the Services") as deemed necessary by the treating Practitioner. I acknowledge and consent to receive the Services by the Practitioner via telemedicine. I understand that the telemedicine visit will involve communicating with the Practitioner through live audiovisual communication technology and the disclosure of certain medical information by electronic transmission. I acknowledge that I have been given the opportunity to request an in-person assessment or other available alternative prior to the telemedicine visit and am voluntarily participating in the telemedicine visit.  I understand that I have the right to withhold or withdraw my consent to the use of telemedicine in the course of my care at any time, without affecting my right to future care or treatment, and that the Practitioner or I may terminate the telemedicine visit at any time. I understand that I have the right to inspect all information obtained and/or recorded in the course of the telemedicine visit and may receive copies of available information for a reasonable fee.  I understand that some of the potential risks of receiving the Services via telemedicine include:   Delay or interruption in medical evaluation due to technological equipment failure or disruption;  Information transmitted may not be sufficient (e.g. poor resolution of images) to allow for appropriate medical decision making by the Practitioner; and/or   In rare instances, security protocols could fail, causing a breach of personal health information.  Furthermore, I acknowledge that it is my responsibility to provide information about my medical history, conditions and care that is complete and accurate to the best of my ability. I acknowledge that Practitioner's advice, recommendations, and/or decision may be based on factors not within  their control, such as incomplete or inaccurate data provided by me or distortions of diagnostic images or specimens that may result from electronic transmissions. I understand that the practice of medicine is not an exact science and that Practitioner makes no warranties or guarantees regarding treatment outcomes. I acknowledge that I will receive a copy of this consent concurrently upon execution via email to the email address I last provided but may also request a printed copy by calling the office of [facility].    I understand that my insurance will be billed for this visit.   I have read or had this consent read to me.  I understand the contents of this consent, which adequately explains the benefits and risks of the Services being provided via telemedicine.   I have been provided ample opportunity to ask questions regarding this consent and the Services and have had my questions answered to my satisfaction.  I give my informed consent for the services to be provided through the use of telemedicine in my medical care  By participating in this telemedicine visit I agree to the above.       Information on my medicine - ELIQUIS (apixaban)  This medication  education was reviewed with me or my healthcare representative as part of my discharge preparation.  Why was Eliquis prescribed for you? Eliquis was prescribed for you to reduce the risk of forming blood clots that can cause a stroke if you have a medical condition called atrial fibrillation (a type of irregular heartbeat) What do You need to know about Eliquis ? Take your Eliquis TWICE DAILY - one tablet in the morning and one tablet in the evening with or without food.  It would be best to take the doses about the same time each day.  If you have difficulty swallowing the tablet whole please discuss with your pharmacist how to take the medication safely.  Take Eliquis exactly as prescribed by your doctor and DO NOT stop  taking Eliquis without talking to the doctor who prescribed the medication.  Stopping may increase your risk of developing a new clot or stroke.  Refill your prescription before you run out.  After discharge, you should have regular check-up appointments with your healthcare provider that is prescribing your Eliquis.  In the future your dose may need to be changed if your kidney function or weight changes by a significant amount or as you get older.  What do you do if you miss a dose? If you miss a dose, take it as soon as you remember on the same day and resume taking twice daily.  Do not take more than one dose of ELIQUIS at the same time.  Important Safety Information A possible side effect of Eliquis is bleeding. You should call your healthcare provider right away if you experience any of the following: ? Bleeding from an injury or your nose that does not stop. ? Unusual colored urine (red or dark brown) or unusual colored stools (red or black). ? Unusual bruising for unknown reasons. ? A serious fall or if you hit your head (even if there is no bleeding).  Some medicines may interact with Eliquis and might increase your risk of bleeding or clotting while on Eliquis. To help avoid this, consult your healthcare provider or pharmacist prior to using any new prescription or non-prescription medications, including herbals, vitamins, non-steroidal anti-inflammatory drugs (NSAIDs) and supplements.  This website has more information on Eliquis (apixaban): [email or web address].

## 2018-06-07 ENCOUNTER — Inpatient Hospital Stay (HOSPITAL_COMMUNITY): Payer: PPO

## 2018-06-07 ENCOUNTER — Inpatient Hospital Stay (HOSPITAL_COMMUNITY): Payer: PPO | Admitting: Anesthesiology

## 2018-06-07 ENCOUNTER — Telehealth: Payer: Self-pay | Admitting: Physician Assistant

## 2018-06-07 ENCOUNTER — Encounter (HOSPITAL_COMMUNITY)
Admission: EM | Disposition: A | Discharge status: Home / Self Care | Payer: Self-pay | Source: Home / Self Care | Attending: Internal Medicine

## 2018-06-07 ENCOUNTER — Encounter (HOSPITAL_COMMUNITY): Payer: Self-pay

## 2018-06-07 DIAGNOSIS — I4892 Unspecified atrial flutter: Secondary | ICD-10-CM

## 2018-06-07 DIAGNOSIS — I340 Nonrheumatic mitral (valve) insufficiency: Secondary | ICD-10-CM

## 2018-06-07 DIAGNOSIS — I483 Typical atrial flutter: Secondary | ICD-10-CM

## 2018-06-07 HISTORY — PX: CARDIOVERSION: SHX1299

## 2018-06-07 HISTORY — PX: TEE WITHOUT CARDIOVERSION: SHX5443

## 2018-06-07 LAB — BASIC METABOLIC PANEL
Anion gap: 10 (ref 5–15)
BUN: 12 mg/dL (ref 8–23)
CO2: 21 mmol/L — ABNORMAL LOW (ref 22–32)
Calcium: 8.8 mg/dL — ABNORMAL LOW (ref 8.9–10.3)
Chloride: 106 mmol/L (ref 98–111)
Creatinine, Ser: 1.08 mg/dL (ref 0.61–1.24)
GFR calc Af Amer: >60 mL/min (ref >60)
GFR calc non Af Amer: >60 mL/min (ref >60)
Glucose, Bld: 106 mg/dL — ABNORMAL HIGH (ref 70–99)
Potassium: 4.3 mmol/L (ref 3.5–5.1)
Sodium: 137 mmol/L (ref 135–145)

## 2018-06-07 LAB — CBC
HCT: 38.1 % — ABNORMAL LOW (ref 39.0–52.0)
Hemoglobin: 13.1 g/dL (ref 13.0–17.0)
MCH: 31.6 pg (ref 26.0–34.0)
MCHC: 34.4 g/dL (ref 30.0–36.0)
MCV: 91.8 fL (ref 80.0–100.0)
Platelets: 156 10*3/uL (ref 150–400)
RBC: 4.15 MIL/uL — ABNORMAL LOW (ref 4.22–5.81)
RDW: 13.2 % (ref 11.5–15.5)
WBC: 8.7 10*3/uL (ref 4.0–10.5)
nRBC: 0 % (ref 0.0–0.2)

## 2018-06-07 SURGERY — ECHOCARDIOGRAM, TRANSESOPHAGEAL
Anesthesia: Moderate Sedation

## 2018-06-07 SURGERY — ECHOCARDIOGRAM, TRANSESOPHAGEAL
Anesthesia: Monitor Anesthesia Care

## 2018-06-07 MED ORDER — APIXABAN 5 MG PO TABS: 5.0000 mg | ORAL_TABLET | Freq: Two times a day (BID) | ORAL | 11 refills | Status: DC

## 2018-06-07 MED ORDER — PROPOFOL 10 MG/ML IV BOLUS
INTRAVENOUS | Status: DC | PRN
  Administered 2018-06-07: 100 mg via INTRAVENOUS

## 2018-06-07 MED ORDER — MAGNESIUM HYDROXIDE 400 MG/5ML PO SUSP: 30.0000 mL | Freq: Every day | ORAL | Status: DC | PRN

## 2018-06-07 MED ORDER — ONDANSETRON HCL 4 MG/2ML IJ SOLN
INTRAMUSCULAR | Status: DC | PRN
  Administered 2018-06-07: 4 mg via INTRAVENOUS

## 2018-06-07 MED ORDER — SUCCINYLCHOLINE CHLORIDE 200 MG/10ML IV SOSY
PREFILLED_SYRINGE | INTRAVENOUS | Status: DC | PRN
  Administered 2018-06-07: 100 mg via INTRAVENOUS

## 2018-06-07 MED ORDER — LIDOCAINE 2% (20 MG/ML) 5 ML SYRINGE
INTRAMUSCULAR | Status: DC | PRN
  Administered 2018-06-07: 100 mg via INTRAVENOUS

## 2018-06-07 MED ORDER — LACTATED RINGERS IV SOLN
INTRAVENOUS | Status: DC
  Administered 2018-06-07: 12:00:00 via INTRAVENOUS

## 2018-06-07 MED FILL — ELIQUIS 5 MG TABLET: 5 | 30 days supply | Qty: 60 | Fill #0 | Status: TO

## 2018-06-07 NOTE — Telephone Encounter (Signed)
   TELEPHONE CALL NOTE  This patient has been deemed a candidate for follow-up tele-health visit to limit community exposure during the Covid-19 pandemic. I spoke with the patient via phone to discuss instructions. This has been outlined on the patient's AVS (dotphrase: hcevisitinfo). The patient was advised to review the section on consent for treatment as well. The patient will receive a phone call 2-3 days prior to their E-Visit at which time consent will be verbally confirmed.   A Virtual Office Visit appointment type Will be scheduled for hospital follow up with Dr. Harl Bowie @ [facility], with "VIDEO" or "TELEPHONE" in the appointment notes - patient prefers VIDEO type.  I have either confirmed the patient is active in MyChart or offered to send sign-up link to phone/email via Mychart icon beside patient's photo.  [place], [place] 06/07/2018 4:17 PM

## 2018-06-07 NOTE — H&P (View-Only) (Signed)
Progress Note  Patient Name: Eric Benitez Date of Encounter: 06/07/2018  Primary Cardiologist: Carlyle Dolly, MD   Subjective   Feels well with mild chest pressure that has pretty much been continuous for the past several days.  No shortness of breath at rest.  Able to lie flat without dyspnea.  Inpatient Medications    Scheduled Meds: . apixaban  5 mg Oral Q12H  . aspirin  81 mg Oral Daily  . atorvastatin  80 mg Oral QHS  . lisinopril  2.5 mg Oral QHS  . metoprolol succinate  25 mg Oral Daily  . pantoprazole  40 mg Oral Daily  . polyethylene glycol  17 g Oral Daily  . ranolazine  500 mg Oral BID  . sodium chloride flush  3 mL Intravenous Q12H  . sodium chloride flush  3 mL Intravenous Q12H   Continuous Infusions: . sodium chloride    . sodium chloride 250 mL (06/06/18 1824)   PRN Meds: sodium chloride, acetaminophen **OR** acetaminophen, acetaminophen, albuterol, magnesium hydroxide, nitroGLYCERIN, ondansetron (ZOFRAN) IV, ondansetron **OR** [DISCONTINUED] ondansetron (ZOFRAN) IV, sodium chloride flush, sodium chloride flush, zolpidem   Vital Signs    Vitals:   06/05/18 2113 06/06/18 0534 06/06/18 2026 06/07/18 0311  BP: 127/86 123/85 113/77 (!) 144/94  Pulse: 69 79 72 97  Resp:   18 18  Temp: 97.9 F (36.6 C) 97.8 F (36.6 C) 98 F (36.7 C) 97.8 F (36.6 C)  TempSrc: Oral Oral Oral Oral  SpO2: 99% 98% 98% 97%  Weight:  72.7 kg  70.3 kg  Height:       No intake or output data in the 24 hours ending 06/07/18 0827 Last 3 Weights 06/07/2018 06/06/2018 06/05/2018  Weight (lbs) 154 lb 14.4 oz 160 lb 3.2 oz 159 lb 12.8 oz  Weight (kg) 70.262 kg 72.666 kg 72.485 kg      Telemetry    Atrial flutter with variable response, heart rate 74 bpm, no ventricular arrhythmia, no bradycardic events - Personally Reviewed   Physical Exam  Alert, oriented male in no distress GEN: No acute distress.   Neck: No JVD Cardiac:  Irregularly irregular, no murmurs, rubs, or  gallops.  Respiratory: Clear to auscultation bilaterally. GI: Soft, nontender, non-distended  MS: No edema; No deformity. Neuro:  Nonfocal  Psych: Normal affect   Labs    Chemistry Recent Labs  Lab 06/03/18 1344 06/04/18 0544 06/05/18 0539 06/07/18 0307  NA 134* 137 138 137  K 4.1 4.3 4.2 4.3  CL 102 107 106 106  CO2 23 23 23  21*  GLUCOSE 157* 109* 112* 106*  BUN 12 14 14 12   CREATININE 1.19 0.98 1.14 1.08  CALCIUM 8.9 8.7* 9.0 8.8*  PROT 7.5  --   --   --   ALBUMIN 3.9  --   --   --   AST 24  --   --   --   ALT 26  --   --   --   ALKPHOS 67  --   --   --   BILITOT 0.7  --   --   --   GFRNONAA 59* >60 >60 >60  GFRAA >60 >60 >60 >60  ANIONGAP 9 7 9 10      Hematology Recent Labs  Lab 06/05/18 0539 06/06/18 0351 06/07/18 0307  WBC 8.8 7.8 8.7  RBC 4.24 4.07* 4.15*  HGB 13.5 12.7* 13.1  HCT 39.0 37.5* 38.1*  MCV 92.0 92.1 91.8  MCH 31.8  31.2 31.6  MCHC 34.6 33.9 34.4  RDW 13.3 13.3 13.2  PLT 167 153 156    Cardiac Enzymes Recent Labs  Lab 06/03/18 1344 06/03/18 2248 06/04/18 0544  TROPONINI <0.03 <0.03 <0.03   No results for input(s): TROPIPOC in the last 168 hours.   BNPNo results for input(s): BNP, PROBNP in the last 168 hours.   DDimer No results for input(s): DDIMER in the last 168 hours.   Radiology    No results found.  Cardiac Studies   LEFT HEART CATH AND CORONARY ANGIOGRAPHY  06/05/18  Conclusion    Left dominant coronary anatomy  Left main is widely patent  LAD including the mid vessel stent is widely patent.  The ostial first diagonal contains 60 to 70% stenosis, unchanged from 1 year ago.  The circumflex territory is relatively small, contains irregularities in the mid body, and is unchanged from prior.  Right coronary contains diffuse luminal irregularities before giving origin to the PDA.  No focal or significant obstruction is noted.  Low normal LVEF, 50%.  LVEDP is normal.  RECOMMENDATIONS:   No change in coronary  anatomy.  Further management of atrial flutter.     Patient Profile     78 y.o. male with known CAD and previous stenting of the LAD, now presenting with new atrial flutter of unknown duration and New York Heart Association functional class III symptoms of dyspnea and exertional angina  Assessment & Plan    1.  Symptomatic atypical atrial flutter: CHADSVASCs score of 4. Treated with IV heparin and now transition to apixaban.  Plan for TEE cardioversion today.  2.  Coronary artery disease, native vessel, with angina: Cardiac catheterization demonstrated stable findings with continued patency of the LAD stent and moderate diagonal stenosis.  The diagonal was interrogated last year with pressure wire analysis which was negative. -Continue medical management with ASA, satin, BB and lisinopril.   For questions or updates, please contact [place] Please consult [email or web address] for contact info under     SignedLeanor Kail, [place]  06/07/2018, 8:27 AM    Patient seen, examined. Available data reviewed. Agree with findings, assessment, and plan as outlined by Robbie Lis, PA.  The patient is personally examined this morning.  The exam findings documented above reflect my personal exam of this patient.  Plan as previously outlined with TEE cardioversion today for treatment of symptomatic atypical atrial flutter.  The patient's coronary disease is stable.  I anticipate stopping aspirin at discharge and continuing him on apixaban long-term.  Anticipate discharge home late this afternoon following cardioversion as long as his rhythm is stable.  Sherren Mocha, M.D. 06/07/2018 10:04 AM

## 2018-06-07 NOTE — Anesthesia Postprocedure Evaluation (Signed)
Anesthesia Post Note  Patient: Kashden D Nipp  Procedure(s) Performed: TRANSESOPHAGEAL ECHOCARDIOGRAM (TEE) (N/A ) CARDIOVERSION (N/A )     Patient location during evaluation: PACU Anesthesia Type: General Level of consciousness: awake and alert Pain management: pain level controlled Vital Signs Assessment: post-procedure vital signs reviewed and stable Respiratory status: spontaneous breathing, nonlabored ventilation, respiratory function stable and patient connected to nasal cannula oxygen Cardiovascular status: blood pressure returned to baseline and stable Postop Assessment: no apparent nausea or vomiting Anesthetic complications: no    Last Vitals:  Vitals:   06/07/18 1338 06/07/18 1350  BP: 112/62 (!) 119/58  Pulse: 85 81  Resp: 20 19  Temp: 36.8 C   SpO2: 99% 98%    Last Pain:  Vitals:   06/07/18 1350  TempSrc:   PainSc: 0-No pain                 Dasiah Hooley S

## 2018-06-07 NOTE — Discharge Summary (Addendum)
Discharge Summary    Patient ID: Eric Benitez MRN: [id number]; DOB: 12-08-1940  Admit date: 06/03/2018 Discharge date: 06/07/2018  Primary Care Provider: Caryl Bis, MD  Primary Cardiologist: Carlyle Dolly, MD  [facility])  Discharge Diagnoses    Principal Problem:   New onset atrial flutter [facility]) Active Problems:   Essential hypertension   Hyperlipidemia with target LDL less than 70   Chest heaviness   RLL pneumonia (HCC)   PUD (peptic ulcer disease)   Angina pectoris ([place])   Coronary artery disease   Chest pain   Typical atrial flutter ([place])   Allergies Allergies  Allergen Reactions  . Vancomycin Rash    Diagnostic Studies/Procedures    TEE/DCCV 06/07/2018 FINDINGS:  LEFT VENTRICLE: EF = 60% + LVH. No RWMA  RIGHT VENTRICLE: Normal  LEFT ATRIUM: Mildly dilated   LEFT ATRIAL APPENDAGE: No clot  RIGHT ATRIUM: Normal  AORTIC VALVE:  Trileaflet. Mild thickening. Trivial AI  MITRAL VALVE:   Posterior leaflet restricted. Mild to moderate MR  TRICUSPID VALVE: Normal trivial TR  PULMONIC VALVE: Normal Trivial PR  INTERATRIAL SEPTUM: + lipomatous hypertrophy  No PFO/ASD  PERICARDIUM: No effusion  DESCENDING AORTA: Not visualized   CARDIOVERSION:     Indications:  Atrial Flutter  Procedure Details:  Once the TEE was complete, the patient had the defibrillator pads placed in the anterior and posterior position. Once an appropriate level of sedation was achieved, the patient received a single biphasic, synchronized 150J shock with prompt conversion to sinus rhythm. No apparent complications.  LEFT HEART CATH AND CORONARY ANGIOGRAPHY  06/05/18  Conclusion    Left dominant coronary anatomy  Left main is widely patent  LAD including the mid vessel stent is widely patent. The ostial first diagonal contains 60 to 70% stenosis, unchanged from 1 year ago.  The circumflex territory is relatively small, contains irregularities in the  mid body, and is unchanged from prior.  Right coronary contains diffuse luminal irregularities before giving origin to the PDA. No focal or significant obstruction is noted.  Low normal LVEF, 50%. LVEDP is normal.  RECOMMENDATIONS:   No change in coronary anatomy.  Further management of atrial flutter.      History of Present Illness     Eric Benitez is a 78 y.o. male with past medical history of CAD (s/p prior stenting of LAD with patent stent by cath in 2016 and 06/2017 --> residual 60% D1 stenosis and moderate disease along the RCA), HTN, HLD, GERD, and gastritis (diagnosed by EGD on 05/31/2018) presented to [facility] ER 06/03/2018 dyspnea and new-onset weakness and dizziness.  He has been having a recent cough, no fevers or chills however.  He stated that over the last month he has had more fatigue with activity, recurring episodes of chest tightness, and within the last few days significant neck discomfort when he was working outdoors.  Denied any recent orthopnea, PND or edema.   Influenza and COVID-19 testing negative. Mg 1.8. Initial and cyclic troponin values have been negative. CXR shows no active cardiopulmonary disease. EKG shows newly documented atypical atrial flutter, HR 100, with variable conduction.  He has not been aware of any sense of palpitations.  He underwent an EGD recently on April 24 by Dr. Oneida Alar for evaluation of possible melena, was found to have gastritis treated with PPI, his hemoglobin is normal at this time.      Hospital Course     Consultants: IM as attending at [facility]  Admitted to  [facility] with newly documented atypical atrial flutter of uncertain duration. Anticoagulated with IV heparin for CHADSVASCs score of 4. Felt his symptoms potentially related to either ischemic heart disease, cardiac arrhythmia which is newly documented, or combination of the two. Transferred to [facility] for Cath and possible cardioversion.   1. Symptomatic atypical atrial flutter:  CHADSVASCs score of 4. Treated with IV heparin and then transition to apixaban. Maintained on home dose of metoprolol. Patient underwent successful TEE cardioversion today and felt stable for discharge.   2. Coronary artery disease, native vessel, with angina: Cardiac catheterization demonstrated stable findings with continued patency of the LAD stent and moderate diagonal stenosis. The diagonal was interrogated last year with pressure wire analysis which was negative.  -Continue medical management with satin, BB and lisinopril. Stopped ASA given recent hx of anemia. Hemoglobin remained stable during admission.   The patient been seen by Dr. Burt Knack today and deemed ready for discharge home. All follow-up appointments have been scheduled. Discharge medications are listed below.    Discharge Vitals Blood pressure 117/76, pulse 78, temperature 98.2 F (36.8 C), temperature source Oral, resp. rate 20, height 5\' 9"  (1.753 m), weight 70.3 kg, SpO2 97 %.  Filed Weights   06/05/18 0612 06/06/18 0534 06/07/18 0311  Weight: 72.5 kg 72.7 kg 70.3 kg    Labs & Radiologic Studies    CBC Recent Labs    06/06/18 0351 06/07/18 0307  WBC 7.8 8.7  HGB 12.7* 13.1  HCT 37.5* 38.1*  MCV 92.1 91.8  PLT 153 921   Basic Metabolic Panel Recent Labs    06/05/18 0539 06/07/18 0307  NA 138 137  K 4.2 4.3  CL 106 106  CO2 23 21*  GLUCOSE 112* 106*  BUN 14 12  CREATININE 1.14 1.08  CALCIUM 9.0 8.8*   Hemoglobin A1C Recent Labs    06/05/18 0539  HGBA1C 5.8*   Fasting Lipid Panel Recent Labs    06/05/18 0539  CHOL 123  HDL 50  LDLCALC 66  TRIG 37  CHOLHDL 2.5   Thyroid Function Tests No results for input(s): TSH, T4TOTAL, T3FREE, THYROIDAB in the last 72 hours.  Invalid input(s): FREET3 _____________  Dg Chest Port 1 View  Result Date: 06/03/2018 CLINICAL DATA:  Cough, shortness of breath. EXAM: PORTABLE CHEST 1 VIEW COMPARISON:  Radiographs of Jun 07, 2017. FINDINGS: Stable  cardiomediastinal silhouette. No pneumothorax or pleural effusion is. Both lungs are clear. The visualized skeletal structures are unremarkable. IMPRESSION: No active disease. Electronically Signed   By: Marijo Conception M.D.   On: 06/03/2018 15:25   Disposition   Pt is being discharged home today in good condition.  Follow-up Plans & Appointments    Follow-up Information    Branch, Alphonse Guild, MD Follow up.   Specialty:  Cardiology Why:  Office will call with time and date of appointment Contact information: [place] [address] [phone]          Discharge Instructions    Diet - low sodium heart healthy   Complete by:  As directed    Discharge instructions   Complete by:  As directed    No driving fo U31 hours. No lifting over 5 lbs for 1 week. No sexual activity for 1 week. Marland Kitchen Keep procedure site clean & dry. If you notice increased pain, swelling, bleeding or pus, call/return!  You may shower, but no soaking baths/hot tubs/pools for 1 week.   Increase activity slowly  Complete by:  As directed       Discharge Medications   Allergies as of 06/07/2018      Reactions   Vancomycin Rash      Medication List    STOP taking these medications   acetaminophen 500 MG tablet Commonly known as:  TYLENOL   aspirin EC 81 MG tablet     TAKE these medications   albuterol 108 (90 Base) MCG/ACT inhaler Commonly known as:  VENTOLIN HFA Inhale 2 puffs into the lungs every 6 (six) hours as needed for wheezing or shortness of breath.   ALLERGY RELIEF NA Place 2 sprays into the nose daily as needed (allergies).   apixaban 5 MG Tabs tablet Commonly known as:  ELIQUIS Take 1 tablet (5 mg total) by mouth every 12 (twelve) hours.   ARTIFICIAL TEARS OP Place 1-2 drops into both eyes daily as needed (for dry eyes).   atorvastatin 80 MG tablet Commonly known as:  LIPITOR Take 80 mg by mouth at bedtime.   diphenhydramine-acetaminophen 25-500 MG Tabs  tablet Commonly known as:  TYLENOL PM Take 2 tablets by mouth at bedtime.   doxazosin 8 MG tablet Commonly known as:  CARDURA Take 8 mg by mouth at bedtime.   lisinopril 2.5 MG tablet Commonly known as:  ZESTRIL TAKE 1 TABLET BY MOUTH EVERY DAY What changed:  when to take this   loratadine 10 MG tablet Commonly known as:  CLARITIN Take 10 mg by mouth daily.   metoprolol succinate 25 MG 24 hr tablet Commonly known as:  TOPROL-XL Take 25 mg by mouth daily.   nitroGLYCERIN 0.4 MG SL tablet Commonly known as:  NITROSTAT PLACE 1 TABLET (0.4 MG TOTAL) UNDER THE TONGUE EVERY 5 (FIVE) MINUTES AS NEEDED FOR CHEST PAIN.   pantoprazole 40 MG tablet Commonly known as:  PROTONIX TAKE 1 TABLET BY MOUTH DAILY What changed:    how much to take  how to take this  when to take this   ranolazine 500 MG 12 hr tablet Commonly known as:  RANEXA Take 500 mg by mouth 2 (two) times daily.   senna 8.6 MG tablet Commonly known as:  SENOKOT Take 3 tablets by mouth at bedtime.   vitamin B-12 1000 MCG tablet Commonly known as:  CYANOCOBALAMIN Take 1,000 mcg by mouth daily.        Acute coronary syndrome (MI, NSTEMI, STEMI, etc) this admission?: No.    Outstanding Labs/Studies   BMET at follow up  Duration of Discharge Encounter   Greater than 30 minutes including physician time.  Jarrett Soho, [place] 06/07/2018, 5:06 PM

## 2018-06-07 NOTE — Anesthesia Procedure Notes (Signed)
Procedure Name: Intubation Date/Time: 06/07/2018 1:10 PM Performed by: Eligha Bridegroom, CRNA Pre-anesthesia Checklist: Patient identified, Emergency Drugs available, Suction available, Patient being monitored and Timeout performed Patient Re-evaluated:Patient Re-evaluated prior to induction Oxygen Delivery Method: Circle system utilized Preoxygenation: Pre-oxygenation with 100% oxygen Induction Type: IV induction Grade View: Grade I Tube type: Oral Tube size: 7.5 mm Airway Equipment and Method: Stylet Placement Confirmation: ETT inserted through vocal cords under direct vision,  positive ETCO2 and breath sounds checked- equal and bilateral Secured at: 22 cm Tube secured with: Tape Dental Injury: Teeth and Oropharynx as per pre-operative assessment

## 2018-06-07 NOTE — Interval H&P Note (Signed)
History and Physical Interval Note:  06/07/2018 12:31 PM  Eric Benitez  has presented today for surgery, with the diagnosis of A. Fib.  The various methods of treatment have been discussed with the patient and family. After consideration of risks, benefits and other options for treatment, the patient has consented to  Procedure(s): TRANSESOPHAGEAL ECHOCARDIOGRAM (TEE) (N/A) CARDIOVERSION (N/A) as a surgical intervention.  The patient's history has been reviewed, patient examined, no change in status, stable for surgery.  I have reviewed the patient's chart and labs.  Questions were answered to the patient's satisfaction.     Daniel Bensimhon

## 2018-06-07 NOTE — CV Procedure (Signed)
   TRANSESOPHAGEAL ECHOCARDIOGRAM GUIDED DIRECT CURRENT CARDIOVERSION  NAME:  Eric Benitez   MRN: [id number] DOB:  07-02-1940   ADMIT DATE: 06/03/2018  INDICATIONS:  Atrial flutter  PROCEDURE:   Informed consent was obtained prior to the procedure. The risks, benefits and alternatives for the procedure were discussed and the patient comprehended these risks.  Risks include, but are not limited to, cough, sore throat, vomiting, nausea, somnolence, esophageal and stomach trauma or perforation, bleeding, low blood pressure, aspiration, pneumonia, infection, trauma to the teeth and death.    After a procedural time-out, the oropharynx was anesthetized and the patient was sedated and intubated by the anesthesia service due to COVID 19 precautions. The transesophageal probe was inserted in the esophagus and stomach without difficulty and multiple views were obtained.   FINDINGS:  LEFT VENTRICLE: EF = 60% + LVH. No RWMA  RIGHT VENTRICLE: Normal  LEFT ATRIUM: Mildly dilated   LEFT ATRIAL APPENDAGE: No clot  RIGHT ATRIUM: Normal  AORTIC VALVE:  Trileaflet. Mild thickening. Trivial AI  MITRAL VALVE:   Posterior leaflet restricted. Mild to moderate MR  TRICUSPID VALVE: Normal trivial TR  PULMONIC VALVE: Normal Trivial PR  INTERATRIAL SEPTUM: + lipomatous hypertrophy  No PFO/ASD  PERICARDIUM: No effusion  DESCENDING AORTA: Not visualized   CARDIOVERSION:     Indications:  Atrial Flutter  Procedure Details:  Once the TEE was complete, the patient had the defibrillator pads placed in the anterior and posterior position. Once an appropriate level of sedation was achieved, the patient received a single biphasic, synchronized 150J shock with prompt conversion to sinus rhythm. No apparent complications.   Glori Bickers, MD  1:28 PM

## 2018-06-07 NOTE — Transfer of Care (Signed)
Immediate Anesthesia Transfer of Care Note  Patient: Eric Benitez  Procedure(s) Performed: TRANSESOPHAGEAL ECHOCARDIOGRAM (TEE) (N/A ) CARDIOVERSION (N/A )  Patient Location: PACU  Anesthesia Type:General  Level of Consciousness: awake, alert  and oriented  Airway & Oxygen Therapy: Patient Spontanous Breathing  Post-op Assessment: Report given to RN and Post -op Vital signs reviewed and stable  Post vital signs: Reviewed and stable  Last Vitals:  Vitals Value Taken Time  BP 112/62 06/07/2018  1:38 PM  Temp 36.8 C 06/07/2018  1:38 PM  Pulse 87 06/07/2018  1:44 PM  Resp 19 06/07/2018  1:44 PM  SpO2 98 % 06/07/2018  1:44 PM  Vitals shown include unvalidated device data.  Last Pain:  Vitals:   06/07/18 1338  TempSrc: Oral  PainSc: 0-No pain      Patients Stated Pain Goal: 0 (58/30/94 0768)  Complications: No apparent anesthesia complications

## 2018-06-07 NOTE — Progress Notes (Signed)
  Echocardiogram Echocardiogram Transesophageal has been performed.  Eric Benitez 06/07/2018, 1:52 PM

## 2018-06-07 NOTE — Progress Notes (Signed)
Progress Note  Patient Name: Eric Benitez Date of Encounter: 06/07/2018  Primary Cardiologist: Carlyle Dolly, MD   Subjective   Feels well with mild chest pressure that has pretty much been continuous for the past several days.  No shortness of breath at rest.  Able to lie flat without dyspnea.  Inpatient Medications    Scheduled Meds: . apixaban  5 mg Oral Q12H  . aspirin  81 mg Oral Daily  . atorvastatin  80 mg Oral QHS  . lisinopril  2.5 mg Oral QHS  . metoprolol succinate  25 mg Oral Daily  . pantoprazole  40 mg Oral Daily  . polyethylene glycol  17 g Oral Daily  . ranolazine  500 mg Oral BID  . sodium chloride flush  3 mL Intravenous Q12H  . sodium chloride flush  3 mL Intravenous Q12H   Continuous Infusions: . sodium chloride    . sodium chloride 250 mL (06/06/18 1824)   PRN Meds: sodium chloride, acetaminophen **OR** acetaminophen, acetaminophen, albuterol, magnesium hydroxide, nitroGLYCERIN, ondansetron (ZOFRAN) IV, ondansetron **OR** [DISCONTINUED] ondansetron (ZOFRAN) IV, sodium chloride flush, sodium chloride flush, zolpidem   Vital Signs    Vitals:   06/05/18 2113 06/06/18 0534 06/06/18 2026 06/07/18 0311  BP: 127/86 123/85 113/77 (!) 144/94  Pulse: 69 79 72 97  Resp:   18 18  Temp: 97.9 F (36.6 C) 97.8 F (36.6 C) 98 F (36.7 C) 97.8 F (36.6 C)  TempSrc: Oral Oral Oral Oral  SpO2: 99% 98% 98% 97%  Weight:  72.7 kg  70.3 kg  Height:       No intake or output data in the 24 hours ending 06/07/18 0827 Last 3 Weights 06/07/2018 06/06/2018 06/05/2018  Weight (lbs) 154 lb 14.4 oz 160 lb 3.2 oz 159 lb 12.8 oz  Weight (kg) 70.262 kg 72.666 kg 72.485 kg      Telemetry    Atrial flutter with variable response, heart rate 74 bpm, no ventricular arrhythmia, no bradycardic events - Personally Reviewed   Physical Exam  Alert, oriented male in no distress GEN: No acute distress.   Neck: No JVD Cardiac:  Irregularly irregular, no murmurs, rubs, or  gallops.  Respiratory: Clear to auscultation bilaterally. GI: Soft, nontender, non-distended  MS: No edema; No deformity. Neuro:  Nonfocal  Psych: Normal affect   Labs    Chemistry Recent Labs  Lab 06/03/18 1344 06/04/18 0544 06/05/18 0539 06/07/18 0307  NA 134* 137 138 137  K 4.1 4.3 4.2 4.3  CL 102 107 106 106  CO2 23 23 23  21*  GLUCOSE 157* 109* 112* 106*  BUN 12 14 14 12   CREATININE 1.19 0.98 1.14 1.08  CALCIUM 8.9 8.7* 9.0 8.8*  PROT 7.5  --   --   --   ALBUMIN 3.9  --   --   --   AST 24  --   --   --   ALT 26  --   --   --   ALKPHOS 67  --   --   --   BILITOT 0.7  --   --   --   GFRNONAA 59* >60 >60 >60  GFRAA >60 >60 >60 >60  ANIONGAP 9 7 9 10      Hematology Recent Labs  Lab 06/05/18 0539 06/06/18 0351 06/07/18 0307  WBC 8.8 7.8 8.7  RBC 4.24 4.07* 4.15*  HGB 13.5 12.7* 13.1  HCT 39.0 37.5* 38.1*  MCV 92.0 92.1 91.8  MCH 31.8  31.2 31.6  MCHC 34.6 33.9 34.4  RDW 13.3 13.3 13.2  PLT 167 153 156    Cardiac Enzymes Recent Labs  Lab 06/03/18 1344 06/03/18 2248 06/04/18 0544  TROPONINI <0.03 <0.03 <0.03   No results for input(s): TROPIPOC in the last 168 hours.   BNPNo results for input(s): BNP, PROBNP in the last 168 hours.   DDimer No results for input(s): DDIMER in the last 168 hours.   Radiology    No results found.  Cardiac Studies   LEFT HEART CATH AND CORONARY ANGIOGRAPHY  06/05/18  Conclusion    Left dominant coronary anatomy  Left main is widely patent  LAD including the mid vessel stent is widely patent.  The ostial first diagonal contains 60 to 70% stenosis, unchanged from 1 year ago.  The circumflex territory is relatively small, contains irregularities in the mid body, and is unchanged from prior.  Right coronary contains diffuse luminal irregularities before giving origin to the PDA.  No focal or significant obstruction is noted.  Low normal LVEF, 50%.  LVEDP is normal.  RECOMMENDATIONS:   No change in coronary  anatomy.  Further management of atrial flutter.     Patient Profile     78 y.o. male with known CAD and previous stenting of the LAD, now presenting with new atrial flutter of unknown duration and New York Heart Association functional class III symptoms of dyspnea and exertional angina  Assessment & Plan    1.  Symptomatic atypical atrial flutter: CHADSVASCs score of 4. Treated with IV heparin and now transition to apixaban.  Plan for TEE cardioversion today.  2.  Coronary artery disease, native vessel, with angina: Cardiac catheterization demonstrated stable findings with continued patency of the LAD stent and moderate diagonal stenosis.  The diagonal was interrogated last year with pressure wire analysis which was negative. -Continue medical management with ASA, satin, BB and lisinopril.   For questions or updates, please contact [place] Please consult [email or web address] for contact info under     SignedLeanor Kail, [place]  06/07/2018, 8:27 AM    Patient seen, examined. Available data reviewed. Agree with findings, assessment, and plan as outlined by Robbie Lis, PA.  The patient is personally examined this morning.  The exam findings documented above reflect my personal exam of this patient.  Plan as previously outlined with TEE cardioversion today for treatment of symptomatic atypical atrial flutter.  The patient's coronary disease is stable.  I anticipate stopping aspirin at discharge and continuing him on apixaban long-term.  Anticipate discharge home late this afternoon following cardioversion as long as his rhythm is stable.  Sherren Mocha, M.D. 06/07/2018 10:04 AM

## 2018-06-07 NOTE — Anesthesia Preprocedure Evaluation (Signed)
Anesthesia Evaluation  Patient identified by MRN, date of birth, ID band Patient awake    Reviewed: Allergy & Precautions, NPO status , Patient's Chart, lab work & pertinent test results  Airway Mallampati: II  TM Distance: >3 FB Neck ROM: Full    Dental no notable dental hx.    Pulmonary neg pulmonary ROS, former smoker,    Pulmonary exam normal breath sounds clear to auscultation       Cardiovascular hypertension, Pt. on medications and Pt. on home beta blockers + CAD and + Cardiac Stents  + dysrhythmias Atrial Fibrillation  Rhythm:Irregular Rate:Normal     Neuro/Psych negative neurological ROS  negative psych ROS   GI/Hepatic Neg liver ROS, GERD  ,  Endo/Other  negative endocrine ROS  Renal/GU Renal disease  negative genitourinary   Musculoskeletal negative musculoskeletal ROS (+)   Abdominal   Peds negative pediatric ROS (+)  Hematology negative hematology ROS (+)   Anesthesia Other Findings   Reproductive/Obstetrics negative OB ROS                             Anesthesia Physical Anesthesia Plan  ASA: III  Anesthesia Plan: MAC   Post-op Pain Management:    Induction: Intravenous  PONV Risk Score and Plan: 0  Airway Management Planned: Nasal Cannula  Additional Equipment:   Intra-op Plan:   Post-operative Plan:   Informed Consent: I have reviewed the patients History and Physical, chart, labs and discussed the procedure including the risks, benefits and alternatives for the proposed anesthesia with the patient or authorized representative who has indicated his/her understanding and acceptance.     Dental advisory given  Plan Discussed with: CRNA and Surgeon  Anesthesia Plan Comments:         Anesthesia Quick Evaluation

## 2018-06-08 ENCOUNTER — Emergency Department (HOSPITAL_COMMUNITY): Payer: PPO

## 2018-06-08 ENCOUNTER — Emergency Department (HOSPITAL_COMMUNITY)
Admission: EM | Admit: 2018-06-08 | Discharge: 2018-06-09 | Disposition: A | Discharge status: Home / Self Care | Payer: PPO | Attending: Emergency Medicine | Admitting: Emergency Medicine

## 2018-06-08 ENCOUNTER — Encounter (HOSPITAL_COMMUNITY): Payer: Self-pay | Admitting: *Deleted

## 2018-06-08 ENCOUNTER — Other Ambulatory Visit: Payer: Self-pay

## 2018-06-08 DIAGNOSIS — I10 Essential (primary) hypertension: Secondary | ICD-10-CM | POA: Diagnosis not present

## 2018-06-08 DIAGNOSIS — I4430 Unspecified atrioventricular block: Secondary | ICD-10-CM | POA: Diagnosis not present

## 2018-06-08 DIAGNOSIS — Z7901 Long term (current) use of anticoagulants: Secondary | ICD-10-CM | POA: Diagnosis not present

## 2018-06-08 DIAGNOSIS — Z87891 Personal history of nicotine dependence: Secondary | ICD-10-CM | POA: Diagnosis not present

## 2018-06-08 DIAGNOSIS — R52 Pain, unspecified: Secondary | ICD-10-CM | POA: Diagnosis not present

## 2018-06-08 DIAGNOSIS — Z79899 Other long term (current) drug therapy: Secondary | ICD-10-CM | POA: Diagnosis not present

## 2018-06-08 DIAGNOSIS — R1030 Lower abdominal pain, unspecified: Secondary | ICD-10-CM

## 2018-06-08 DIAGNOSIS — K5900 Constipation, unspecified: Secondary | ICD-10-CM | POA: Insufficient documentation

## 2018-06-08 DIAGNOSIS — I2510 Atherosclerotic heart disease of native coronary artery without angina pectoris: Secondary | ICD-10-CM | POA: Insufficient documentation

## 2018-06-08 DIAGNOSIS — R1032 Left lower quadrant pain: Secondary | ICD-10-CM | POA: Diagnosis not present

## 2018-06-08 DIAGNOSIS — R140 Abdominal distension (gaseous): Secondary | ICD-10-CM | POA: Diagnosis not present

## 2018-06-08 DIAGNOSIS — R0689 Other abnormalities of breathing: Secondary | ICD-10-CM | POA: Diagnosis not present

## 2018-06-08 DIAGNOSIS — R1084 Generalized abdominal pain: Secondary | ICD-10-CM | POA: Diagnosis not present

## 2018-06-08 DIAGNOSIS — R1031 Right lower quadrant pain: Secondary | ICD-10-CM | POA: Diagnosis not present

## 2018-06-08 LAB — CULTURE, BLOOD (ROUTINE X 2)
Culture: NO GROWTH
Culture: NO GROWTH
Special Requests: ADEQUATE
Special Requests: ADEQUATE

## 2018-06-08 LAB — URINALYSIS, ROUTINE W REFLEX MICROSCOPIC
Bilirubin Urine: NEGATIVE
Glucose, UA: NEGATIVE mg/dL
Hgb urine dipstick: NEGATIVE
Ketones, ur: NEGATIVE mg/dL
Leukocytes,Ua: NEGATIVE
Nitrite: NEGATIVE
Protein, ur: NEGATIVE mg/dL
Specific Gravity, Urine: 1.009 (ref 1.005–1.030)
pH: 6 (ref 5.0–8.0)

## 2018-06-08 LAB — CBC WITH DIFFERENTIAL/PLATELET
Abs Immature Granulocytes: 0.02 10*3/uL (ref 0.00–0.07)
Basophils Absolute: 0 10*3/uL (ref 0.0–0.1)
Basophils Relative: 1 %
Eosinophils Absolute: 0.2 10*3/uL (ref 0.0–0.5)
Eosinophils Relative: 3 %
HCT: 38.8 % — ABNORMAL LOW (ref 39.0–52.0)
Hemoglobin: 12.9 g/dL — ABNORMAL LOW (ref 13.0–17.0)
Immature Granulocytes: 0 %
Lymphocytes Relative: 33 %
Lymphs Abs: 2.1 10*3/uL (ref 0.7–4.0)
MCH: 31.5 pg (ref 26.0–34.0)
MCHC: 33.2 g/dL (ref 30.0–36.0)
MCV: 94.9 fL (ref 80.0–100.0)
Monocytes Absolute: 0.9 10*3/uL (ref 0.1–1.0)
Monocytes Relative: 14 %
Neutro Abs: 3 10*3/uL (ref 1.7–7.7)
Neutrophils Relative %: 49 %
Platelets: 156 10*3/uL (ref 150–400)
RBC: 4.09 MIL/uL — ABNORMAL LOW (ref 4.22–5.81)
RDW: 13.4 % (ref 11.5–15.5)
WBC: 6.2 10*3/uL (ref 4.0–10.5)
nRBC: 0 % (ref 0.0–0.2)

## 2018-06-08 LAB — COMPREHENSIVE METABOLIC PANEL
ALT: 30 U/L (ref 0–44)
AST: 31 U/L (ref 15–41)
Albumin: 3.8 g/dL (ref 3.5–5.0)
Alkaline Phosphatase: 75 U/L (ref 38–126)
Anion gap: 6 (ref 5–15)
BUN: 19 mg/dL (ref 8–23)
CO2: 26 mmol/L (ref 22–32)
Calcium: 8.9 mg/dL (ref 8.9–10.3)
Chloride: 106 mmol/L (ref 98–111)
Creatinine, Ser: 1.09 mg/dL (ref 0.61–1.24)
GFR calc Af Amer: >60 mL/min (ref >60)
GFR calc non Af Amer: >60 mL/min (ref >60)
Glucose, Bld: 113 mg/dL — ABNORMAL HIGH (ref 70–99)
Potassium: 4.6 mmol/L (ref 3.5–5.1)
Sodium: 138 mmol/L (ref 135–145)
Total Bilirubin: 0.1 mg/dL — ABNORMAL LOW (ref 0.3–1.2)
Total Protein: 6.7 g/dL (ref 6.5–8.1)

## 2018-06-08 LAB — LIPASE, BLOOD: Lipase: 38 U/L (ref 11–51)

## 2018-06-08 MED ORDER — ONDANSETRON HCL 4 MG/2ML IJ SOLN
4.0000 mg | Freq: Once | INTRAMUSCULAR | Status: AC
  Administered 2018-06-08: 22:00:00 4 mg via INTRAVENOUS
  Filled 2018-06-08: qty 2

## 2018-06-08 MED ORDER — ONDANSETRON HCL 4 MG/2ML IJ SOLN
4.0000 mg | Freq: Once | INTRAMUSCULAR | Status: AC
  Administered 2018-06-08: 23:00:00 4 mg via INTRAVENOUS
  Filled 2018-06-08: qty 2

## 2018-06-08 MED ORDER — MORPHINE SULFATE (PF) 4 MG/ML IV SOLN
4.0000 mg | Freq: Once | INTRAVENOUS | Status: AC
  Administered 2018-06-08: 4 mg via INTRAVENOUS
  Filled 2018-06-08: qty 1

## 2018-06-08 MED ORDER — MORPHINE SULFATE (PF) 4 MG/ML IV SOLN
4.0000 mg | Freq: Once | INTRAVENOUS | Status: AC
  Administered 2018-06-08: 23:00:00 4 mg via INTRAVENOUS
  Filled 2018-06-08: qty 1

## 2018-06-08 MED ORDER — IOHEXOL 300 MG/ML  SOLN
100.0000 mL | Freq: Once | INTRAMUSCULAR | Status: AC | PRN
  Administered 2018-06-09: 100 mL via INTRAVENOUS

## 2018-06-08 NOTE — ED Triage Notes (Signed)
Pt c/o constipation and abd pain, recently passed kidney stone two days ago as well, last bowel movement was 5 days ago,

## 2018-06-08 NOTE — ED Provider Notes (Signed)
[facility] EMERGENCY DEPARTMENT Provider Note   CSN: [id number] Arrival date & time: 06/08/18  2031    History   Chief Complaint Chief Complaint  Patient presents with   Abdominal Pain    HPI Eric Benitez is a 78 y.o. male with a history of HTN, kidney stones (reports passed a stone 2 nights ago) CAD and new onset atrial flutter which was resolved by cardioversion 2 days ago during admission at [facility]. Of note, he also had a cardiac cath during this weeks admission, access site right forearm.  He was discharged home today and felt well until he developed right lower abdominal pain which radiates into his upper abdomen but also into his right upper anterior thigh which started this evening. His pain is better at rest, severe with movement, especially raising his right leg (when lying) and walking.   He reports constipation, stating his last significant bm was 5 days ago, has passed minimal soft stool tonight after using 3 rectal suppositories and also using an oral medication. He was treated with miralax while in the hospital as well.  He denies rectal pain, scrotal pain,  fevers or chills, no n/v, no dysuria or hematuria, no flank or back pain.     The history is provided by the patient.    Past Medical History:  Diagnosis Date   Arthritis    "neck, hands, fingers" (04/09/2014)   Bleeds easily ([place])    CAD (coronary artery disease)    a. s/p prior stenting of LAD b.  patent stent by cath in 2016 and 06/2017 --> residual 60% D1 stenosis and moderate disease along the RCA   Hyperlipemia    Hypertension    Kidney stones    "I've had several; had to go in and get them once" (04/09/2014)   Pneumonia 1940's    Patient Active Problem List   Diagnosis Date Noted   Typical atrial flutter ([place])    Chest pain 06/05/2018   New onset atrial flutter (HCC)    RLL pneumonia ([place]) 06/04/2018   PUD (peptic ulcer disease) 06/04/2018   Angina pectoris ([place]) 06/04/2018   Coronary  artery disease 06/04/2018   Chest heaviness 06/03/2018   Melena 05/28/2018   History of colonic polyps 07/23/2017   GERD (gastroesophageal reflux disease) 12/26/2016   Constipation 12/26/2016   Heme + stool 12/26/2016   Unstable angina ([place]) 04/09/2014   Essential hypertension 04/09/2014   Hyperlipidemia with target LDL less than 70 04/09/2014   Chest pain with low risk of acute coronary syndrome 04/09/2014   Pain in the chest    CAD S/P percutaneous coronary angioplasty - PCI to LAD in 2006 04/27/2004    Past Surgical History:  Procedure Laterality Date   ANKLE FRACTURE SURGERY Right 2009   CARDIAC CATHETERIZATION  1990's X 2;  04/09/2014   COLONOSCOPY N/A 01/22/2017   Dr. Oneida Alar: 6 tubular adenomas removed, external hemorrhoids.  Recommended next exam in 3 years.   CORONARY ANGIOPLASTY WITH STENT PLACEMENT  2006   "2"   CYSTOSCOPY W/ STONE MANIPULATION  ~ 2008   ESOPHAGOGASTRODUODENOSCOPY N/A 01/22/2017   Dr. Oneida Alar: Mild to moderate gastritis.  No H. pylori   ESOPHAGOGASTRODUODENOSCOPY N/A 05/31/2018   Procedure: ESOPHAGOGASTRODUODENOSCOPY (EGD);  Surgeon: Danie Binder, MD;  [facility];  Service: Endoscopy;  Laterality: N/A;  8:30am   FRACTURE SURGERY     INTRAVASCULAR PRESSURE WIRE/FFR STUDY N/A 06/08/2017   Procedure: INTRAVASCULAR PRESSURE WIRE/FFR STUDY;  Surgeon: Nelva Bush,  MD;  Location: [place] [facility] LAB;  Service: Cardiovascular;  Laterality: N/A;   LEFT HEART CATH AND CORONARY ANGIOGRAPHY N/A 06/08/2017   Procedure: LEFT HEART CATH AND CORONARY ANGIOGRAPHY;  Surgeon: Nelva Bush, MD;  Location: [place] [facility] LAB;  Service: Cardiovascular;  Laterality: N/A;   LEFT HEART CATH AND CORONARY ANGIOGRAPHY N/A 06/05/2018   Procedure: LEFT HEART CATH AND CORONARY ANGIOGRAPHY;  Surgeon: Belva Crome, MD;  Location: [place] [facility];  Service: Cardiovascular;  Laterality: N/A;   LEFT HEART CATHETERIZATION WITH CORONARY ANGIOGRAM  N/A 04/09/2014   Procedure: LEFT HEART CATHETERIZATION WITH CORONARY ANGIOGRAM;  Surgeon: Leonie Man, MD;  Location: [facility] CATH LAB;  Service: Cardiovascular;  Laterality: N/A;   NASAL SEPTUM SURGERY  1999   POLYPECTOMY  01/22/2017   Procedure: POLYPECTOMY;  Surgeon: Danie Binder, MD;  Location: [facility];  Service: Endoscopy;;  right colon x4        Home Medications    Prior to Admission medications   Medication Sig Start Date End Date Taking? Authorizing Provider  albuterol (VENTOLIN HFA) 108 (90 Base) MCG/ACT inhaler Inhale 2 puffs into the lungs every 6 (six) hours as needed for wheezing or shortness of breath.    [provider]  apixaban (ELIQUIS) 5 MG TABS tablet Take 1 tablet (5 mg total) by mouth every 12 (twelve) hours. 06/07/18   Bhagat, Crista Luria, PA  atorvastatin (LIPITOR) 80 MG tablet Take 80 mg by mouth at bedtime.     [provider]  diphenhydramine-acetaminophen (TYLENOL PM) 25-500 MG TABS tablet Take 2 tablets by mouth at bedtime.     [provider]  doxazosin (CARDURA) 8 MG tablet Take 8 mg by mouth at bedtime.     [provider]  Fluticasone Propionate (ALLERGY RELIEF NA) Place 2 sprays into the nose daily as needed (allergies).    [provider]  Hypromellose (ARTIFICIAL TEARS OP) Place 1-2 drops into both eyes daily as needed (for dry eyes).    [provider]  lisinopril (PRINIVIL,ZESTRIL) 2.5 MG tablet TAKE 1 TABLET BY MOUTH EVERY DAY Patient taking differently: Take 2.5 mg by mouth at bedtime.  10/31/17   Arnoldo Lenis, MD  loratadine (CLARITIN) 10 MG tablet Take 10 mg by mouth daily.    [provider]  metoprolol succinate (TOPROL-XL) 25 MG 24 hr tablet Take 25 mg by mouth daily.     [provider]  nitroGLYCERIN (NITROSTAT) 0.4 MG SL tablet PLACE 1 TABLET (0.4 MG TOTAL) UNDER THE TONGUE EVERY 5 (FIVE) MINUTES AS NEEDED FOR CHEST PAIN. 06/29/17   Arnoldo Lenis, MD    pantoprazole (PROTONIX) 40 MG tablet TAKE 1 TABLET BY MOUTH DAILY Patient taking differently: Take 40 mg by mouth daily 11/10/15   Arnoldo Lenis, MD  ranolazine (RANEXA) 500 MG 12 hr tablet Take 500 mg by mouth 2 (two) times daily.  12/31/17   [provider]  senna (SENOKOT) 8.6 MG tablet Take 3 tablets by mouth at bedtime.     [provider]  vitamin B-12 (CYANOCOBALAMIN) 1000 MCG tablet Take 1,000 mcg by mouth daily.    [provider]    Family History Family History  Problem Relation Age of Onset   Heart disease Mother    Colon cancer Neg Hx    Gastric cancer Neg Hx    Esophageal cancer Neg Hx     Social History Social History   Tobacco Use   Smoking status:  Former Smoker    Packs/day: 1.50    Years: 22.00    Pack years: 33.00    Types: Cigarettes    Start date: 11/28/1942    Last attempt to quit: 02/07/1964    Years since quitting: 54.3   Smokeless tobacco: Never Used  Substance Use Topics   Alcohol use: No    Alcohol/week: 0.0 standard drinks    Frequency: Never   Drug use: No     Allergies   Vancomycin   Review of Systems Review of Systems  Constitutional: Negative for fever.  HENT: Negative for congestion and sore throat.   Eyes: Negative.   Respiratory: Negative for chest tightness and shortness of breath.   Cardiovascular: Negative for chest pain.  Gastrointestinal: Positive for abdominal distention, abdominal pain and constipation. Negative for blood in stool, diarrhea, nausea, rectal pain and vomiting.  Genitourinary: Negative.   Musculoskeletal: Negative for arthralgias, joint swelling and neck pain.  Skin: Negative.  Negative for rash and wound.  Neurological: Negative for dizziness, weakness, light-headedness, numbness and headaches.  Psychiatric/Behavioral: Negative.      Physical Exam Updated Vital Signs BP 126/74    Pulse 89    Temp 98.7 F (37.1 C)    Resp 18    Ht 5\' 9"  (1.753 m)    Wt 70.2 kg     SpO2 96%    BMI 22.85 kg/m   Physical Exam Vitals signs and nursing note reviewed.  Constitutional:      Appearance: He is well-developed.  HENT:     Head: Normocephalic and atraumatic.  Eyes:     Conjunctiva/sclera: Conjunctivae normal.  Neck:     Musculoskeletal: Normal range of motion.  Cardiovascular:     Rate and Rhythm: Normal rate and regular rhythm.     Heart sounds: Normal heart sounds.  Pulmonary:     Effort: Pulmonary effort is normal.     Breath sounds: Normal breath sounds. No wheezing.  Abdominal:     General: Bowel sounds are normal. There is distension.     Palpations: Abdomen is soft.     Tenderness: There is abdominal tenderness in the right lower quadrant. There is guarding. There is no rebound.     Hernia: No hernia is present.     Comments: No increased tympany.   Musculoskeletal: Normal range of motion.  Skin:    General: Skin is warm and dry.  Neurological:     Mental Status: He is alert.      ED Treatments / Results  Labs (all labs ordered are listed, but only abnormal results are displayed) Labs Reviewed  COMPREHENSIVE METABOLIC PANEL - Abnormal; Notable for the following components:      Result Value   Glucose, Bld 113 (*)    Total Bilirubin 0.1 (*)    All other components within normal limits  CBC WITH DIFFERENTIAL/PLATELET - Abnormal; Notable for the following components:   RBC 4.09 (*)    Hemoglobin 12.9 (*)    HCT 38.8 (*)    All other components within normal limits  LIPASE, BLOOD  URINALYSIS, ROUTINE W REFLEX MICROSCOPIC    EKG None  Radiology Dg Abd Acute 2+v W 1v Chest  Result Date: 06/08/2018 CLINICAL DATA:  Bilateral lower abdominal pain EXAM: DG ABDOMEN ACUTE W/ 1V CHEST COMPARISON:  06/03/2018 FINDINGS: Cardiomegaly. Chronic interstitial prominence and areas of scarring in the mid and lower lungs. No acute airspace opacities or effusions. Nonobstructive bowel gas pattern. Moderate stool within the colon.  No organomegaly or  free air. No acute bony abnormality. IMPRESSION: No evidence of bowel obstruction or free air. Moderate stool burden. Chronic interstitial lung disease/fibrosis in the mid and lower lungs. No acute cardiopulmonary disease. Electronically Signed   By: Rolm Baptise M.D.   On: 06/08/2018 22:56    Procedures Procedures (including critical care time)  Medications Ordered in ED Medications  iohexol (OMNIPAQUE) 300 MG/ML solution 100 mL (has no administration in time range)  morphine 4 MG/ML injection 4 mg (4 mg Intravenous Given 06/08/18 2208)  ondansetron (ZOFRAN) injection 4 mg (4 mg Intravenous Given 06/08/18 2208)  morphine 4 MG/ML injection 4 mg (4 mg Intravenous Given 06/08/18 2322)  ondansetron (ZOFRAN) injection 4 mg (4 mg Intravenous Given 06/08/18 2322)     Initial Impression / Assessment and Plan / ED Course  I have reviewed the triage vital signs and the nursing notes.  Pertinent labs & imaging results that were available during my care of the patient were reviewed by me and considered in my medical decision making (see chart for details).        Labs and acute abd series unrevealing for source of sx, moderate constipation but does not fully explain severity of pt's pain which is localized to RLQ.  CT imaging ordered.  Discussed with Dr. Betsey Holiday who will follow pt.  Final Clinical Impressions(s) / ED Diagnoses   Final diagnoses:  Lower abdominal pain    ED Discharge Orders    None       Landis Martins 06/08/18 2359    Orpah Greek, MD 06/09/18 0132    Orpah Greek, MD 06/09/18 0246    Orpah Greek, MD 06/09/18 [phone]

## 2018-06-08 NOTE — ED Notes (Signed)
Patient transported to X-ray 

## 2018-06-09 MED ORDER — MAGNESIUM CITRATE PO SOLN
0.5000 | Freq: Once | ORAL | Status: AC
  Administered 2018-06-09: 0.5 via ORAL
  Filled 2018-06-09: qty 296

## 2018-06-09 MED ORDER — LACTULOSE 10 GM/15ML PO SOLN: 10.0000 g | Freq: Two times a day (BID) | ORAL | 0 refills | Status: DC | PRN

## 2018-06-09 MED ORDER — DICYCLOMINE HCL 10 MG PO CAPS: 10.0000 mg | ORAL_CAPSULE | Freq: Three times a day (TID) | ORAL | 0 refills | Status: DC | PRN

## 2018-06-09 MED ORDER — HYOSCYAMINE SULFATE 0.125 MG PO TABS
0.1250 mg | ORAL_TABLET | Freq: Once | ORAL | Status: AC
  Administered 2018-06-09: 0.125 mg via ORAL
  Filled 2018-06-09: qty 1

## 2018-06-09 MED ORDER — SODIUM CHLORIDE 0.9 % IV BOLUS
500.0000 mL | Freq: Once | INTRAVENOUS | Status: AC
  Administered 2018-06-09: 500 mL via INTRAVENOUS

## 2018-06-09 MED ORDER — MAGNESIUM CITRATE PO SOLN: 1.0000 | Freq: Once | ORAL | 0 refills | Status: AC

## 2018-06-09 MED ORDER — OXYCODONE-ACETAMINOPHEN 5-325 MG PO TABS
1.0000 | ORAL_TABLET | Freq: Once | ORAL | Status: AC
  Administered 2018-06-09: 1 via ORAL
  Filled 2018-06-09: qty 1

## 2018-06-09 MED ORDER — MORPHINE SULFATE (PF) 4 MG/ML IV SOLN
4.0000 mg | Freq: Once | INTRAVENOUS | Status: AC
  Administered 2018-06-09: 4 mg via INTRAVENOUS
  Filled 2018-06-09: qty 1

## 2018-06-09 NOTE — ED Notes (Signed)
Enema complete

## 2018-06-09 NOTE — ED Notes (Signed)
Patient transported to CT 

## 2018-06-09 NOTE — ED Notes (Signed)
Patient states he is having pain in the groin area and bilateral legs along with his abdominal discomfort.

## 2018-06-09 NOTE — ED Notes (Signed)
Patient had second bowel movement . Patient states he is feeling better.

## 2018-06-09 NOTE — ED Notes (Signed)
Patient had good results with enema. Patient states that he feels a little better.

## 2018-06-09 NOTE — ED Notes (Signed)
Pt up to bedside commode

## 2018-06-10 ENCOUNTER — Encounter (HOSPITAL_COMMUNITY): Payer: Self-pay | Admitting: Internal Medicine

## 2018-06-26 ENCOUNTER — Encounter: Payer: Self-pay | Admitting: *Deleted

## 2018-06-26 ENCOUNTER — Other Ambulatory Visit: Payer: Self-pay | Admitting: *Deleted

## 2018-06-26 NOTE — Patient Outreach (Signed)
High Risk HTA Patient Screening. Pt has had 3 trips to the hospital in one month: 1)Procedure Melena - EGD revealed gastritis and was treated 2) Admission Atrial Flutter - EEG, cardioversion and cardiac cath - converted to NSR, no change in cath findings. 3) ED only:  Abdominal pain - constipation was treated and discharged.  Called and completed an initial assessment.  Pt reports he is doing fine since his last ED visit (abdominal pain related to constipation) having regular BMs daily and taking his meds/OTCs to prevent abd pain and constipation.  His gastritis sxs have been releved with tx: Protonix. He has not observed any blood in his stools.  He denies any further notice of heart irregularity.  Outpatient Encounter Medications as of 06/26/2018  Medication Sig  . albuterol (VENTOLIN HFA) 108 (90 Base) MCG/ACT inhaler Inhale 2 puffs into the lungs every 6 (six) hours as needed for wheezing or shortness of breath.  Marland Kitchen apixaban (ELIQUIS) 5 MG TABS tablet Take 1 tablet (5 mg total) by mouth every 12 (twelve) hours.  Marland Kitchen atorvastatin (LIPITOR) 80 MG tablet Take 80 mg by mouth at bedtime.   . dicyclomine (BENTYL) 10 MG capsule Take 1 capsule (10 mg total) by mouth 3 (three) times daily as needed (abdominal pain).  Marland Kitchen diphenhydramine-acetaminophen (TYLENOL PM) 25-500 MG TABS tablet Take 2 tablets by mouth at bedtime.   Marland Kitchen doxazosin (CARDURA) 8 MG tablet Take 8 mg by mouth at bedtime.   . Fluticasone Propionate (ALLERGY RELIEF NA) Place 2 sprays into the nose daily as needed (allergies).  . Hypromellose (ARTIFICIAL TEARS OP) Place 1-2 drops into both eyes daily as needed (for dry eyes).  Marland Kitchen lactulose (CHRONULAC) 10 GM/15ML solution Take 15 mLs (10 g total) by mouth 2 (two) times daily as needed for moderate constipation.  Marland Kitchen lisinopril (PRINIVIL,ZESTRIL) 2.5 MG tablet TAKE 1 TABLET BY MOUTH EVERY DAY (Patient taking differently: Take 2.5 mg by mouth at bedtime. )  . loratadine (CLARITIN) 10 MG tablet  Take 10 mg by mouth daily.  . metoprolol succinate (TOPROL-XL) 25 MG 24 hr tablet Take 25 mg by mouth daily.   . pantoprazole (PROTONIX) 40 MG tablet TAKE 1 TABLET BY MOUTH DAILY (Patient taking differently: Take 40 mg by mouth daily)  . ranolazine (RANEXA) 500 MG 12 hr tablet Take 500 mg by mouth 2 (two) times daily.   Marland Kitchen senna (SENOKOT) 8.6 MG tablet Take 3 tablets by mouth at bedtime.   . vitamin B-12 (CYANOCOBALAMIN) 1000 MCG tablet Take 1,000 mcg by mouth daily.  . nitroGLYCERIN (NITROSTAT) 0.4 MG SL tablet PLACE 1 TABLET (0.4 MG TOTAL) UNDER THE TONGUE EVERY 5 (FIVE) MINUTES AS NEEDED FOR CHEST PAIN. (Patient not taking: Reported on 06/26/2018)   No facility-administered encounter medications on file as of 06/26/2018.    Fall Risk  06/26/2018  Falls in the past year? 0  Number falls in past yr: 0  Injury with Fall? 0  Follow up Falls evaluation completed   Depression screen [facility] 2/9 06/26/2018  Decreased Interest 0  Down, Depressed, Hopeless 0  PHQ - 2 Score 0   THN CM Care Plan Problem One     Most Recent Value  Care Plan Problem One  Multiple trips to the hospiital in one month, different causes.  Role Documenting the Problem One  Care Management [place] for Problem One  Active  THN Long Term Goal   Pt agrees to participate in everyother week phone calls for general wellbeing  and health education for the next 6 weeks.  THN Long Term Goal Start Date  06/26/18  Interventions for Problem One Long Term Goal  Provided information about [facility] services and goals, disease management, improve quality of life, reduce hospitalizations and ensure pt safety.  THN CM Short Term Goal #1   Pt will not readmit over the next 30 days for any cause.  THN CM Short Term Goal #1 Start Date  06/26/18  Interventions for Short Term Goal #1  Advised pt to call me with any problems or issues. Encouraged early intervention to prevent complications.  THN CM Short Term Goal #2   Pt will report self  administration of medication on ordered schedule over next 30 days.  Interventions for Short Term Goal #2  Medication reconcilliation done. No questions regarding meds. Pt able to afford. Prefers no med box.    [facility] CM Care Plan Problem Two     Most Recent Value  Care Plan Problem Two  New dx: Atrial Flutter  Role Documenting the Problem Two  Care Management Coordinator  Care Plan for Problem Two  Active  Interventions for Problem Two Long Term Goal   Identified new medication (Eliquis), pt already on metoprolol. Pt knows about action of Eliquis, not what the metroprolol action is.  THN Long Term Goal  Pt will be able to discuss medications related to new dx: what they are and what they do over the next   [facility] Long Term Goal Start Date  06/26/18  THN CM Short Term Goal #1   Pt will report following heart healthy diet and increased activity over the next 30 days.  THN CM Short Term Goal #1 Start Date  06/26/18  Interventions for Short Term Goal #2   Discussed diet and activity needs. Encouraged healthy eating and increasing walking time.    [facility] CM Care Plan Problem Three     Most Recent Value  Care Plan Problem Three  COVID19 Education   Role Documenting the Problem Three  Care Management Coordinator  Care Plan for Problem Three  Active  THN CM Short Term Goal #1   Will discuss and report actions to prevent this infection.  THN CM Short Term Goal #1 Start Date  06/26/18  Interventions for Short Term Goal #1  Discussed general guidelines: safe distancing, wearing mask, and hand washing.     I will call Eric Benitez in 2 weeks as agreed. He has been instructed to call me with any problems.  Eulah Pont. Myrtie Neither, MSN, [facility] Gerontological Nurse Practitioner [facility] Care Management [phone]

## 2018-07-06 DIAGNOSIS — E7800 Pure hypercholesterolemia, unspecified: Secondary | ICD-10-CM | POA: Diagnosis not present

## 2018-07-06 DIAGNOSIS — I10 Essential (primary) hypertension: Secondary | ICD-10-CM | POA: Diagnosis not present

## 2018-07-07 DIAGNOSIS — I4891 Unspecified atrial fibrillation: Secondary | ICD-10-CM | POA: Diagnosis not present

## 2018-07-08 ENCOUNTER — Telehealth: Payer: Self-pay | Admitting: Cardiology

## 2018-07-08 NOTE — Telephone Encounter (Signed)
Bad headaches, back of neck hurting x 2 weeks - getting worse.  SOB x 2 weeks.  No chest pain.  Does note some dizziness.  Did see pmd 2 wks ago, but these symptoms had just started.    Last pm - 127/68  69

## 2018-07-08 NOTE — Telephone Encounter (Signed)
Patient wife Joycelyn Schmid) notified.

## 2018-07-08 NOTE — Telephone Encounter (Signed)
Wife called stating that patient is having shortness of breath, headache, aching pain in neck. [phone])

## 2018-07-08 NOTE — Telephone Encounter (Signed)
Left message to return call 

## 2018-07-08 NOTE — Telephone Encounter (Signed)
Can I see him in [place] at 1030AM tomorrow, if severe symptoms needs to go to ER today   Zandra Abts MD

## 2018-07-09 ENCOUNTER — Ambulatory Visit (INDEPENDENT_AMBULATORY_CARE_PROVIDER_SITE_OTHER): Payer: PPO | Admitting: Cardiology

## 2018-07-09 ENCOUNTER — Encounter: Payer: Self-pay | Admitting: *Deleted

## 2018-07-09 ENCOUNTER — Encounter: Payer: Self-pay | Admitting: Cardiology

## 2018-07-09 VITALS — BP 164/87 | HR 74 | Temp 97.0°F | Ht 69.0 in | Wt 167.0 lb

## 2018-07-09 DIAGNOSIS — I4892 Unspecified atrial flutter: Secondary | ICD-10-CM | POA: Diagnosis not present

## 2018-07-09 DIAGNOSIS — R5383 Other fatigue: Secondary | ICD-10-CM

## 2018-07-09 NOTE — Patient Instructions (Addendum)
Medication Instructions:  Continue all current medications.  Labwork:  CMET, CBC, TSH   Patient will do labs at [facility] this Friday   Office will fax orders today   Office will contact with results via phone or letter.    Testing/Procedures: Your physician has recommended that you wear a 7 day event monitor. Event monitors are medical devices that record the heart's electrical activity. Doctors most often [place] these monitors to diagnose arrhythmias. Arrhythmias are problems with the speed or rhythm of the heartbeat. The monitor is a small, portable device. You can wear one while you do your normal daily activities. This is usually used to diagnose what is causing palpitations/syncope (passing out). Office will contact with results via phone or letter.    Follow-Up: 3 months   Any Other Special Instructions Will Be Listed Below (If Applicable).  If you need a refill on your cardiac medications before your next appointment, please call your pharmacy.

## 2018-07-09 NOTE — Progress Notes (Signed)
Clinical Summary Eric Benitez is a 78 y.o.male seen today for follow up of the following medical problems.This is a focused visit on recent issues with genralized fatigue, for more detaled history please refer to prior notes.    1. CAD - hx of prior stenting 10 years ago - cath 04/09/14 with patent coronaries.  06/2017 cath patent vessels. LVEDP 6.  - no recent chest pain. No SOB/DOE - compliant with meds - starting at [employer], riding stationary bike x 20 minutes without troubles.  - 05/2018 during admission had cath - 05/2018 cath: patent vessels and stents, ostial D1 60-70% stenosis  - no recent chest pain  2. SOB - 10/2015 PFTs mild ventilatory defect -05/2017 echo LVEF 60-65%, no WMAs, diastolic function not described.  - 05/2017 lexiscan small apical ischemia, low risk - 06/2017 cath patent vessels. LVEDP 6.  - no significant SOB/DOE   3. Atrial flutter - recent admission 05/2018 with new onset aflutter. Symptoms were recurring chest tightness, neck discomfort, fatigue. DId not have significant palpitations.  - s/p TEE/DCCV that admission.   - no recent palpitatoins.    4. Generalized fatigue - generalized fatigue, leg weakness. Headache back of head most at night.  - no chest pain. No recent palpitations.  - seen by pcp recently.     Past Medical History:  Diagnosis Date  . Arthritis    "neck, hands, fingers" (04/09/2014)  . Bleeds easily ([place])   . CAD (coronary artery disease)    a. s/p prior stenting of LAD b.  patent stent by cath in 2016 and 06/2017 --> residual 60% D1 stenosis and moderate disease along the RCA  . Hyperlipemia   . Hypertension   . Kidney stones    "I've had several; had to go in and get them once" (04/09/2014)  . Pneumonia 1940's     Allergies  Allergen Reactions  . Vancomycin Rash     Current Outpatient Medications  Medication Sig Dispense Refill  . albuterol (VENTOLIN HFA) 108 (90 Base) MCG/ACT inhaler Inhale  2 puffs into the lungs every 6 (six) hours as needed for wheezing or shortness of breath.    Marland Kitchen apixaban (ELIQUIS) 5 MG TABS tablet Take 1 tablet (5 mg total) by mouth every 12 (twelve) hours. 60 tablet 11  . atorvastatin (LIPITOR) 80 MG tablet Take 80 mg by mouth at bedtime.     . dicyclomine (BENTYL) 10 MG capsule Take 1 capsule (10 mg total) by mouth 3 (three) times daily as needed (abdominal pain). 30 capsule 0  . diphenhydramine-acetaminophen (TYLENOL PM) 25-500 MG TABS tablet Take 2 tablets by mouth at bedtime.     Marland Kitchen doxazosin (CARDURA) 8 MG tablet Take 8 mg by mouth at bedtime.     . Fluticasone Propionate (ALLERGY RELIEF NA) Place 2 sprays into the nose daily as needed (allergies).    . Hypromellose (ARTIFICIAL TEARS OP) Place 1-2 drops into both eyes daily as needed (for dry eyes).    Marland Kitchen lactulose (CHRONULAC) 10 GM/15ML solution Take 15 mLs (10 g total) by mouth 2 (two) times daily as needed for moderate constipation. 236 mL 0  . lisinopril (PRINIVIL,ZESTRIL) 2.5 MG tablet TAKE 1 TABLET BY MOUTH EVERY DAY (Patient taking differently: Take 2.5 mg by mouth at bedtime. ) 90 tablet 1  . loratadine (CLARITIN) 10 MG tablet Take 10 mg by mouth daily.    . metoprolol succinate (TOPROL-XL) 25 MG 24 hr tablet Take 25 mg by mouth daily.     Marland Kitchen  nitroGLYCERIN (NITROSTAT) 0.4 MG SL tablet PLACE 1 TABLET (0.4 MG TOTAL) UNDER THE TONGUE EVERY 5 (FIVE) MINUTES AS NEEDED FOR CHEST PAIN. (Patient not taking: Reported on 06/26/2018) 25 tablet 3  . pantoprazole (PROTONIX) 40 MG tablet TAKE 1 TABLET BY MOUTH DAILY (Patient taking differently: Take 40 mg by mouth daily) 30 tablet 6  . ranolazine (RANEXA) 500 MG 12 hr tablet Take 500 mg by mouth 2 (two) times daily.     Marland Kitchen senna (SENOKOT) 8.6 MG tablet Take 3 tablets by mouth at bedtime.     . vitamin B-12 (CYANOCOBALAMIN) 1000 MCG tablet Take 1,000 mcg by mouth daily.     No current facility-administered medications for this visit.      Past Surgical History:   Procedure Laterality Date  . ANKLE FRACTURE SURGERY Right 2009  . CARDIAC CATHETERIZATION  1990's X 2;  04/09/2014  . CARDIOVERSION N/A 06/07/2018   Procedure: CARDIOVERSION;  Surgeon: Jolaine Artist, MD;  Location: [facility] ENDOSCOPY;  Service: Cardiovascular;  Laterality: N/A;  . COLONOSCOPY N/A 01/22/2017   Dr. fields: 6 tubular adenomas removed, external hemorrhoids.  Recommended next exam in 3 years.  . CORONARY ANGIOPLASTY WITH STENT PLACEMENT  2006   "2"  . CYSTOSCOPY W/ STONE MANIPULATION  ~ 2008  . ESOPHAGOGASTRODUODENOSCOPY N/A 01/22/2017   Dr. Oneida Alar: Mild to moderate gastritis.  No H. pylori  . ESOPHAGOGASTRODUODENOSCOPY N/A 05/31/2018   Procedure: ESOPHAGOGASTRODUODENOSCOPY (EGD);  Surgeon: Danie Binder, MD;  [facility] END[facility];  Service: Endoscopy;  Laterality: N/A;  8:30am  . FRACTURE SURGERY    . INTRAVASCULAR PRESSURE WIRE/FFR STUDY N/A 06/08/2017   Procedure: INTRAVASCULAR PRESSURE WIRE/FFR STUDY;  Surgeon: Nelva Bush, MD;  Location: [place] CV LAB;  Service: Cardiovascular;  Laterality: N/A;  . LEFT HEART CATH AND CORONARY ANGIOGRAPHY N/A 06/08/2017   Procedure: LEFT HEART CATH AND CORONARY ANGIOGRAPHY;  Surgeon: Nelva Bush, MD;  Location: [place] CV LAB;  Service: Cardiovascular;  Laterality: N/A;  . LEFT HEART CATH AND CORONARY ANGIOGRAPHY N/A 06/05/2018   Procedure: LEFT HEART CATH AND CORONARY ANGIOGRAPHY;  Surgeon: Belva Crome, MD;  Location: [place] CV LAB;  Service: Cardiovascular;  Laterality: N/A;  . LEFT HEART CATHETERIZATION WITH CORONARY ANGIOGRAM N/A 04/09/2014   Procedure: LEFT HEART CATHETERIZATION WITH CORONARY ANGIOGRAM;  Surgeon: Leonie Man, MD;  Location: [facility] CATH LAB;  Service: Cardiovascular;  Laterality: N/A;  . NASAL SEPTUM SURGERY  1999  . POLYPECTOMY  01/22/2017   Procedure: POLYPECTOMY;  Surgeon: Danie Binder, MD;  Location: [facility]O [facility];  Service: Endoscopy;;  right colon x4  . TEE WITHOUT CARDIOVERSION N/A  06/07/2018   Procedure: TRANSESOPHAGEAL ECHOCARDIOGRAM (TEE);  Surgeon: Jolaine Artist, MD;  Location: [facility] ENDOSCOPY;  Service: Cardiovascular;  Laterality: N/A;     Allergies  Allergen Reactions  . Vancomycin Rash      Family History  Problem Relation Age of Onset  . Heart disease Mother   . Colon cancer Neg Hx   . Gastric cancer Neg Hx   . Esophageal cancer Neg Hx      Social History Mr. Pettet reports that he quit smoking about 54 years ago. His smoking use included cigarettes. He started smoking about 75 years ago. He has a 33.00 pack-year smoking history. He has never used smokeless tobacco. Mr. Melnyk reports no history of alcohol use.   Review of Systems CONSTITUTIONAL: per hpi HEENT: Eyes: No visual loss, blurred vision, double vision or yellow sclerae.No hearing loss,  sneezing, congestion, runny nose or sore throat.  SKIN: No rash or itching.  CARDIOVASCULAR: per hpi RESPIRATORY: No shortness of breath, cough or sputum.  GASTROINTESTINAL: No anorexia, nausea, vomiting or diarrhea. No abdominal pain or blood.  GENITOURINARY: No burning on urination, no polyuria NEUROLOGICAL: No headache, dizziness, syncope, paralysis, ataxia, numbness or tingling in the extremities. No change in bowel or bladder control.  MUSCULOSKELETAL: No muscle, back pain, joint pain or stiffness.  LYMPHATICS: No enlarged nodes. No history of splenectomy.  PSYCHIATRIC: No history of depression or anxiety.  ENDOCRINOLOGIC: No reports of sweating, cold or heat intolerance. No polyuria or polydipsia.  Marland Kitchen   Physical Examination Today's Vitals   07/09/18 1030  BP: (!) 164/87  Pulse: 74  Temp: (!) 97 F (36.1 C)  TempSrc: Temporal  SpO2: 99%  Weight: 167 lb (75.8 kg)  Height: 5\' 9"  (1.753 m)   Body mass index is 24.66 kg/m.  Gen: resting comfortably, no acute distress HEENT: no scleral icterus, pupils equal round and reactive, no palptable cervical adenopathy,  CV: RRR, no m/r/g, no vjd  Resp: Clear to auscultation bilaterally GI: abdomen is soft, non-tender, non-distended, normal bowel sounds, no hepatosplenomegaly MSK: extremities are warm, no edema.  Skin: warm, no rash Neuro:  no focal deficits Psych: appropriate affect   Diagnostic Studies  04/09/14 Cath Hemodynamics:  Central Aortic Pressure / Mean: 126/67/92 mmHg  Left Ventricular Pressure / LVEDP: 128/10/16 mmHg  Left Ventriculography:  EF:50-65 %  Wall Motion:Normal to hyperdynamic  Coronary Anatomy:  Dominance: Co-dominant  Left Main: Large-caliber vessel that is short. Bifurcates into the LAD and Circumflex. Angiographically normal. NTI:[id number] vessel with a very large proximal D1 branch that comes off just prior to a widely patent stent in the early mid vessel. There is a small second diagonal branch that arises from the stented segment and another small third diagonal branch more distally. The distal vessel tapers into a moderate caliber vessel that wraps around the apex perfusing the distal third of the inferoapex.  D1:Large-caliber proximal branch with ostial 40-50% focal stenosis. The vessel then courses almost of the ramus intermedius. The vessel is very tortuous gives rise to several mold-moderate caliber branches distally. Beyond the ostial lesion, the vessel remains angiographically normal.  Left Circumflex:Moderate large-caliber, codominant vessel. There is a small moderate caliber first obtuse marginal branch before the vessel courses into the AV groove. In the mid AV groove there is a hairpin turn as the vessel courses distally to provide to posterior lateral branches with the first being moderate caliber and second mean small caliber.  OM1:Small moderate caliber branch that does not cover large distribution. Tortuous but free of disease.   RCA: Moderate large-caliber, codominant vessel. Minimal luminal irregularities are vessel tapers down to [place] more moderate caliber  right posterior descending arteries. There is a very small posterior lateral system.  After reviewing the initial angiography, no culprit lesion was identified.   10/2015 PFTs Mild ventilatory defect  11/2015 AAA screen [place] No aneurysm  04/2014 echo Study Conclusions  - Left ventricle: The cavity size was normal. Wall thickness was normal. Systolic function was normal. The estimated ejection fraction was in the range of 60% to 65%. Wall motion was normal; there were no regional wall motion abnormalities. Left ventricular diastolic function parameters were normal. - Mitral valve: There was mild regurgitation.  05/2017 nuclear stress  No diagnostic ST segment changes to indicate ischemia.  Small, mild intensity, apical inferior defect that is reversible and consistent with a  small ischemic territory. Fixed inferior defect is more consistent with soft tissue attenuation.  This is a low risk study.  Nuclear stress EF: 76%.   06/2017 cath 1. Stable appearance of the coronary arteries since 2016, with 60% ostial D1 stenosis (FFR 0.90), patent mid LAD stent, and mild to moderate diffuse RCA disease. 2. Normal to hyperdynamic left ventricular contraction with low filling pressure (LVEDP 6 mmHg).   05/2018 cath  Left dominant coronary anatomy  Left main is widely patent  LAD including the mid vessel stent is widely patent.  The ostial first diagonal contains 60 to 70% stenosis, unchanged from 1 year ago.  The circumflex territory is relatively small, contains irregularities in the mid body, and is unchanged from prior.  Right coronary contains diffuse luminal irregularities before giving origin to the PDA.  No focal or significant obstruction is noted.  Low normal LVEF, 50%.  LVEDP is normal.  RECOMMENDATIONS:   No change in coronary anatomy.  Further management of atrial flutter.    Assessment and Plan  1. Generalized fatigue - nonspecific  generalized symptoms without clear cardiopulmonary symptoms - unclear etiology, we will obtain labs includnig cbc and TSH - EKG shows he is mainting SR. We will obtain a 1 week event monitor to see if having paroxysmal aflutter as the etiology.  - if ongoing symptoms may consider event monitor to see if he is paroxysmal - further workup for generalized fatigue by pcp  2. Atrial flutter - s/p recent DCCV - recent nonspecific episodes of fatigue. Obtain 1 week event monitor to see if associated with recurrent paroxysmal aflutter. EKG today shows NSR    Arnoldo Lenis, M.D.

## 2018-07-10 ENCOUNTER — Other Ambulatory Visit: Payer: Self-pay | Admitting: *Deleted

## 2018-07-10 ENCOUNTER — Other Ambulatory Visit: Payer: Self-pay

## 2018-07-10 NOTE — Patient Outreach (Signed)
HTA HIGH RISK PATIENT referred from IDT. Second successful outreach.  Eric Benitez reports he is doing well. No problematic sxs. He had an [place] cardiology consult yesterday. He will be receiving a heart monitor to wear for a week. He denies any palpitations recently. He denies SOB or edema and no CP. He denies GERD and constipation and has not seen any blood in his stools.  He is looking forward to returning to his gym workouts. We discussed being safe in the setting of COVID19.  Encouraged him to call if any problems occur.  THN CM Care Plan Problem One     Most Recent Value  Care Plan Problem One  Multiple trips to the hospiital in one month, different causes.  (Pended)   Role Documenting the Problem One  Care Management Coordinator  (Pended)   [place] for Problem One  Active  (Pended)   THN Long Term Goal   Pt agrees to participate in everyother week phone calls for general wellbeing and health education for the next 6 weeks.  (Pended)   THN Long Term Goal Start Date  06/26/18  (Pended)   THN CM Short Term Goal #1   Pt will not readmit over the next 30 days for any cause.  (Pended)   THN CM Short Term Goal #1 Start Date  06/26/18  (Pended)   THN CM Short Term Goal #2   Pt will report self administration of medication on ordered schedule over next 30 days.  (Pended)     THN CM Care Plan Problem Two     Most Recent Value  Care Plan Problem Two  New dx: Atrial Flutter  (Pended)   Role Documenting the Problem Two  Care Management Coordinator  (Pended)   Care Plan for Problem Two  Active  (Pended)   THN Long Term Goal  Pt will be able to discuss medications related to new dx: what they are and what they do over the next   (Pended)   THN Long Term Goal Start Date  06/26/18  (Pended)   THN CM Short Term Goal #1   Pt will report following heart healthy diet and increased activity over the next 30 days.  (Pended)   THN CM Short Term Goal #1 Start Date  06/26/18  (Pended)     [facility] CM Care Plan  Problem Three     Most Recent Value  Care Plan Problem Three  COVID19 Education   (Pended)   Role Documenting the Problem Three  Care Management Coordinator  (Pended)   Care Plan for Problem Three  Active  (Pended)   THN CM Short Term Goal #1   Will discuss and report actions to prevent this infection.  (Pended)   THN CM Short Term Goal #1 Start Date  06/26/18  (Pended)      Will contact pt is 2 weeks.  Eulah Pont. Myrtie Neither, MSN, [facility] Gerontological Nurse Practitioner [facility] Care Management [phone]

## 2018-07-12 DIAGNOSIS — Z79899 Other long term (current) drug therapy: Secondary | ICD-10-CM | POA: Diagnosis not present

## 2018-07-12 DIAGNOSIS — R42 Dizziness and giddiness: Secondary | ICD-10-CM | POA: Diagnosis not present

## 2018-07-12 DIAGNOSIS — I4892 Unspecified atrial flutter: Secondary | ICD-10-CM | POA: Diagnosis not present

## 2018-07-15 ENCOUNTER — Encounter: Payer: Self-pay | Admitting: Gastroenterology

## 2018-07-15 ENCOUNTER — Encounter: Payer: Self-pay | Admitting: *Deleted

## 2018-07-15 ENCOUNTER — Telehealth: Payer: Self-pay | Admitting: *Deleted

## 2018-07-15 NOTE — Telephone Encounter (Signed)
Patient informed. TSH requested from [facility]. Copy sent to PCP

## 2018-07-15 NOTE — Telephone Encounter (Signed)
-----   Message from Arnoldo Lenis, MD sent at 07/15/2018  3:31 PM EDT ----- Labs look fine, can we get the TSH that was pending on this report from the lab and add to his chart  Zandra Abts MD

## 2018-07-16 DIAGNOSIS — R42 Dizziness and giddiness: Secondary | ICD-10-CM | POA: Diagnosis not present

## 2018-07-24 ENCOUNTER — Other Ambulatory Visit: Payer: Self-pay

## 2018-07-24 ENCOUNTER — Other Ambulatory Visit: Payer: Self-pay | Admitting: *Deleted

## 2018-07-24 NOTE — Patient Outreach (Signed)
HTA Case Closure and transition to [facility] Management  Mr. Wenger is doing "GREAT!" he says. He has gone back to work. 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 He denies any cardiac issues. He has worn his heart monitor for a week and mailed it back to the provider. He has not heard back on the result. No CP, SOB, wt is stable, taking meds, walking. Next MD visit is on 07/27/18.   Advised this would be my last call and introduced [facility] Management for follow up.  THN CM Care Plan Problem One     Most Recent Value  Care Plan Problem One  Multiple trips to the hospiital in one month, different causes.  Role Documenting the Problem One  Care Management [place] for Problem One  Active  THN Long Term Goal   Pt agrees to participate in everyother week phone calls for general wellbeing and health education for the next 6 weeks.  THN Long Term Goal Start Date  06/26/18  THN Long Term Goal Met Date  07/24/18  Interventions for Problem One Long Term Goal  Reinforced chronic disease management regimen: health healthy diet, take meds, activity, COVID19 safety.  THN CM Short Term Goal #1   Pt will not readmit over the next 30 days for any cause.  THN CM Short Term Goal #1 Start Date  06/26/18  [facility] CM Short Term Goal #1 Met Date  07/24/18  Interventions for Short Term Goal #1  transitioning pt to [facility] care management.  THN CM Short Term Goal #2   Pt will report self administration of medication on ordered schedule over next 30 days.  THN CM Short Term Goal #2 Met Date  07/24/18  Interventions for Short Term Goal #2  Reviewed meds.    THN CM Care Plan Problem Two     Most Recent Value  Care Plan Problem Two  New dx: Atrial Flutter  Role Documenting the Problem Two  Care Management Coordinator  Care Plan for Problem Two  Active  THN Long Term Goal  Pt will be able to discuss medications related to new dx: what they are and what they do over the next   [facility] Long Term  Goal Start Date  06/26/18  [facility] Long Term Goal Met Date  07/10/18  THN CM Short Term Goal #1   Pt will report following heart healthy diet and increased activity over the next 30 days.  THN CM Short Term Goal #1 Start Date  06/26/18  THN CM Short Term Goal #1 Met Date   07/10/18    [facility] CM Care Plan Problem Three     Most Recent Value  Care Plan Problem Three  COVID19 Education   Role Documenting the Problem Three  Care Management Coordinator  Care Plan for Problem Three  Active  THN CM Short Term Goal #1   Will discuss and report actions to prevent this infection.  THN CM Short Term Goal #1 Start Date  06/26/18  [facility] CM Short Term Goal #1 Met Date  07/24/18  Interventions for Short Term Goal #1  Reminded to wash hands, continue to wear a mask in public and maintain safe distance of 6 ft.     Eulah Pont. Myrtie Neither, MSN, [facility] Gerontological Nurse Practitioner [facility] Care Management [phone]

## 2018-07-25 ENCOUNTER — Encounter: Payer: Self-pay | Admitting: *Deleted

## 2018-08-02 DIAGNOSIS — E7800 Pure hypercholesterolemia, unspecified: Secondary | ICD-10-CM | POA: Diagnosis not present

## 2018-08-02 DIAGNOSIS — I10 Essential (primary) hypertension: Secondary | ICD-10-CM | POA: Diagnosis not present

## 2018-08-02 DIAGNOSIS — R5383 Other fatigue: Secondary | ICD-10-CM | POA: Diagnosis not present

## 2018-08-02 DIAGNOSIS — E782 Mixed hyperlipidemia: Secondary | ICD-10-CM | POA: Diagnosis not present

## 2018-08-02 DIAGNOSIS — K210 Gastro-esophageal reflux disease with esophagitis: Secondary | ICD-10-CM | POA: Diagnosis not present

## 2018-08-06 ENCOUNTER — Telehealth: Payer: Self-pay | Admitting: *Deleted

## 2018-08-06 DIAGNOSIS — Z1212 Encounter for screening for malignant neoplasm of rectum: Secondary | ICD-10-CM | POA: Diagnosis not present

## 2018-08-06 DIAGNOSIS — I25111 Atherosclerotic heart disease of native coronary artery with angina pectoris with documented spasm: Secondary | ICD-10-CM | POA: Diagnosis not present

## 2018-08-06 DIAGNOSIS — D649 Anemia, unspecified: Secondary | ICD-10-CM | POA: Diagnosis not present

## 2018-08-06 DIAGNOSIS — Z6824 Body mass index (BMI) 24.0-24.9, adult: Secondary | ICD-10-CM | POA: Diagnosis not present

## 2018-08-06 DIAGNOSIS — Z0001 Encounter for general adult medical examination with abnormal findings: Secondary | ICD-10-CM | POA: Diagnosis not present

## 2018-08-06 DIAGNOSIS — N401 Enlarged prostate with lower urinary tract symptoms: Secondary | ICD-10-CM | POA: Diagnosis not present

## 2018-08-06 DIAGNOSIS — E782 Mixed hyperlipidemia: Secondary | ICD-10-CM | POA: Diagnosis not present

## 2018-08-06 DIAGNOSIS — I10 Essential (primary) hypertension: Secondary | ICD-10-CM | POA: Diagnosis not present

## 2018-08-06 NOTE — Telephone Encounter (Signed)
-----   Message from Arnoldo Lenis, MD sent at 08/06/2018  3:18 PM EDT ----- Heart monitor looks good, no significant abnormal rhythms   Zandra Abts MD

## 2018-08-06 NOTE — Telephone Encounter (Signed)
Pt aware - routed to pcp  

## 2018-08-07 ENCOUNTER — Ambulatory Visit: Payer: PPO | Admitting: *Deleted

## 2018-08-17 DIAGNOSIS — D485 Neoplasm of uncertain behavior of skin: Secondary | ICD-10-CM | POA: Diagnosis not present

## 2018-08-17 DIAGNOSIS — D044 Carcinoma in situ of skin of scalp and neck: Secondary | ICD-10-CM | POA: Diagnosis not present

## 2018-09-03 ENCOUNTER — Encounter: Payer: Self-pay | Admitting: Gastroenterology

## 2018-09-06 DIAGNOSIS — E785 Hyperlipidemia, unspecified: Secondary | ICD-10-CM | POA: Diagnosis not present

## 2018-09-06 DIAGNOSIS — I10 Essential (primary) hypertension: Secondary | ICD-10-CM | POA: Diagnosis not present

## 2018-09-14 DIAGNOSIS — C44319 Basal cell carcinoma of skin of other parts of face: Secondary | ICD-10-CM | POA: Diagnosis not present

## 2018-09-14 DIAGNOSIS — Z6824 Body mass index (BMI) 24.0-24.9, adult: Secondary | ICD-10-CM | POA: Diagnosis not present

## 2018-09-14 DIAGNOSIS — J329 Chronic sinusitis, unspecified: Secondary | ICD-10-CM | POA: Diagnosis not present

## 2018-09-16 DIAGNOSIS — D0439 Carcinoma in situ of skin of other parts of face: Secondary | ICD-10-CM | POA: Diagnosis not present

## 2018-10-07 DIAGNOSIS — E782 Mixed hyperlipidemia: Secondary | ICD-10-CM | POA: Diagnosis not present

## 2018-10-07 DIAGNOSIS — I10 Essential (primary) hypertension: Secondary | ICD-10-CM | POA: Diagnosis not present

## 2018-12-06 DIAGNOSIS — I10 Essential (primary) hypertension: Secondary | ICD-10-CM | POA: Diagnosis not present

## 2018-12-06 DIAGNOSIS — K219 Gastro-esophageal reflux disease without esophagitis: Secondary | ICD-10-CM | POA: Diagnosis not present

## 2019-01-06 DIAGNOSIS — I10 Essential (primary) hypertension: Secondary | ICD-10-CM | POA: Diagnosis not present

## 2019-01-06 DIAGNOSIS — E7800 Pure hypercholesterolemia, unspecified: Secondary | ICD-10-CM | POA: Diagnosis not present

## 2019-01-29 DIAGNOSIS — F5221 Male erectile disorder: Secondary | ICD-10-CM | POA: Diagnosis not present

## 2019-01-29 DIAGNOSIS — E782 Mixed hyperlipidemia: Secondary | ICD-10-CM | POA: Diagnosis not present

## 2019-01-29 DIAGNOSIS — R5383 Other fatigue: Secondary | ICD-10-CM | POA: Diagnosis not present

## 2019-01-29 DIAGNOSIS — E7800 Pure hypercholesterolemia, unspecified: Secondary | ICD-10-CM | POA: Diagnosis not present

## 2019-01-29 DIAGNOSIS — I10 Essential (primary) hypertension: Secondary | ICD-10-CM | POA: Diagnosis not present

## 2019-01-29 DIAGNOSIS — D649 Anemia, unspecified: Secondary | ICD-10-CM | POA: Diagnosis not present

## 2019-01-29 DIAGNOSIS — K219 Gastro-esophageal reflux disease without esophagitis: Secondary | ICD-10-CM | POA: Diagnosis not present

## 2019-01-29 DIAGNOSIS — D529 Folate deficiency anemia, unspecified: Secondary | ICD-10-CM | POA: Diagnosis not present

## 2019-01-29 DIAGNOSIS — R42 Dizziness and giddiness: Secondary | ICD-10-CM | POA: Diagnosis not present

## 2019-01-29 DIAGNOSIS — I4891 Unspecified atrial fibrillation: Secondary | ICD-10-CM | POA: Diagnosis not present

## 2019-02-03 DIAGNOSIS — E782 Mixed hyperlipidemia: Secondary | ICD-10-CM | POA: Diagnosis not present

## 2019-02-03 DIAGNOSIS — K219 Gastro-esophageal reflux disease without esophagitis: Secondary | ICD-10-CM | POA: Diagnosis not present

## 2019-02-03 DIAGNOSIS — I10 Essential (primary) hypertension: Secondary | ICD-10-CM | POA: Diagnosis not present

## 2019-02-03 DIAGNOSIS — N401 Enlarged prostate with lower urinary tract symptoms: Secondary | ICD-10-CM | POA: Diagnosis not present

## 2019-02-03 DIAGNOSIS — I25111 Atherosclerotic heart disease of native coronary artery with angina pectoris with documented spasm: Secondary | ICD-10-CM | POA: Diagnosis not present

## 2019-02-03 DIAGNOSIS — D649 Anemia, unspecified: Secondary | ICD-10-CM | POA: Diagnosis not present

## 2019-02-03 DIAGNOSIS — Z6824 Body mass index (BMI) 24.0-24.9, adult: Secondary | ICD-10-CM | POA: Diagnosis not present

## 2019-02-06 DIAGNOSIS — I10 Essential (primary) hypertension: Secondary | ICD-10-CM | POA: Diagnosis not present

## 2019-02-06 DIAGNOSIS — K219 Gastro-esophageal reflux disease without esophagitis: Secondary | ICD-10-CM | POA: Diagnosis not present

## 2019-03-07 DIAGNOSIS — I10 Essential (primary) hypertension: Secondary | ICD-10-CM | POA: Diagnosis not present

## 2019-03-07 DIAGNOSIS — K219 Gastro-esophageal reflux disease without esophagitis: Secondary | ICD-10-CM | POA: Diagnosis not present

## 2019-03-12 DIAGNOSIS — Z6824 Body mass index (BMI) 24.0-24.9, adult: Secondary | ICD-10-CM | POA: Diagnosis not present

## 2019-03-12 DIAGNOSIS — H8110 Benign paroxysmal vertigo, unspecified ear: Secondary | ICD-10-CM | POA: Diagnosis not present

## 2019-04-04 ENCOUNTER — Telehealth: Payer: Self-pay

## 2019-04-04 DIAGNOSIS — I10 Essential (primary) hypertension: Secondary | ICD-10-CM | POA: Diagnosis not present

## 2019-04-04 DIAGNOSIS — E7849 Other hyperlipidemia: Secondary | ICD-10-CM | POA: Diagnosis not present

## 2019-04-04 NOTE — Telephone Encounter (Signed)
04-04-19/8:44am Mrs.Bonello concerned about elevated BP and elevated HR. Scheduled Pt first available with Brittany,PA-C.  Please call Mrs. Bortner [phone]  Thanks renee

## 2019-04-04 NOTE — Telephone Encounter (Signed)
Virtual Visit Pre-Appointment Phone Call  "(Name), I am calling you today to discuss your upcoming appointment. We are currently trying to limit exposure to the virus that causes COVID-19 by seeing patients at home rather than in the office."  1. "What is the BEST phone number to call the day of the visit?" - include this in appointment notes  2. "Do you have or have access to (through a family member/friend) a smartphone with video capability that we can use for your visit?" a. If yes - list this number in appt notes as "cell" (if different from BEST phone #) and list the appointment type as a VIDEO visit in appointment notes b. If no - list the appointment type as a PHONE visit in appointment notes  Confirm consent - "In the setting of the current Covid19 crisis, you are scheduled for a (phone or video) visit with your provider on (date) at (time).  Just as we do with many in-office visits, in order for you to participate in this visit, we must obtain consent.  If you'd like, I can send this to your mychart (if signed up) or email for you to review.  Otherwise, I can obtain your verbal consent now.  All virtual visits are billed to your insurance company just like a normal visit would be.  By agreeing to a virtual visit, we'd like you to understand that the technology does not allow for your provider to perform an examination, and thus may limit your provider's ability to fully assess your condition. If your provider identifies any concerns that need to be evaluated in person, we will make arrangements to do so.  Finally, though the technology is pretty good, we cannot assure that it will always work on either your or our end, and in the setting of a video visit, we may have to convert it to a phone-only visit.  In either situation, we cannot ensure that we have a secure connection.  Are you willing to proceed?" STAFF: Did the patient verbally acknowledge consent to telehealth visit? Document  YES/NO here:  3. Advise patient to be prepared - "Two hours prior to your appointment, go ahead and check your blood pressure, pulse, oxygen saturation, and your weight (if you have the equipment to check those) and write them all down. When your visit starts, your provider will ask you for this information. If you have an Apple Watch or Kardia device, please plan to have heart rate information ready on the day of your appointment. Please have a pen and paper handy nearby the day of the visit as well."  4. Give patient instructions for MyChart download to smartphone OR Doximity/Doxy.me as below if video visit (depending on what platform provider is using)  5. Inform patient they will receive a phone call 15 minutes prior to their appointment time (may be from unknown caller ID) so they should be prepared to answer    TELEPHONE CALL NOTE  Eric Benitez has been deemed a candidate for a follow-up tele-health visit to limit community exposure during the Covid-19 pandemic. I spoke with the patient via phone to ensure availability of phone/video source, confirm preferred email & phone number, and discuss instructions and expectations.  I reminded Eric Benitez to be prepared with any vital sign and/or heart rhythm information that could potentially be obtained via home monitoring, at the time of his visit. I reminded Eric Benitez to expect a phone call prior to his visit.  Dorothey Baseman 04/04/2019 8:49 AM   INSTRUCTIONS FOR DOWNLOADING THE MYCHART APP TO SMARTPHONE  - The patient must first make sure to have activated MyChart and know their login information - If Apple, go to App Store and type in MyChart in the search bar and download the app. If Android, ask patient to go to [employer] and type in [place] in the search bar and download the app. The app is free but as with any other app downloads, their phone may require them to verify saved payment information or Apple/Android  password.  - The patient will need to then log into the app with their MyChart username and password, and select [facility] as their healthcare provider to link the account. When it is time for your visit, go to the MyChart app, find appointments, and click Begin Video Visit. Be sure to Select Allow for your device to access the Microphone and Camera for your visit. You will then be connected, and your provider will be with you shortly.  **If they have any issues connecting, or need assistance please contact MyChart service desk [phone])**  **If using a computer, in order to ensure the best quality for their visit they will need to use either of the following Internet Browsers: [employer], or Google Chrome**  IF USING DOXIMITY or DOXY.ME - The patient will receive a link just prior to their visit by text.     FULL LENGTH CONSENT FOR TELE-HEALTH VISIT   I hereby voluntarily request, consent and authorize [facility] and its employed or contracted physicians, physician assistants, nurse practitioners or other licensed health care professionals (the Practitioner), to provide me with telemedicine health care services (the "Services") as deemed necessary by the treating Practitioner. I acknowledge and consent to receive the Services by the Practitioner via telemedicine. I understand that the telemedicine visit will involve communicating with the Practitioner through live audiovisual communication technology and the disclosure of certain medical information by electronic transmission. I acknowledge that I have been given the opportunity to request an in-person assessment or other available alternative prior to the telemedicine visit and am voluntarily participating in the telemedicine visit.  I understand that I have the right to withhold or withdraw my consent to the use of telemedicine in the course of my care at any time, without affecting my right to future care or treatment,  and that the Practitioner or I may terminate the telemedicine visit at any time. I understand that I have the right to inspect all information obtained and/or recorded in the course of the telemedicine visit and may receive copies of available information for a reasonable fee.  I understand that some of the potential risks of receiving the Services via telemedicine include:  Marland Kitchen Delay or interruption in medical evaluation due to technological equipment failure or disruption; . Information transmitted may not be sufficient (e.g. poor resolution of images) to allow for appropriate medical decision making by the Practitioner; and/or  . In rare instances, security protocols could fail, causing a breach of personal health information.  Furthermore, I acknowledge that it is my responsibility to provide information about my medical history, conditions and care that is complete and accurate to the best of my ability. I acknowledge that Practitioner's advice, recommendations, and/or decision may be based on factors not within their control, such as incomplete or inaccurate data provided by me or distortions of diagnostic images or specimens that may result from electronic transmissions. I understand that the practice  of medicine is not an Chief Strategy Officer and that Practitioner makes no warranties or guarantees regarding treatment outcomes. I acknowledge that I will receive a copy of this consent concurrently upon execution via email to the email address I last provided but may also request a printed copy by calling the office of [facility].    I understand that my insurance will be billed for this visit.   I have read or had this consent read to me. . I understand the contents of this consent, which adequately explains the benefits and risks of the Services being provided via telemedicine.  . I have been provided ample opportunity to ask questions regarding this consent and the Services and have had my questions  answered to my satisfaction. . I give my informed consent for the services to be provided through the use of telemedicine in my medical care  By participating in this telemedicine visit I agree to the above.

## 2019-04-04 NOTE — Telephone Encounter (Signed)
Spoke with pt's wife. She stated on Sunday morning her husband's HR was 132. It has not been thjat way since. His bp/hr this past week are: (101/63  78, 156/83  58, 123/63  63,  150/87   64)  I informed her to keep a check on bp/hr until his virtual visit so that [place] will have more numbers to compare. Pt's wife voiced understanding.

## 2019-04-09 NOTE — Progress Notes (Signed)
Virtual Visit via Telephone Note   This visit type was conducted due to national recommendations for restrictions regarding the COVID-19 Pandemic (e.g. social distancing) in an effort to limit this patient's exposure and mitigate transmission in our community.  Due to his co-morbid illnesses, this patient is at least at moderate risk for complications without adequate follow up.  This format is felt to be most appropriate for this patient at this time.  The patient did not have access to video technology/had technical difficulties with video requiring transitioning to audio format only (telephone).  All issues noted in this document were discussed and addressed.  No physical exam could be performed with this format.  Please refer to the patient's chart for his  consent to telehealth for [facility].   Date:  04/10/2019   ID:  Eric Benitez, DOB July 18, 1940, MRN [id number]  Patient Location: Home Provider Location: Office  PCP:  Caryl Bis, MD  Cardiologist:  Carlyle Dolly, MD Electrophysiologist:  None   Evaluation Performed:  Follow-Up Visit  Chief Complaint: Elevated HR  History of Present Illness:    Eric Benitez is a 79 y.o. male with past medical history of CAD (s/p prior stenting of LAD with patent stent by cath in 2016, 06/2017 and 05/2018 --> residual 60%-70% D1 stenosis and moderate disease along the RCA with continued medical management recommended), persistent atrial flutter (s/p DCCV in 06/2018), HTN, HLD, GERD, and gastritis (diagnosed by EGD on 05/31/2018) who presents for a follow-up telehealth visit in regards to elevated HR.   He was last examined by Dr. Harl Bowie in 07/2018 and denied any recent chest pain or dyspnea at that time. He did report worsening fatigue and a 1 week monitor was recommended to make sure he was not having recurrent atrial flutter. This showed normal sinus rhythm with no recurrent arrhythmias. Heart rate was variable from 52 bpm to 120  bpm with an average of 69 bpm.   He called the office on 04/04/2019 reporting elevated HR of 132 the prior weekend but since had improved into the 50's to 60's, therefore follow-up was arranged.   In talking with the patient today, he reports having fatigue and feeling "off" since last December. He was treated by his PCP for sinusitis with a course of antibiotics. He reports his sinus congestion has since improved but over the past few weeks he has been experiencing worsening dizziness and balance issues. He does stay well-hydrated and consumes 3-4 bottles of water a day along with 2 cups of coffee in the morning. Denies any alcohol use.  Last Sunday, he reported having checked his heart rate and it was elevated in the 130's but had improved into the 60's that afternoon. He was very dizzy and fatigued at that time but did not experience any specific palpitations. He denies any recent palpitations, dyspnea on exertion, or chest pain. No recent orthopnea, PND or lower extremity edema. Weight has been stable on his home scales.  He is still having persistent dizziness which lasts the entire day. Sometimes worse with positional changes. He did check orthostatics on himself yesterday and BP declined from 144/81 with sitting to 126/64 with standing. This was checked on 04/08/2019 as well and went from 148/79 with sitting to 138/75 with standing. His heart rate has overall been stable this week in the 60's to 70's.  The patient does not have symptoms concerning for COVID-19 infection (fever, chills, cough, or new shortness of breath).  Past Medical History:  Diagnosis Date  . Arthritis    "neck, hands, fingers" (04/09/2014)  . Bleeds easily ([place])   . CAD (coronary artery disease)    a. s/p prior stenting of LAD b.  patent stent by cath in 2016 and 06/2017 --> residual 60% D1 stenosis and moderate disease along the RCA  . Hyperlipemia   . Hypertension   . Kidney stones    "I've had several; had to go in  and get them once" (04/09/2014)  . Pneumonia 1940's   Past Surgical History:  Procedure Laterality Date  . ANKLE FRACTURE SURGERY Right 2009  . CARDIAC CATHETERIZATION  1990's X 2;  04/09/2014  . CARDIOVERSION N/A 06/07/2018   Procedure: CARDIOVERSION;  Surgeon: Jolaine Artist, MD;  Location: [facility] ENDOSCOPY;  Service: Cardiovascular;  Laterality: N/A;  . COLONOSCOPY N/A 01/22/2017   Dr. fields: 6 tubular adenomas removed, external hemorrhoids.  Recommended next exam in 3 years.  . CORONARY ANGIOPLASTY WITH STENT PLACEMENT  2006   "2"  . CYSTOSCOPY W/ STONE MANIPULATION  ~ 2008  . ESOPHAGOGASTRODUODENOSCOPY N/A 01/22/2017   Dr. Oneida Alar: Mild to moderate gastritis.  No H. pylori  . ESOPHAGOGASTRODUODENOSCOPY N/A 05/31/2018   Procedure: ESOPHAGOGASTRODUODENOSCOPY (EGD);  Surgeon: Danie Binder, MD;  Location: [facility] ENDO SUITE;  Service: Endoscopy;  Laterality: N/A;  8:30am  . FRACTURE SURGERY    . INTRAVASCULAR PRESSURE WIRE/FFR STUDY N/A 06/08/2017   Procedure: INTRAVASCULAR PRESSURE WIRE/FFR STUDY;  Surgeon: Nelva Bush, MD;  Location: [facility] CV LAB;  Service: Cardiovascular;  Laterality: N/A;  . LEFT HEART CATH AND CORONARY ANGIOGRAPHY N/A 06/08/2017   Procedure: LEFT HEART CATH AND CORONARY ANGIOGRAPHY;  Surgeon: Nelva Bush, MD;  Location: [place] CV LAB;  Service: Cardiovascular;  Laterality: N/A;  . LEFT HEART CATH AND CORONARY ANGIOGRAPHY N/A 06/05/2018   Procedure: LEFT HEART CATH AND CORONARY ANGIOGRAPHY;  Surgeon: Belva Crome, MD;  Location: [place] CV LAB;  Service: Cardiovascular;  Laterality: N/A;  . LEFT HEART CATHETERIZATION WITH CORONARY ANGIOGRAM N/A 04/09/2014   Procedure: LEFT HEART CATHETERIZATION WITH CORONARY ANGIOGRAM;  Surgeon: Leonie Man, MD;  [facility] C[facility] LAB;  Service: Cardiovascular;  Laterality: N/A;  . NASAL SEPTUM SURGERY  1999  . POLYPECTOMY  01/22/2017   Procedure: POLYPECTOMY;  Surgeon: Danie Binder, MD;  Location: [facility] ENDO SUITE;   Service: Endoscopy;;  right colon x4  . TEE WITHOUT CARDIOVERSION N/A 06/07/2018   Procedure: TRANSESOPHAGEAL ECHOCARDIOGRAM (TEE);  Surgeon: Jolaine Artist, MD;  Location: [facility] ENDOSCOPY;  Service: Cardiovascular;  Laterality: N/A;     Current Meds  Medication Sig  . albuterol (VENTOLIN HFA) 108 (90 Base) MCG/ACT inhaler Inhale 2 puffs into the lungs every 6 (six) hours as needed for wheezing or shortness of breath.  Marland Kitchen apixaban (ELIQUIS) 5 MG TABS tablet Take 1 tablet (5 mg total) by mouth every 12 (twelve) hours.  Marland Kitchen atorvastatin (LIPITOR) 80 MG tablet Take 80 mg by mouth at bedtime.   . dicyclomine (BENTYL) 10 MG capsule Take 1 capsule (10 mg total) by mouth 3 (three) times daily as needed (abdominal pain).  Marland Kitchen diphenhydramine-acetaminophen (TYLENOL PM) 25-500 MG TABS tablet Take 2 tablets by mouth at bedtime.   Marland Kitchen doxazosin (CARDURA) 8 MG tablet Take 8 mg by mouth at bedtime.   . Fluticasone Propionate (ALLERGY RELIEF NA) Place 2 sprays into the nose daily as needed (allergies).  . Hypromellose (ARTIFICIAL TEARS OP) Place 1-2 drops into both eyes daily as needed (  for dry eyes).  Marland Kitchen lactulose (CHRONULAC) 10 GM/15ML solution Take 15 mLs (10 g total) by mouth 2 (two) times daily as needed for moderate constipation.  Marland Kitchen lisinopril (PRINIVIL,ZESTRIL) 2.5 MG tablet TAKE 1 TABLET BY MOUTH EVERY DAY (Patient taking differently: Take 2.5 mg by mouth at bedtime. )  . loratadine (CLARITIN) 10 MG tablet Take 10 mg by mouth daily.  . metoprolol succinate (TOPROL-XL) 25 MG 24 hr tablet Take 25 mg by mouth daily.   . nitroGLYCERIN (NITROSTAT) 0.4 MG SL tablet PLACE 1 TABLET (0.4 MG TOTAL) UNDER THE TONGUE EVERY 5 (FIVE) MINUTES AS NEEDED FOR CHEST PAIN.  Marland Kitchen pantoprazole (PROTONIX) 40 MG tablet TAKE 1 TABLET BY MOUTH DAILY (Patient taking differently: Take 40 mg by mouth daily)  . ranolazine (RANEXA) 500 MG 12 hr tablet Take 500 mg by mouth 2 (two) times daily.   Marland Kitchen senna (SENOKOT) 8.6 MG tablet Take 3 tablets  by mouth at bedtime.   . vitamin B-12 (CYANOCOBALAMIN) 1000 MCG tablet Take 1,000 mcg by mouth daily.     Allergies:   Vancomycin   Social History   Tobacco Use  . Smoking status: Former Smoker    Packs/day: 1.50    Years: 22.00    Pack years: 33.00    Types: Cigarettes    Start date: 11/28/1942    Quit date: 02/07/1964    Years since quitting: 55.2  . Smokeless tobacco: Never Used  Substance Use Topics  . Alcohol use: No    Alcohol/week: 0.0 standard drinks  . Drug use: No     Family Hx: The patient's family history includes Heart disease in his mother. There is no history of Colon cancer, Gastric cancer, or Esophageal cancer.  ROS:   Please see the history of present illness.     All other systems reviewed and are negative.   Prior CV studies:   The following studies were reviewed today:   Cardiac Catheterization: 05/2018  Left dominant coronary anatomy  Left main is widely patent  LAD including the mid vessel stent is widely patent.  The ostial first diagonal contains 60 to 70% stenosis, unchanged from 1 year ago.  The circumflex territory is relatively small, contains irregularities in the mid body, and is unchanged from prior.  Right coronary contains diffuse luminal irregularities before giving origin to the PDA.  No focal or significant obstruction is noted.  Low normal LVEF, 50%.  LVEDP is normal.  RECOMMENDATIONS:   No change in coronary anatomy.  Further management of atrial flutter.  TEE: 06/2018 IMPRESSIONS    1. The left ventricle has normal systolic function, with an ejection  fraction of 55-60%. There is moderately increased left ventricular wall  thickness.  2. The right ventricle has normal systolc function. There is no increase  in right ventricular wall thickness.  3. Left atrial size was mildly dilated.  4. Mitral valve regurgitation is mild to moderate by color flow Doppler.  The MR jet is posteriorly-directed.  5.  Posterior MV leaflet restricted.  6. The aortic valve is tricuspid Mild thickening of the aortic valve.  Aortic valve regurgitation is trivial by color flow Doppler.  7. The interatrial septum appears to be lipomatous.    Event Monitor: 07/2018  7 day event monitor  Min HR 52, Max HR 120, Avg HR 69  No symptoms reported  Telemetry tracings show normal sinus rhythm   Labs/Other Tests and Data Reviewed:    EKG:  An ECG dated 06/07/2018 was  personally reviewed today and demonstrated:  NSR, HR 81 with no acute ST or T-wave abnormalities.   Recent Labs: 06/03/2018: TSH 2.495 06/04/2018: Magnesium 2.2 06/08/2018: ALT 30; BUN 19; Creatinine, Ser 1.09; Hemoglobin 12.9; Platelets 156; Potassium 4.6; Sodium 138   Recent Lipid Panel Lab Results  Component Value Date/Time   CHOL 123 06/05/2018 05:39 AM   TRIG 37 06/05/2018 05:39 AM   HDL 50 06/05/2018 05:39 AM   CHOLHDL 2.5 06/05/2018 05:39 AM   LDLCALC 66 06/05/2018 05:39 AM    Wt Readings from Last 3 Encounters:  04/10/19 165 lb (74.8 kg)  07/09/18 167 lb (75.8 kg)  06/08/18 154 lb 12.2 oz (70.2 kg)     Objective:    Vital Signs:  BP (!) 152/91   Pulse 64   Ht 5\' 9"  (1.753 m)   Wt 165 lb (74.8 kg)   BMI 24.37 kg/m    General: Pleasant male sounding in NAD Psych: Normal affect. Neuro: Alert and oriented X 3.  Lungs:  Resp regular and unlabored while talking on the phone.    ASSESSMENT & PLAN:    1. Dizziness - The etiology of his symptoms is unclear as he did have evidence of orthostasis when checked yesterday but resting BP has overall been stable and symptoms are not always associated with positional changes. He is not on a diuretic and we did review trying to increase his fluid intake for the next several days to see if this helps with symptoms. - He is unable to recall the symptoms he experienced when in atrial flutter and given his dizziness along with episodes of tachycardia, I am concerned that he may be having  repeat episodes of the arrhythmia. Will arrange for him to have a nurse visit next Monday or Tuesday for an EKG. If he is found to be in normal sinus rhythm at that time, would plan for a 2-week cardiac event monitor to rule out any significant arrhythmias. - He does have follow-up with his PCP tomorrow and I encouraged him to review the possibility of trying Meclizine as vertigo could be contributing as well.   2. CAD - he is s/p prior stenting of LAD with patent stent by cath in 2016, 06/2017 and 05/2018 with residual 60%-70% D1 stenosis and moderate disease along the RCA with continued medical management recommended as outlined above.  - He denies any recent chest pain or dyspnea on exertion. - Continue Atorvastatin 80 mg daily, Toprol-XL 25 mg daily and Ranexa 500mg  BID. He is not on ASA given the need for anticoagulation.   3. Persistent Atrial Flutter - s/p DCCV in 06/2018. Given his episodes of dizziness and weakness as outlined above, will plan for an EKG next week and 2-week cardiac event monitor pending results to rule out any recurrence. - Continue Toprol-XL 25 mg daily for rate control along with Eliquis 5 mg twice daily for anticoagulation.  4. HTN - BP was elevated at 152/91 when checked today, but will hold off on adjusting medications at this time given his variable readings and possible orthostasis as outlined above.  He remains on Lisinopril 2.5 mg daily and Toprol-XL 25 mg daily.  5. HLD - Followed by PCP. He remains on Atorvastatin 80 mg daily with goal LDL less than 70 in the setting of known CAD.   COVID-19 Education: The signs and symptoms of COVID-19 were discussed with the patient and how to seek care for testing (follow up with PCP or arrange E-visit).  The importance of social distancing was discussed today.  Time:   Today, I have spent 19 minutes with the patient with telehealth technology discussing the above problems.     Medication Adjustments/Labs and Tests  Ordered: Current medicines are reviewed at length with the patient today.  Concerns regarding medicines are outlined above.   Tests Ordered: No orders of the defined types were placed in this encounter.   Medication Changes: No orders of the defined types were placed in this encounter.   Follow Up:  In Person 6-8 weeks.   Signed, Erma Heritage, PA-C  04/10/2019 2:13 PM    [facility]

## 2019-04-10 ENCOUNTER — Encounter: Payer: Self-pay | Admitting: Student

## 2019-04-10 ENCOUNTER — Telehealth (INDEPENDENT_AMBULATORY_CARE_PROVIDER_SITE_OTHER): Payer: PPO | Admitting: Student

## 2019-04-10 VITALS — BP 152/91 | HR 64 | Ht 69.0 in | Wt 165.0 lb

## 2019-04-10 DIAGNOSIS — I10 Essential (primary) hypertension: Secondary | ICD-10-CM

## 2019-04-10 DIAGNOSIS — R42 Dizziness and giddiness: Secondary | ICD-10-CM | POA: Diagnosis not present

## 2019-04-10 DIAGNOSIS — E785 Hyperlipidemia, unspecified: Secondary | ICD-10-CM

## 2019-04-10 DIAGNOSIS — I483 Typical atrial flutter: Secondary | ICD-10-CM

## 2019-04-10 DIAGNOSIS — I2510 Atherosclerotic heart disease of native coronary artery without angina pectoris: Secondary | ICD-10-CM

## 2019-04-10 NOTE — Patient Instructions (Signed)
Medication Instructions:  Your physician recommends that you continue on your current medications as directed. Please refer to the Current Medication list given to you today.  *If you need a refill on your cardiac medications before your next appointment, please call your pharmacy*   Lab Work: NONE   If you have labs (blood work) drawn today and your tests are completely normal, you will receive your results only by: Marland Kitchen MyChart Message (if you have MyChart) OR . A paper copy in the mail If you have any lab test that is abnormal or we need to change your treatment, we will call you to review the results.   Testing/Procedures: EKG Next Week     Follow-Up: At [facility], you and your health needs are our priority.  As part of our continuing mission to provide you with exceptional heart care, we have created designated Provider Care Teams.  These Care Teams include your primary Cardiologist (physician) and Advanced Practice Providers (APPs -  Physician Assistants and Nurse Practitioners) who all work together to provide you with the care you need, when you need it.  We recommend signing up for the patient portal called "MyChart".  Sign up information is provided on this After Visit Summary.  MyChart is used to connect with patients for Virtual Visits (Telemedicine).  Patients are able to view lab/test results, encounter notes, upcoming appointments, etc.  Non-urgent messages can be sent to your provider as well.   To learn more about what you can do with MyChart, go to [email or web address].    Your next appointment:   6-8 week(s)  The format for your next appointment:   In Person  Provider:   You may see Carlyle Dolly, MD or one of the following Advanced Practice Providers on your designated Care Team:    Bernerd Pho, PA-C   Ermalinda Barrios, [place]     Other Instructions Thank you for choosing [facility]!

## 2019-04-11 DIAGNOSIS — Z6824 Body mass index (BMI) 24.0-24.9, adult: Secondary | ICD-10-CM | POA: Diagnosis not present

## 2019-04-11 DIAGNOSIS — R42 Dizziness and giddiness: Secondary | ICD-10-CM | POA: Diagnosis not present

## 2019-04-14 ENCOUNTER — Other Ambulatory Visit: Payer: Self-pay

## 2019-04-14 ENCOUNTER — Ambulatory Visit (INDEPENDENT_AMBULATORY_CARE_PROVIDER_SITE_OTHER): Payer: PPO | Admitting: *Deleted

## 2019-04-14 DIAGNOSIS — I483 Typical atrial flutter: Secondary | ICD-10-CM

## 2019-04-14 NOTE — Progress Notes (Signed)
    Please let the patient know his EKG showed sinus bradycardia with PAC's but no atrial fibrillation. He was suppose to have a visit with his PCP in the interim in regards to his dizziness. If symptoms have not improved, would recommend a 2-week cardiac event monitor as discussed at the time of his virtual visit. Associations for monitor would be dizziness and atrial flutter.   Signed, Erma Heritage, PA-C 04/14/2019, 3:06 PM Pager: [phone]

## 2019-04-14 NOTE — Progress Notes (Signed)
Patient in office this morning for ekg.  Scanned into epic & fwd to B. Strader.

## 2019-04-17 ENCOUNTER — Telehealth: Payer: Self-pay | Admitting: Cardiology

## 2019-04-17 DIAGNOSIS — R002 Palpitations: Secondary | ICD-10-CM

## 2019-04-17 NOTE — Telephone Encounter (Signed)
Pt wanted results of EKG/nurse appt on 3/8 - still c/o of dizziness went to Dr Olena Heckle on Friday who also suggested cardiac monitor - will order and enroll   Please let the patient know his EKG showed sinus bradycardia with PAC's but no atrial fibrillation. He was suppose to have a visit with his PCP in the interim in regards to his dizziness. If symptoms have not improved, would recommend a 2-week cardiac event monitor as discussed at the time of his virtual visit. Associations for monitor would be dizziness and atrial flutter.   Signed, Erma Heritage, PA-C 04/14/2019, 3:06 PM Pager: [phone]

## 2019-04-17 NOTE — Telephone Encounter (Signed)
Asking to speak with someone about EKG done this past Monday

## 2019-04-24 ENCOUNTER — Encounter (INDEPENDENT_AMBULATORY_CARE_PROVIDER_SITE_OTHER): Payer: PPO

## 2019-04-24 DIAGNOSIS — R002 Palpitations: Secondary | ICD-10-CM

## 2019-04-24 DIAGNOSIS — R42 Dizziness and giddiness: Secondary | ICD-10-CM | POA: Diagnosis not present

## 2019-05-07 DIAGNOSIS — E7849 Other hyperlipidemia: Secondary | ICD-10-CM | POA: Diagnosis not present

## 2019-05-07 DIAGNOSIS — I10 Essential (primary) hypertension: Secondary | ICD-10-CM | POA: Diagnosis not present

## 2019-05-13 ENCOUNTER — Encounter: Payer: Self-pay | Admitting: *Deleted

## 2019-05-13 ENCOUNTER — Other Ambulatory Visit: Payer: Self-pay

## 2019-05-13 ENCOUNTER — Encounter: Payer: Self-pay | Admitting: Cardiology

## 2019-05-13 ENCOUNTER — Telehealth: Payer: Self-pay | Admitting: Cardiology

## 2019-05-13 ENCOUNTER — Ambulatory Visit: Payer: PPO | Admitting: Cardiology

## 2019-05-13 VITALS — BP 144/80 | HR 73 | Ht 69.0 in | Wt 166.6 lb

## 2019-05-13 DIAGNOSIS — R42 Dizziness and giddiness: Secondary | ICD-10-CM | POA: Diagnosis not present

## 2019-05-13 DIAGNOSIS — I480 Paroxysmal atrial fibrillation: Secondary | ICD-10-CM | POA: Diagnosis not present

## 2019-05-13 DIAGNOSIS — I10 Essential (primary) hypertension: Secondary | ICD-10-CM

## 2019-05-13 DIAGNOSIS — R0600 Dyspnea, unspecified: Secondary | ICD-10-CM | POA: Diagnosis not present

## 2019-05-13 DIAGNOSIS — I2510 Atherosclerotic heart disease of native coronary artery without angina pectoris: Secondary | ICD-10-CM | POA: Diagnosis not present

## 2019-05-13 DIAGNOSIS — Z7901 Long term (current) use of anticoagulants: Secondary | ICD-10-CM | POA: Diagnosis not present

## 2019-05-13 DIAGNOSIS — R0609 Other forms of dyspnea: Secondary | ICD-10-CM

## 2019-05-13 DIAGNOSIS — Z9861 Coronary angioplasty status: Secondary | ICD-10-CM

## 2019-05-13 NOTE — Telephone Encounter (Signed)
Patient had a bad spell the other day that lasted about 30 seconds and after he felt horrible all day with neck pain. He is asking if he can come in to see provider today if needed  Also asking for monitor results

## 2019-05-13 NOTE — Assessment & Plan Note (Signed)
PCI to LAD in 2006- Patent at cath in 2016, 2019, and April 2020 with residual 60-70% Dx1, moderate RCA disease

## 2019-05-13 NOTE — Telephone Encounter (Signed)
Reports continued dizziness and intermittent chest tightness. Reports these symptoms have been going on since December 2020. Denies SOB. Denies active chest pain. Appointment scheduled with [facility] for today at 10:30 am.

## 2019-05-13 NOTE — Telephone Encounter (Signed)
  Precert needed for:  -carotid - dizziness  Location:  [facility]    Date: June 05, 2019

## 2019-05-13 NOTE — Assessment & Plan Note (Signed)
S/P DCCV May 2020- no recurrence on OP Monitor march 2021

## 2019-05-13 NOTE — Assessment & Plan Note (Signed)
Controlled.  

## 2019-05-13 NOTE — Assessment & Plan Note (Signed)
CHADS VASC=4, he is on Eliquis 5mg  BID

## 2019-05-13 NOTE — Progress Notes (Signed)
Cardiology Office Note:    Date:  05/13/2019   ID:  Eric Benitez, DOB 04/10/1940, MRN [id number]  PCP:  Caryl Bis, MD  Cardiologist:  Carlyle Dolly, MD  Electrophysiologist:  None   Referring MD: Caryl Bis, MD   CC: Dizziness  History of Present Illness:    Eric Benitez is a 79 y.o. male with a hx of coronary disease, status post remote LAD intervention.  He has had problems with chronic recurrent chest pain.  Catheterizations in 2016 2019 and April 2020 showed the LAD to be patent.  He has a residual 60 to 70% diagonal narrowing and moderate RCA disease which is treated medically.  In May 2020 he did have atrial fibrillation and underwent outpatient cardioversion.  Monitor done in June 2020 and again most recently in March 2021 shows that he is maintaining sinus rhythm.    The patient was seen recently with complaints of dizziness and chest discomfort.  He has not clearly been orthostatic.  A monitor was placed after an office visit with Bernerd Pho PA and reviewed, there has been no atrial fibrillation.  The patient was seen by me today in the office in follow-up.  He describes an episode of dizziness 3 days ago, this was especially noticeable when he was doing some work and tilted his head back to look up.  He says it felt like he was going to fall and had to grab something.  What he described to me did not sound like orthostasis. He has dizziness currently as well.  He also complains of chest discomfort, though this is not clearly angina.  The patient tells me "something is wrong".  He says his symptoms pre PCI were vague, he thinks he may have had dizziness then as well.   Past Medical History:  Diagnosis Date  . Arthritis    "neck, hands, fingers" (04/09/2014)  . Bleeds easily ([place])   . CAD (coronary artery disease)    a. s/p prior stenting of LAD b.  patent stent by cath in 2016 and 06/2017 --> residual 60% D1 stenosis and moderate disease along the RCA  .  Hyperlipemia   . Hypertension   . Kidney stones    "I've had several; had to go in and get them once" (04/09/2014)  . Pneumonia 1940's    Past Surgical History:  Procedure Laterality Date  . ANKLE FRACTURE SURGERY Right 2009  . CARDIAC CATHETERIZATION  1990's X 2;  04/09/2014  . CARDIOVERSION N/A 06/07/2018   Procedure: CARDIOVERSION;  Surgeon: Jolaine Artist, MD;  Location: [facility] ENDOSCOPY;  Service: Cardiovascular;  Laterality: N/A;  . COLONOSCOPY N/A 01/22/2017   Dr. fields: 6 tubular adenomas removed, external hemorrhoids.  Recommended next exam in 3 years.  . CORONARY ANGIOPLASTY WITH STENT PLACEMENT  2006   "2"  . CYSTOSCOPY W/ STONE MANIPULATION  ~ 2008  . ESOPHAGOGASTRODUODENOSCOPY N/A 01/22/2017   Dr. Oneida Alar: Mild to moderate gastritis.  No H. pylori  . ESOPHAGOGASTRODUODENOSCOPY N/A 05/31/2018   Procedure: ESOPHAGOGASTRODUODENOSCOPY (EGD);  Surgeon: Danie Binder, MD;  [facility];  Service: Endoscopy;  Laterality: N/A;  8:30am  . FRACTURE SURGERY    . INTRAVASCULAR PRESSURE WIRE/FFR STUDY N/A 06/08/2017   Procedure: INTRAVASCULAR PRESSURE WIRE/FFR STUDY;  Surgeon: Nelva Bush, MD;  Location: [place] CV LAB;  Service: Cardiovascular;  Laterality: N/A;  . LEFT HEART CATH AND CORONARY ANGIOGRAPHY N/A 06/08/2017   Procedure: LEFT HEART CATH AND CORONARY ANGIOGRAPHY;  Surgeon:  End, Harrell Gave, MD;  Location: [place] CV LAB;  Service: Cardiovascular;  Laterality: N/A;  . LEFT HEART CATH AND CORONARY ANGIOGRAPHY N/A 06/05/2018   Procedure: LEFT HEART CATH AND CORONARY ANGIOGRAPHY;  Surgeon: Belva Crome, MD;  Location: [place] CV LAB;  Service: Cardiovascular;  Laterality: N/A;  . LEFT HEART CATHETERIZATION WITH CORONARY ANGIOGRAM N/A 04/09/2014   Procedure: LEFT HEART CATHETERIZATION WITH CORONARY ANGIOGRAM;  Surgeon: Leonie Man, MD;  Location: [facility] CATH LAB;  Service: Cardiovascular;  Laterality: N/A;  . NASAL SEPTUM SURGERY  1999  . POLYPECTOMY   01/22/2017   Procedure: POLYPECTOMY;  Surgeon: Danie Binder, MD;  [facility];  Service: Endoscopy;;  right colon x4  . TEE WITHOUT CARDIOVERSION N/A 06/07/2018   Procedure: TRANSESOPHAGEAL ECHOCARDIOGRAM (TEE);  Surgeon: Jolaine Artist, MD;  Location: [facility] ENDOSCOPY;  Service: Cardiovascular;  Laterality: N/A;    Current Medications: Current Meds  Medication Sig  . albuterol (VENTOLIN HFA) 108 (90 Base) MCG/ACT inhaler Inhale 2 puffs into the lungs every 6 (six) hours as needed for wheezing or shortness of breath.  Marland Kitchen apixaban (ELIQUIS) 5 MG TABS tablet Take 1 tablet (5 mg total) by mouth every 12 (twelve) hours.  Marland Kitchen atorvastatin (LIPITOR) 80 MG tablet Take 80 mg by mouth at bedtime.   . dicyclomine (BENTYL) 10 MG capsule Take 1 capsule (10 mg total) by mouth 3 (three) times daily as needed (abdominal pain).  Marland Kitchen diphenhydramine-acetaminophen (TYLENOL PM) 25-500 MG TABS tablet Take 2 tablets by mouth at bedtime.   Marland Kitchen doxazosin (CARDURA) 8 MG tablet Take 8 mg by mouth at bedtime.   . Fluticasone Propionate (ALLERGY RELIEF NA) Place 2 sprays into the nose daily as needed (allergies).  . Hypromellose (ARTIFICIAL TEARS OP) Place 1-2 drops into both eyes daily as needed (for dry eyes).  Marland Kitchen lactulose (CHRONULAC) 10 GM/15ML solution Take 15 mLs (10 g total) by mouth 2 (two) times daily as needed for moderate constipation.  Marland Kitchen lisinopril (PRINIVIL,ZESTRIL) 2.5 MG tablet TAKE 1 TABLET BY MOUTH EVERY DAY (Patient taking differently: Take 2.5 mg by mouth at bedtime. )  . loratadine (CLARITIN) 10 MG tablet Take 10 mg by mouth daily.  . metoprolol succinate (TOPROL-XL) 25 MG 24 hr tablet Take 25 mg by mouth daily.   . nitroGLYCERIN (NITROSTAT) 0.4 MG SL tablet PLACE 1 TABLET (0.4 MG TOTAL) UNDER THE TONGUE EVERY 5 (FIVE) MINUTES AS NEEDED FOR CHEST PAIN.  Marland Kitchen pantoprazole (PROTONIX) 40 MG tablet TAKE 1 TABLET BY MOUTH DAILY (Patient taking differently: Take 40 mg by mouth daily)  . ranolazine  (RANEXA) 500 MG 12 hr tablet Take 500 mg by mouth 2 (two) times daily.   Marland Kitchen senna (SENOKOT) 8.6 MG tablet Take 3 tablets by mouth at bedtime.   . vitamin B-12 (CYANOCOBALAMIN) 1000 MCG tablet Take 1,000 mcg by mouth daily.     Allergies:   Vancomycin   Social History   Socioeconomic History  . Marital status: Married    Spouse name: Not on file  . Number of children: Not on file  . Years of education: Not on file  . Highest education level: Not on file  Occupational History    Comment: Retired  Tobacco Use  . Smoking status: Former Smoker    Packs/day: 1.50    Years: 22.00    Pack years: 33.00    Types: Cigarettes    Start date: 11/28/1942    Quit date: 02/07/1964    Years since quitting:  55.2  . Smokeless tobacco: Never Used  Substance and Sexual Activity  . Alcohol use: No    Alcohol/week: 0.0 standard drinks  . Drug use: No  . Sexual activity: Yes  Other Topics Concern  . Not on file  Social History Narrative  . Not on file   Social Determinants of Health   Financial Resource Strain: Low Risk   . Difficulty of Paying Living Expenses: Not hard at all  Food Insecurity: No Food Insecurity  . Worried About Charity fundraiser in the Last Year: Never true  . Ran Out of Food in the Last Year: Never true  Transportation Needs: No Transportation Needs  . Lack of Transportation (Medical): No  . Lack of Transportation (Non-Medical): No  Physical Activity: Sufficiently Active  . Days of Exercise per Week: 5 days  . Minutes of Exercise per Session: 30 min  Stress: No Stress Concern Present  . Feeling of Stress : Only a little  Social Connections: Slightly Isolated  . Frequency of Communication with Friends and Family: More than three times a week  . Frequency of Social Gatherings with Friends and Family: More than three times a week  . Attends Religious Services: 1 to 4 times per year  . Active Member of Clubs or Organizations: No  . Attends Archivist Meetings:  Never  . Marital Status: Married     Family History: The patient's family history includes Heart disease in his mother. There is no history of Colon cancer, Gastric cancer, or Esophageal cancer.  ROS:   Please see the history of present illness.     All other systems reviewed and are negative.  EKGs/Labs/Other Studies Reviewed:    The following studies were reviewed today: TEE 16-Jul-2018- normal LVF, moderate LVH, mildly elevated LA OP 14 day ZIO- no PAF  EKG:  EKG is not ordered today.  The ekg ordered 04/14/2019 demonstrates NSR, SB-59, 1st degree AVB  Recent Labs: 06/03/2018: TSH 2.495 06/04/2018: Magnesium 2.2 06/08/2018: ALT 30; BUN 19; Creatinine, Ser 1.09; Hemoglobin 12.9; Platelets 156; Potassium 4.6; Sodium 138  Recent Lipid Panel    Component Value Date/Time   CHOL 123 06/05/2018 0539   TRIG 37 06/05/2018 0539   HDL 50 06/05/2018 0539   CHOLHDL 2.5 06/05/2018 0539   VLDL 7 06/05/2018 0539   LDLCALC 66 06/05/2018 0539    Physical Exam:    VS:  BP (!) 144/80   Pulse 73   Ht 5\' 9"  (1.753 m)   Wt 166 lb 9.6 oz (75.6 kg)   SpO2 97%   BMI 24.60 kg/m     Wt Readings from Last 3 Encounters:  05/13/19 166 lb 9.6 oz (75.6 kg)  04/10/19 165 lb (74.8 kg)  07/09/18 167 lb (75.8 kg)     GEN:  Well nourished, well developed in no acute distress HEENT: Normal NECK: No JVD; No carotid bruits LYMPHATICS: No lymphadenopathy CARDIAC: RRR, no murmurs, rubs, gallops RESPIRATORY:  Clear to auscultation without rales, wheezing or rhonchi  ABDOMEN: Soft, non-tender, non-distended MUSCULOSKELETAL:  No edema; No deformity  SKIN: Warm and dry NEUROLOGIC:  Alert and oriented x 3 PSYCHIATRIC:  Normal affect   ASSESSMENT:    Dizziness Today he described dizziness when looking up- ? If C-spine DJD may be contributing.  Check carotid dopplers and Lexiscan Myoview to be complete, if negative consider further work up per PCP.   CAD S/P percutaneous coronary angioplasty -  PCI to  LAD in 2006-  Patent site at cath in 2016, 2019, and April 2020 with residual 60-70% Dx1, moderate RCA disease. He has had chronic chest pain since but insists "something is wrong".    PAF (paroxysmal atrial fibrillation) ([place]) S/P DCCV May 2020- no recurrence on OP Monitor in June 2020 and most recently March 2021  Essential hypertension Controlled  Chronic anticoagulation CHADS VASC=4, he is on Eliquis 5mg  BID  PLAN:    Check carotid dopplers and Lexiscan Myoview, if negative f/u with PCP.    Medication Adjustments/Labs and Tests Ordered: Current medicines are reviewed at length with the patient today.  Concerns regarding medicines are outlined above.  Orders Placed This Encounter  Procedures  . NM Myocar Multi W/Spect W/Wall Motion / EF  . VAS US CAROTID   No orders of the defined types were placed in this encounter.   Patient Instructions  Medication Instructions:  Continue all current medications.  Labwork: none  Testing/Procedures:  Your physician has requested that you have a carotid duplex. This test is an ultrasound of the carotid arteries in your neck. It looks at blood flow through these arteries that supply the brain with blood. Allow one hour for this exam. There are no restrictions or special instructions. Your physician has requested that you have a lexiscan myoview. For further information please visit [email or web address]. Please follow instruction sheet, as given.  Office will contact with results via phone or letter.    Follow-Up: 6 months   Any Other Special Instructions Will Be Listed Below (If Applicable).  If you need a refill on your cardiac medications before your next appointment, please call your pharmacy.     Signed, Kerin Ransom, PA-C  05/13/2019 1:20 PM    [facility]

## 2019-05-13 NOTE — Patient Instructions (Addendum)
Medication Instructions:  Continue all current medications.  Labwork: none  Testing/Procedures:  Your physician has requested that you have a carotid duplex. This test is an ultrasound of the carotid arteries in your neck. It looks at blood flow through these arteries that supply the brain with blood. Allow one hour for this exam. There are no restrictions or special instructions. Your physician has requested that you have a lexiscan myoview. For further information please visit [email or web address]. Please follow instruction sheet, as given.  Office will contact with results via phone or letter.    Follow-Up: 6 months   Any Other Special Instructions Will Be Listed Below (If Applicable).  If you need a refill on your cardiac medications before your next appointment, please call your pharmacy.

## 2019-05-13 NOTE — Assessment & Plan Note (Signed)
Today he described dizziness when looking up

## 2019-05-13 NOTE — Telephone Encounter (Signed)
°  Precert needed for: Lexiscan  Location: [facility]    Date: May 23, 2019

## 2019-05-18 IMAGING — CR DG CHEST 2V
2 series · 2 of 2 positions shown · non-contrast
Comparison: Chest radiograph 05/07/2017

CLINICAL DATA: Patient with shortness of breath.  Chest pain

EXAM:
CHEST - 2 VIEW

[chest pa]
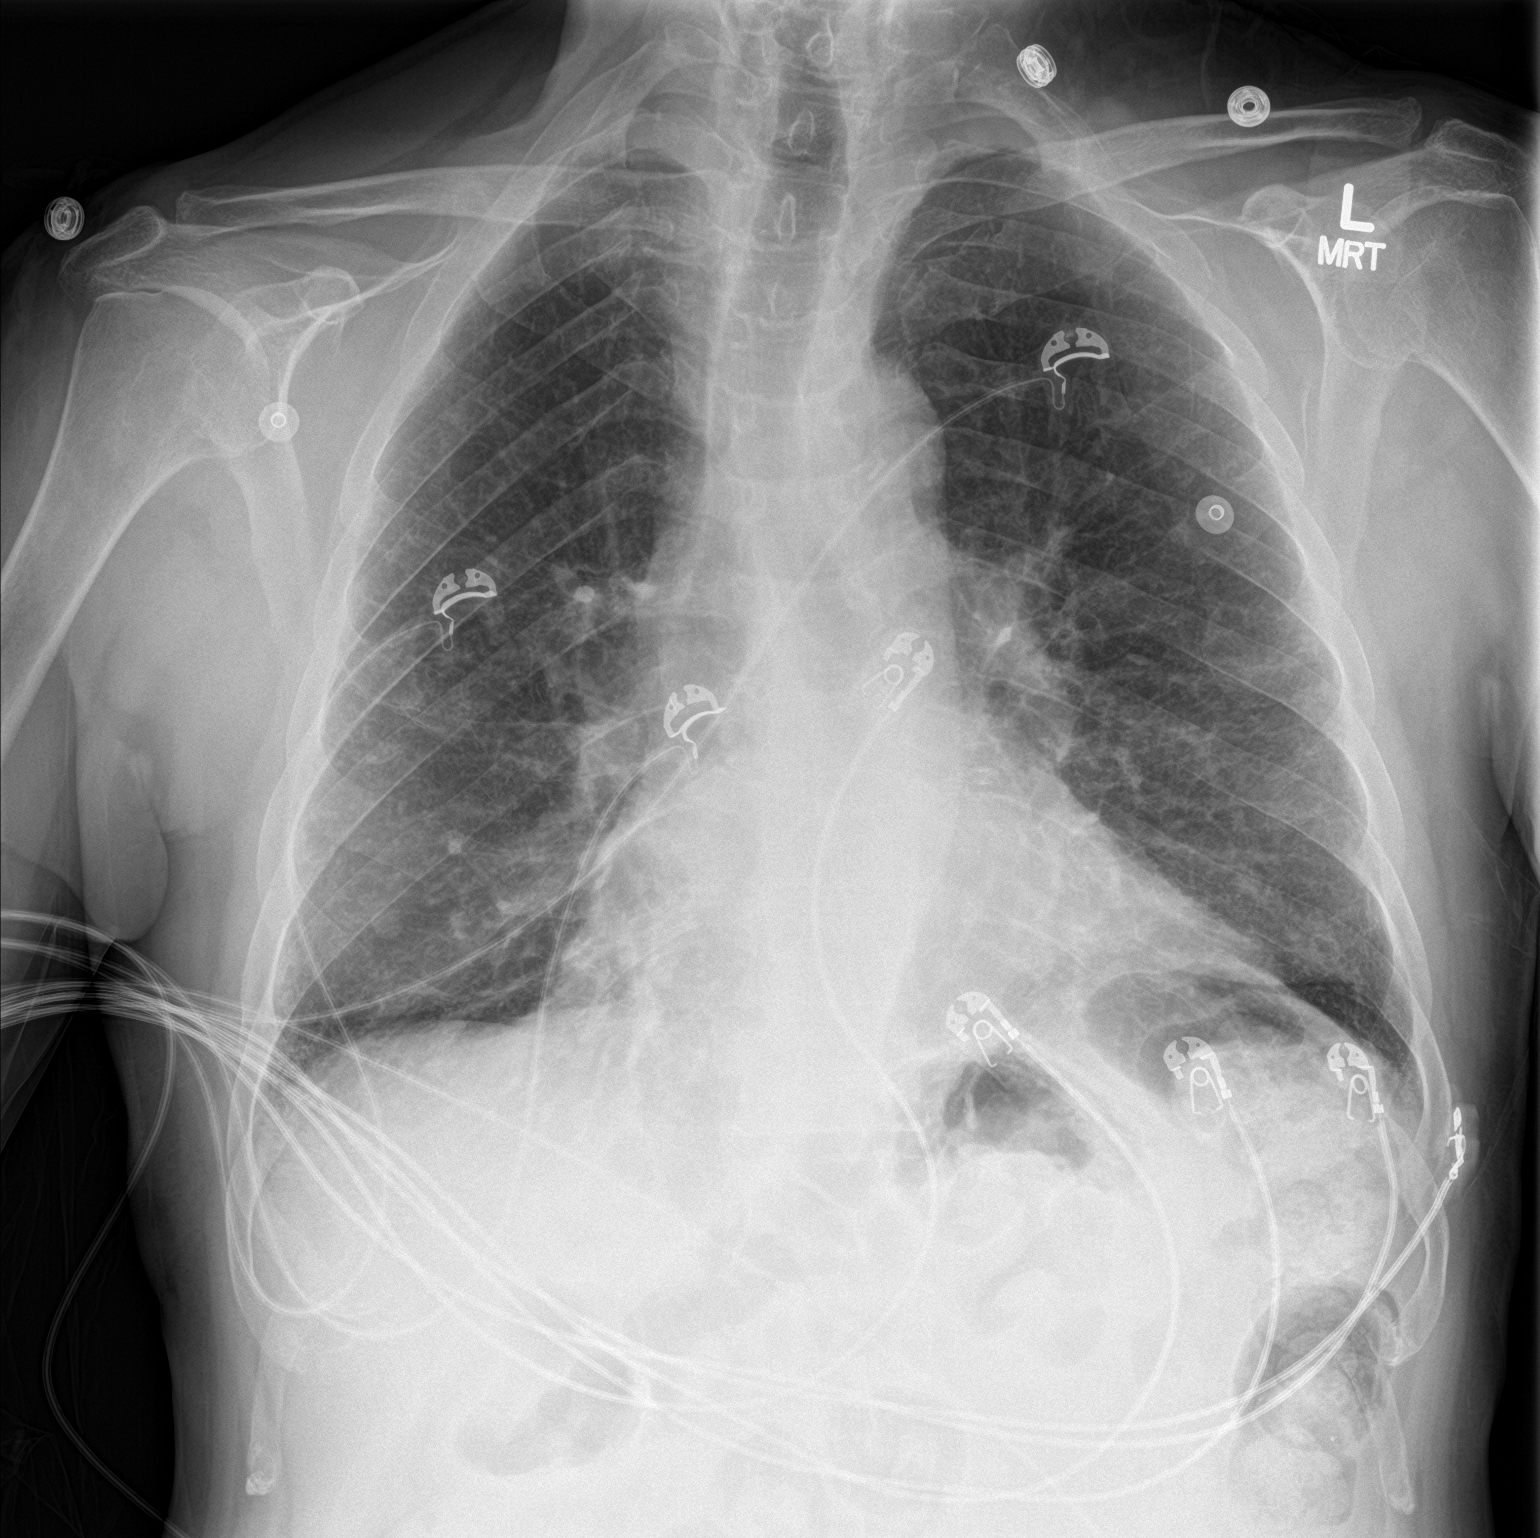

[chest lat]
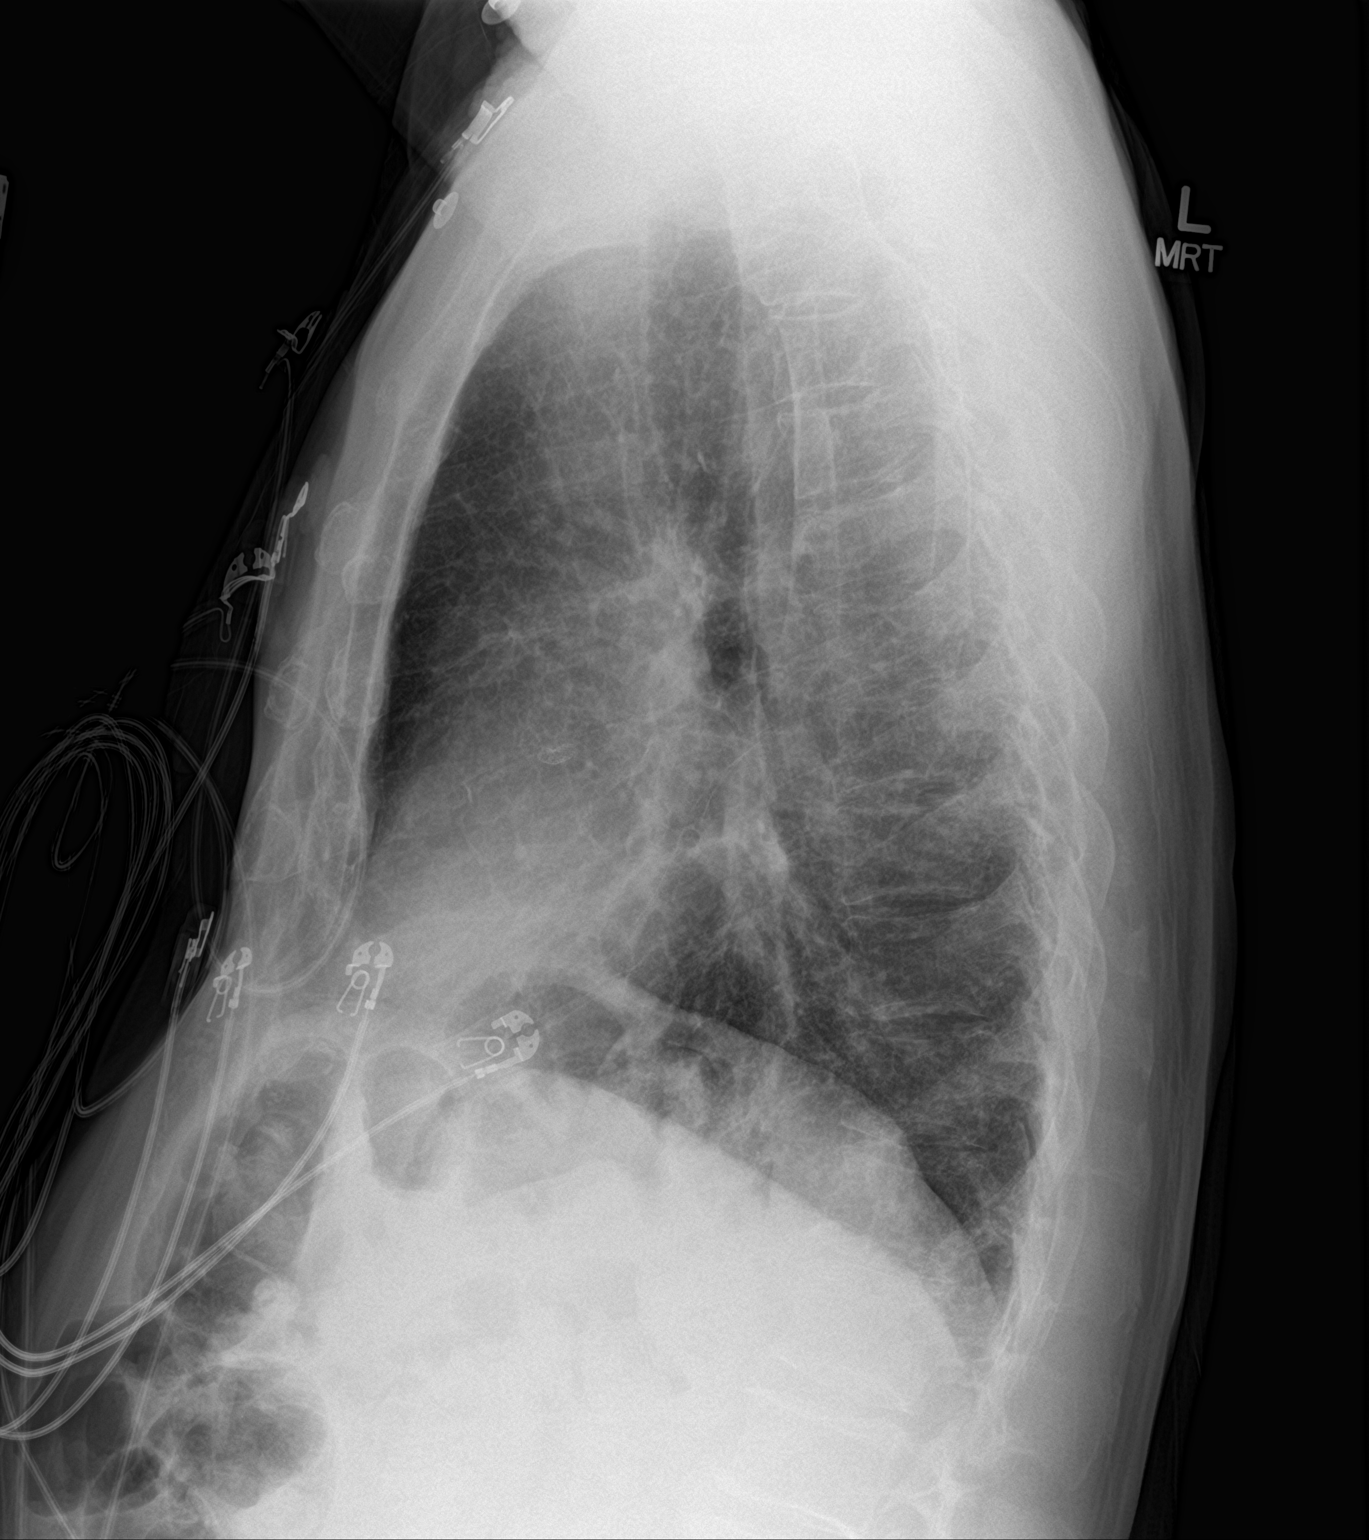

[2 of 2 positions shown; findings below may reference images not displayed]

FINDINGS: Monitoring leads overlie the patient. Stable cardiomegaly.
Tortuosity of the thoracic aorta. Re demonstrated chronic
interstitial opacities bilaterally. No superimposed area of
pulmonary consolidation. No pleural effusion or pneumothorax.
Thoracic spine degenerative changes.
IMPRESSION: No acute cardiopulmonary process.  Chronic changes.

## 2019-05-23 ENCOUNTER — Encounter (HOSPITAL_COMMUNITY): Payer: Self-pay

## 2019-05-23 ENCOUNTER — Encounter (HOSPITAL_COMMUNITY)
Admission: RE | Admit: 2019-05-23 | Discharge: 2019-05-23 | Disposition: A | Discharge status: Home / Self Care | Payer: PPO | Source: Ambulatory Visit | Attending: Cardiology | Admitting: Cardiology

## 2019-05-23 ENCOUNTER — Other Ambulatory Visit: Payer: Self-pay

## 2019-05-23 ENCOUNTER — Encounter (HOSPITAL_BASED_OUTPATIENT_CLINIC_OR_DEPARTMENT_OTHER)
Admission: RE | Admit: 2019-05-23 | Discharge: 2019-05-23 | Disposition: A | Discharge status: Home / Self Care | Payer: PPO | Source: Ambulatory Visit | Attending: Cardiology | Admitting: Cardiology

## 2019-05-23 DIAGNOSIS — R0609 Other forms of dyspnea: Secondary | ICD-10-CM

## 2019-05-23 DIAGNOSIS — R42 Dizziness and giddiness: Secondary | ICD-10-CM | POA: Insufficient documentation

## 2019-05-23 DIAGNOSIS — R0600 Dyspnea, unspecified: Secondary | ICD-10-CM

## 2019-05-23 LAB — NM MYOCAR MULTI W/SPECT W/WALL MOTION / EF
LV dias vol: 87 mL (ref 62–150)
LV sys vol: 24 mL
Peak HR: 91 {beats}/min
RATE: 0.43
Rest HR: 60 {beats}/min
SDS: 0
SRS: 0
SSS: 0
TID: 1.09

## 2019-05-23 MED ORDER — TECHNETIUM TC 99M TETROFOSMIN IV KIT
30.0000 | PACK | Freq: Once | INTRAVENOUS | Status: AC | PRN
  Administered 2019-05-23: 32 via INTRAVENOUS

## 2019-05-23 MED ORDER — REGADENOSON 0.4 MG/5ML IV SOLN
INTRAVENOUS | Status: AC
  Administered 2019-05-23: 0.4 mg via INTRAVENOUS
  Filled 2019-05-23: qty 5

## 2019-05-23 MED ORDER — SODIUM CHLORIDE FLUSH 0.9 % IV SOLN
INTRAVENOUS | Status: AC
  Administered 2019-05-23: 10 mL via INTRAVENOUS
  Filled 2019-05-23: qty 10

## 2019-05-23 MED ORDER — TECHNETIUM TC 99M TETROFOSMIN IV KIT
10.0000 | PACK | Freq: Once | INTRAVENOUS | Status: AC | PRN
  Administered 2019-05-23: 11 via INTRAVENOUS

## 2019-05-26 ENCOUNTER — Telehealth: Payer: Self-pay | Admitting: Cardiology

## 2019-05-26 NOTE — Telephone Encounter (Signed)
Heart monitor-[employer]

## 2019-05-26 NOTE — Telephone Encounter (Signed)
Patient called requesting recent test results. 

## 2019-05-28 NOTE — Progress Notes (Signed)
Pt made aware, voiced understanding. Copy to pcp.  

## 2019-05-29 NOTE — Telephone Encounter (Signed)
Please let the patient know his stress test looked normal.  Patient informed. Copy sent to PCP  Advised that heart monitor has not been resulted on yet

## 2019-05-30 ENCOUNTER — Ambulatory Visit: Payer: PPO | Admitting: Cardiology

## 2019-06-05 ENCOUNTER — Other Ambulatory Visit: Payer: Self-pay

## 2019-06-05 ENCOUNTER — Ambulatory Visit (INDEPENDENT_AMBULATORY_CARE_PROVIDER_SITE_OTHER): Payer: PPO

## 2019-06-05 DIAGNOSIS — R0600 Dyspnea, unspecified: Secondary | ICD-10-CM | POA: Diagnosis not present

## 2019-06-05 DIAGNOSIS — R42 Dizziness and giddiness: Secondary | ICD-10-CM

## 2019-06-05 DIAGNOSIS — R0609 Other forms of dyspnea: Secondary | ICD-10-CM

## 2019-06-06 DIAGNOSIS — I4891 Unspecified atrial fibrillation: Secondary | ICD-10-CM | POA: Diagnosis not present

## 2019-06-06 DIAGNOSIS — E7849 Other hyperlipidemia: Secondary | ICD-10-CM | POA: Diagnosis not present

## 2019-06-06 DIAGNOSIS — I10 Essential (primary) hypertension: Secondary | ICD-10-CM | POA: Diagnosis not present

## 2019-06-13 DIAGNOSIS — L570 Actinic keratosis: Secondary | ICD-10-CM | POA: Diagnosis not present

## 2019-06-13 DIAGNOSIS — Z6824 Body mass index (BMI) 24.0-24.9, adult: Secondary | ICD-10-CM | POA: Diagnosis not present

## 2019-06-13 DIAGNOSIS — H8110 Benign paroxysmal vertigo, unspecified ear: Secondary | ICD-10-CM | POA: Diagnosis not present

## 2019-08-04 DIAGNOSIS — D519 Vitamin B12 deficiency anemia, unspecified: Secondary | ICD-10-CM | POA: Diagnosis not present

## 2019-08-04 DIAGNOSIS — E782 Mixed hyperlipidemia: Secondary | ICD-10-CM | POA: Diagnosis not present

## 2019-08-04 DIAGNOSIS — R946 Abnormal results of thyroid function studies: Secondary | ICD-10-CM | POA: Diagnosis not present

## 2019-08-04 DIAGNOSIS — I10 Essential (primary) hypertension: Secondary | ICD-10-CM | POA: Diagnosis not present

## 2019-08-04 DIAGNOSIS — D529 Folate deficiency anemia, unspecified: Secondary | ICD-10-CM | POA: Diagnosis not present

## 2019-08-04 DIAGNOSIS — D649 Anemia, unspecified: Secondary | ICD-10-CM | POA: Diagnosis not present

## 2019-08-04 DIAGNOSIS — K2100 Gastro-esophageal reflux disease with esophagitis, without bleeding: Secondary | ICD-10-CM | POA: Diagnosis not present

## 2019-08-04 DIAGNOSIS — E7800 Pure hypercholesterolemia, unspecified: Secondary | ICD-10-CM | POA: Diagnosis not present

## 2019-08-06 DIAGNOSIS — I10 Essential (primary) hypertension: Secondary | ICD-10-CM | POA: Diagnosis not present

## 2019-08-06 DIAGNOSIS — I4891 Unspecified atrial fibrillation: Secondary | ICD-10-CM | POA: Diagnosis not present

## 2019-08-06 DIAGNOSIS — D649 Anemia, unspecified: Secondary | ICD-10-CM | POA: Diagnosis not present

## 2019-08-06 DIAGNOSIS — E7849 Other hyperlipidemia: Secondary | ICD-10-CM | POA: Diagnosis not present

## 2019-08-08 DIAGNOSIS — E782 Mixed hyperlipidemia: Secondary | ICD-10-CM | POA: Diagnosis not present

## 2019-08-08 DIAGNOSIS — I10 Essential (primary) hypertension: Secondary | ICD-10-CM | POA: Diagnosis not present

## 2019-08-08 DIAGNOSIS — Z6824 Body mass index (BMI) 24.0-24.9, adult: Secondary | ICD-10-CM | POA: Diagnosis not present

## 2019-08-08 DIAGNOSIS — I482 Chronic atrial fibrillation, unspecified: Secondary | ICD-10-CM | POA: Diagnosis not present

## 2019-08-08 DIAGNOSIS — D6869 Other thrombophilia: Secondary | ICD-10-CM | POA: Diagnosis not present

## 2019-08-08 DIAGNOSIS — Z0001 Encounter for general adult medical examination with abnormal findings: Secondary | ICD-10-CM | POA: Diagnosis not present

## 2019-08-08 DIAGNOSIS — I25111 Atherosclerotic heart disease of native coronary artery with angina pectoris with documented spasm: Secondary | ICD-10-CM | POA: Diagnosis not present

## 2019-08-08 DIAGNOSIS — N401 Enlarged prostate with lower urinary tract symptoms: Secondary | ICD-10-CM | POA: Diagnosis not present

## 2019-09-05 DIAGNOSIS — E7849 Other hyperlipidemia: Secondary | ICD-10-CM | POA: Diagnosis not present

## 2019-09-05 DIAGNOSIS — I482 Chronic atrial fibrillation, unspecified: Secondary | ICD-10-CM | POA: Diagnosis not present

## 2019-09-05 DIAGNOSIS — I25111 Atherosclerotic heart disease of native coronary artery with angina pectoris with documented spasm: Secondary | ICD-10-CM | POA: Diagnosis not present

## 2019-09-05 DIAGNOSIS — I10 Essential (primary) hypertension: Secondary | ICD-10-CM | POA: Diagnosis not present

## 2019-10-07 DIAGNOSIS — I482 Chronic atrial fibrillation, unspecified: Secondary | ICD-10-CM | POA: Diagnosis not present

## 2019-10-07 DIAGNOSIS — I25111 Atherosclerotic heart disease of native coronary artery with angina pectoris with documented spasm: Secondary | ICD-10-CM | POA: Diagnosis not present

## 2019-10-07 DIAGNOSIS — E7849 Other hyperlipidemia: Secondary | ICD-10-CM | POA: Diagnosis not present

## 2019-10-07 DIAGNOSIS — I10 Essential (primary) hypertension: Secondary | ICD-10-CM | POA: Diagnosis not present

## 2019-10-10 DIAGNOSIS — Z20828 Contact with and (suspected) exposure to other viral communicable diseases: Secondary | ICD-10-CM | POA: Diagnosis not present

## 2019-11-03 DIAGNOSIS — Z6825 Body mass index (BMI) 25.0-25.9, adult: Secondary | ICD-10-CM | POA: Diagnosis not present

## 2019-11-03 DIAGNOSIS — R300 Dysuria: Secondary | ICD-10-CM | POA: Diagnosis not present

## 2019-11-03 DIAGNOSIS — M545 Low back pain: Secondary | ICD-10-CM | POA: Diagnosis not present

## 2019-11-04 DIAGNOSIS — R300 Dysuria: Secondary | ICD-10-CM | POA: Diagnosis not present

## 2019-11-04 DIAGNOSIS — R102 Pelvic and perineal pain: Secondary | ICD-10-CM | POA: Diagnosis not present

## 2019-11-04 DIAGNOSIS — K8020 Calculus of gallbladder without cholecystitis without obstruction: Secondary | ICD-10-CM | POA: Diagnosis not present

## 2019-11-04 DIAGNOSIS — N200 Calculus of kidney: Secondary | ICD-10-CM | POA: Diagnosis not present

## 2019-11-04 DIAGNOSIS — M545 Low back pain: Secondary | ICD-10-CM | POA: Diagnosis not present

## 2019-11-04 DIAGNOSIS — I700 Atherosclerosis of aorta: Secondary | ICD-10-CM | POA: Diagnosis not present

## 2019-11-06 DIAGNOSIS — I482 Chronic atrial fibrillation, unspecified: Secondary | ICD-10-CM | POA: Diagnosis not present

## 2019-11-06 DIAGNOSIS — E7849 Other hyperlipidemia: Secondary | ICD-10-CM | POA: Diagnosis not present

## 2019-11-06 DIAGNOSIS — I10 Essential (primary) hypertension: Secondary | ICD-10-CM | POA: Diagnosis not present

## 2019-11-07 ENCOUNTER — Ambulatory Visit: Payer: PPO | Admitting: Cardiology

## 2019-11-07 NOTE — Progress Notes (Deleted)
Clinical Summary Eric Benitez is a 79 y.o.male  1. CAD - hx of prior stenting 10 years ago - cath 04/09/14 with patent coronaries.  06/2017 cath patent vessels. LVEDP 6.  - no recent chest pain. No SOB/DOE - compliant with meds - starting at [employer], riding stationary bike x 20 minutes without troubles. - 05/2018 during admission had cath - 05/2018 cath: patent vessels and stents, ostial D1 60-70% stenosis  - no recent chest pain  05/2019 nuclear stress: no ischemia   2. SOB - 10/2015 PFTs mild ventilatory defect -05/2017 echo LVEF 60-65%, no WMAs, diastolic function not described.  - 05/2017 lexiscan small apical ischemia, low risk - 06/2017 cath patent vessels. LVEDP 6.  - no significant SOB/DOE   3. Atrial flutter - recent admission 05/2018 with new onset aflutter. Symptoms were recurring chest tightness, neck discomfort, fatigue. DId not have significant palpitations.  - s/p TEE/DCCV that admission.   - no recent palpitatoins.   - 04/2019 event monitor: no significant arrhythmias  4. Generalized fatigue - generalized fatigue, leg weakness. Headache back of head most at night.  - no chest pain. No recent palpitations.  - seen by pcp recently.  Past Medical History:  Diagnosis Date  . Arthritis    "neck, hands, fingers" (04/09/2014)  . Bleeds easily ([place])   . CAD (coronary artery disease)    a. s/p prior stenting of LAD b.  patent stent by cath in 2016 and 06/2017 --> residual 60% D1 stenosis and moderate disease along the RCA  . Hyperlipemia   . Hypertension   . Kidney stones    "I've had several; had to go in and get them once" (04/09/2014)  . Pneumonia 1940's     Allergies  Allergen Reactions  . Vancomycin Rash     Current Outpatient Medications  Medication Sig Dispense Refill  . albuterol (VENTOLIN HFA) 108 (90 Base) MCG/ACT inhaler Inhale 2 puffs into the lungs every 6 (six) hours as needed for wheezing or shortness of breath.      Marland Kitchen apixaban (ELIQUIS) 5 MG TABS tablet Take 1 tablet (5 mg total) by mouth every 12 (twelve) hours. 60 tablet 11  . atorvastatin (LIPITOR) 80 MG tablet Take 80 mg by mouth at bedtime.     . dicyclomine (BENTYL) 10 MG capsule Take 1 capsule (10 mg total) by mouth 3 (three) times daily as needed (abdominal pain). 30 capsule 0  . diphenhydramine-acetaminophen (TYLENOL PM) 25-500 MG TABS tablet Take 2 tablets by mouth at bedtime.     Marland Kitchen doxazosin (CARDURA) 8 MG tablet Take 8 mg by mouth at bedtime.     . Fluticasone Propionate (ALLERGY RELIEF NA) Place 2 sprays into the nose daily as needed (allergies).    . Hypromellose (ARTIFICIAL TEARS OP) Place 1-2 drops into both eyes daily as needed (for dry eyes).    Marland Kitchen lactulose (CHRONULAC) 10 GM/15ML solution Take 15 mLs (10 g total) by mouth 2 (two) times daily as needed for moderate constipation. 236 mL 0  . lisinopril (PRINIVIL,ZESTRIL) 2.5 MG tablet TAKE 1 TABLET BY MOUTH EVERY DAY (Patient taking differently: Take 2.5 mg by mouth at bedtime. ) 90 tablet 1  . loratadine (CLARITIN) 10 MG tablet Take 10 mg by mouth daily.    . metoprolol succinate (TOPROL-XL) 25 MG 24 hr tablet Take 25 mg by mouth daily.     . nitroGLYCERIN (NITROSTAT) 0.4 MG SL tablet PLACE 1 TABLET (0.4 MG TOTAL) UNDER THE TONGUE  EVERY 5 (FIVE) MINUTES AS NEEDED FOR CHEST PAIN. 25 tablet 3  . pantoprazole (PROTONIX) 40 MG tablet TAKE 1 TABLET BY MOUTH DAILY (Patient taking differently: Take 40 mg by mouth daily) 30 tablet 6  . ranolazine (RANEXA) 500 MG 12 hr tablet Take 500 mg by mouth 2 (two) times daily.     Marland Kitchen senna (SENOKOT) 8.6 MG tablet Take 3 tablets by mouth at bedtime.     . vitamin B-12 (CYANOCOBALAMIN) 1000 MCG tablet Take 1,000 mcg by mouth daily.     No current facility-administered medications for this visit.     Past Surgical History:  Procedure Laterality Date  . ANKLE FRACTURE SURGERY Right 2009  . CARDIAC CATHETERIZATION  1990's X 2;  04/09/2014  . CARDIOVERSION N/A  06/07/2018   Procedure: CARDIOVERSION;  Surgeon: Jolaine Artist, MD;  Location: [facility] ENDOSCOPY;  Service: Cardiovascular;  Laterality: N/A;  . COLONOSCOPY N/A 01/22/2017   Dr. fields: 6 tubular adenomas removed, external hemorrhoids.  Recommended next exam in 3 years.  . CORONARY ANGIOPLASTY WITH STENT PLACEMENT  2006   "2"  . CYSTOSCOPY W/ STONE MANIPULATION  ~ 2008  . ESOPHAGOGASTRODUODENOSCOPY N/A 01/22/2017   Dr. Oneida Alar: Mild to moderate gastritis.  No H. pylori  . ESOPHAGOGASTRODUODENOSCOPY N/A 05/31/2018   Procedure: ESOPHAGOGASTRODUODENOSCOPY (EGD);  Surgeon: Danie Binder, MD;  Location: [facility] ENDO SUITE;  Service: Endoscopy;  Laterality: N/A;  8:30am  . FRACTURE SURGERY    . INTRAVASCULAR PRESSURE WIRE/FFR STUDY N/A 06/08/2017   Procedure: INTRAVASCULAR PRESSURE WIRE/FFR STUDY;  Surgeon: Nelva Bush, MD;  Location: [place] CV LAB;  Service: Cardiovascular;  Laterality: N/A;  . LEFT HEART CATH AND CORONARY ANGIOGRAPHY N/A 06/08/2017   Procedure: LEFT HEART CATH AND CORONARY ANGIOGRAPHY;  Surgeon: Nelva Bush, MD;  Location: [place] CV LAB;  Service: Cardiovascular;  Laterality: N/A;  . LEFT HEART CATH AND CORONARY ANGIOGRAPHY N/A 06/05/2018   Procedure: LEFT HEART CATH AND CORONARY ANGIOGRAPHY;  Surgeon: Belva Crome, MD;  Location: [place] CV LAB;  Service: Cardiovascular;  Laterality: N/A;  . LEFT HEART CATHETERIZATION WITH CORONARY ANGIOGRAM N/A 04/09/2014   Procedure: LEFT HEART CATHETERIZATION WITH CORONARY ANGIOGRAM;  Surgeon: Leonie Man, MD;  Location: [facility] CATH LAB;  Service: Cardiovascular;  Laterality: N/A;  . NASAL SEPTUM SURGERY  1999  . POLYPECTOMY  01/22/2017   Procedure: POLYPECTOMY;  Surgeon: Danie Binder, MD;  Location: [facility] ENDO SUITE;  Service: Endoscopy;;  right colon x4  . TEE WITHOUT CARDIOVERSION N/A 06/07/2018   Procedure: TRANSESOPHAGEAL ECHOCARDIOGRAM (TEE);  Surgeon: Jolaine Artist, MD;  Location: [facility] ENDOSCOPY;  Service:  Cardiovascular;  Laterality: N/A;     Allergies  Allergen Reactions  . Vancomycin Rash      Family History  Problem Relation Age of Onset  . Heart disease Mother   . Colon cancer Neg Hx   . Gastric cancer Neg Hx   . Esophageal cancer Neg Hx      Social History Eric Benitez reports that he quit smoking about 55 years ago. His smoking use included cigarettes. He started smoking about 76 years ago. He has a 33.00 pack-year smoking history. He has never used smokeless tobacco. Eric Benitez reports no history of alcohol use.   Review of Systems CONSTITUTIONAL: No weight loss, fever, chills, weakness or fatigue.  HEENT: Eyes: No visual loss, blurred vision, double vision or yellow sclerae.No hearing loss, sneezing, congestion, runny nose or sore throat.  SKIN: No rash or itching.  CARDIOVASCULAR:  RESPIRATORY: No shortness of breath, cough or sputum.  GASTROINTESTINAL: No anorexia, nausea, vomiting or diarrhea. No abdominal pain or blood.  GENITOURINARY: No burning on urination, no polyuria NEUROLOGICAL: No headache, dizziness, syncope, paralysis, ataxia, numbness or tingling in the extremities. No change in bowel or bladder control.  MUSCULOSKELETAL: No muscle, back pain, joint pain or stiffness.  LYMPHATICS: No enlarged nodes. No history of splenectomy.  PSYCHIATRIC: No history of depression or anxiety.  ENDOCRINOLOGIC: No reports of sweating, cold or heat intolerance. No polyuria or polydipsia.  Marland Kitchen   Physical Examination There were no vitals filed for this visit. There were no vitals filed for this visit.  Gen: resting comfortably, no acute distress HEENT: no scleral icterus, pupils equal round and reactive, no palptable cervical adenopathy,  CV Resp: Clear to auscultation bilaterally GI: abdomen is soft, non-tender, non-distended, normal bowel sounds, no hepatosplenomegaly MSK: extremities are warm, no edema.  Skin: warm, no rash Neuro:  no focal deficits Psych:  appropriate affect   Diagnostic Studies  04/09/14 Cath Hemodynamics:  Central Aortic Pressure / Mean: 126/67/92 mmHg  Left Ventricular Pressure / LVEDP: 128/10/16 mmHg  Left Ventriculography:  EF:50-65 %  Wall Motion:Normal to hyperdynamic  Coronary Anatomy:  Dominance: Co-dominant  Left Main: Large-caliber vessel that is short. Bifurcates into the LAD and Circumflex. Angiographically normal. FAO:ZHYQM-VHQIONG vessel with a very large proximal D1 Kaylinn Dedic that comes off just prior to a widely patent stent in the early mid vessel. There is a small second diagonal Moon Budde that arises from the stented segment and another small third diagonal Ennifer Harston more distally. The distal vessel tapers into a moderate caliber vessel that wraps around the apex perfusing the distal third of the inferoapex.  D1:Large-caliber proximal Tayleigh Wetherell with ostial 40-50% focal stenosis. The vessel then courses almost of the ramus intermedius. The vessel is very tortuous gives rise to several mold-moderate caliber branches distally. Beyond the ostial lesion, the vessel remains angiographically normal.  Left Circumflex:Moderate large-caliber, codominant vessel. There is a small moderate caliber first obtuse marginal Larue Lightner before the vessel courses into the AV groove. In the mid AV groove there is a hairpin turn as the vessel courses distally to provide to posterior lateral branches with the first being moderate caliber and second mean small caliber.  OM1:Small moderate caliber Fines Kimberlin that does not cover large distribution. Tortuous but free of disease.   RCA: Moderate large-caliber, codominant vessel. Minimal luminal irregularities are vessel tapers down to [place] more moderate caliber right posterior descending arteries. There is a very small posterior lateral system.  After reviewing the initial angiography, no culprit lesion was identified.   10/2015 PFTs Mild ventilatory defect  11/2015 AAA screen  [place] No aneurysm  04/2014 echo Study Conclusions  - Left ventricle: The cavity size was normal. Wall thickness was normal. Systolic function was normal. The estimated ejection fraction was in the range of 60% to 65%. Wall motion was normal; there were no regional wall motion abnormalities. Left ventricular diastolic function parameters were normal. - Mitral valve: There was mild regurgitation.  05/2017 nuclear stress  No diagnostic ST segment changes to indicate ischemia.  Small, mild intensity, apical inferior defect that is reversible and consistent with a small ischemic territory. Fixed inferior defect is more consistent with soft tissue attenuation.  This is a low risk study.  Nuclear stress EF: 76%.   06/2017 cath 1. Stable appearance of the coronary arteries since 2016, with 60% ostial D1 stenosis (FFR 0.90), patent mid LAD stent, and mild to  moderate diffuse RCA disease. 2. Normal to hyperdynamic left ventricular contraction with low filling pressure (LVEDP 6 mmHg).   05/2018 cath  Left dominant coronary anatomy  Left main is widely patent  LAD including the mid vessel stent is widely patent. The ostial first diagonal contains 60 to 70% stenosis, unchanged from 1 year ago.  The circumflex territory is relatively small, contains irregularities in the mid body, and is unchanged from prior.  Right coronary contains diffuse luminal irregularities before giving origin to the PDA. No focal or significant obstruction is noted.  Low normal LVEF, 50%. LVEDP is normal.  RECOMMENDATIONS:   No change in coronary anatomy.  Further management of atrial flutter.   04/2019 event monitor  14 day event monitor  Min HR 51, Max HR 128, Avg HR 68  Reported symptoms correlated with sinus rhythm and mild sinus tachycardia  No significant arrhythmias  05/2019 carotid US Summary:  Right Carotid: Velocities in the right ICA are consistent with a 1-39%    stenosis.   Left Carotid: Velocities in the left ICA are consistent with a 1-39%  stenosis.   Vertebrals: Bilateral vertebral arteries demonstrate antegrade flow.  Subclavians: Normal flow hemodynamics were seen in bilateral subclavian        arteries.   05/2019 nuclear stress  There was no ST segment deviation noted during stress.  The study is normal. No myocardial ischemia or scar.  This is a low risk study.  Nuclear stress EF: 72%.  Assessment and Plan   1. Generalized fatigue - nonspecific generalized symptoms without clear cardiopulmonary symptoms - unclear etiology, we will obtain labs includnig cbc and TSH - EKG shows he is mainting SR. We will obtain a 1 week event monitor to see if having paroxysmal aflutter as the etiology.  - if ongoing symptoms may consider event monitor to see if he is paroxysmal - further workup for generalized fatigue by pcp  2. Atrial flutter - s/p recent DCCV - recent nonspecific episodes of fatigue. Obtain 1 week event monitor to see if associated with recurrent paroxysmal aflutter. EKG today shows NSR     Arnoldo Lenis, M.D., F.A.C.C.

## 2019-11-12 DIAGNOSIS — M2569 Stiffness of other specified joint, not elsewhere classified: Secondary | ICD-10-CM | POA: Diagnosis not present

## 2019-11-12 DIAGNOSIS — M791 Myalgia, unspecified site: Secondary | ICD-10-CM | POA: Diagnosis not present

## 2019-11-17 DIAGNOSIS — K8042 Calculus of bile duct with acute cholecystitis without obstruction: Secondary | ICD-10-CM | POA: Diagnosis not present

## 2019-11-17 DIAGNOSIS — K5900 Constipation, unspecified: Secondary | ICD-10-CM | POA: Diagnosis not present

## 2019-11-17 DIAGNOSIS — K7689 Other specified diseases of liver: Secondary | ICD-10-CM | POA: Diagnosis not present

## 2019-11-17 DIAGNOSIS — N281 Cyst of kidney, acquired: Secondary | ICD-10-CM | POA: Diagnosis not present

## 2019-11-17 DIAGNOSIS — K8020 Calculus of gallbladder without cholecystitis without obstruction: Secondary | ICD-10-CM | POA: Diagnosis not present

## 2019-11-17 DIAGNOSIS — K769 Liver disease, unspecified: Secondary | ICD-10-CM | POA: Diagnosis not present

## 2019-12-04 ENCOUNTER — Encounter: Payer: Self-pay | Admitting: Gastroenterology

## 2019-12-04 NOTE — Progress Notes (Signed)
Referring Provider: Caryl Bis, MD Primary Care Physician:  Caryl Bis, MD Primary GI Physician: Dr. Abbey Chatters  Chief Complaint  Patient presents with  . Abdominal Pain    hurting in bottom of stomach and groin, gallstones, vomiting    HPI:   Eric Benitez is a 79 y.o. male with history of GERD, gastritis, constipation, heme positive stool, and colon polyps. Colonoscopy in December 2018 with 6 tubular adenomas, redundant colon, and external hemorrhoids with recommendations to repeat in December 2021.  EGD at the same time with mild to moderate gastritis.  Repeat EGD in April 2020 due to report of black stools with normal examined esophagus, gastritis due to ASA, and normal examined duodenum.  No obvious source for melena.  Recommended Givens capsule study if he develops melena again. Patient has not been seen since EGD. He presents today due to lower abdominal pain.   Reviewed recent CT A/P without contrast (care everywhere) completed 11/06/2019.  Indication was right flank pain, right groin pain.  No acute findings.  Mildly prominent prostate, aortic atherosclerosis, cholelithiasis, left lower pole nephrolithiasis, no ureteral stones or hydronephrosis bilaterally.  Today: About 2 months ago, he began with right-sided back pain radiating around to the front of his abdomen.  States he was very constipated when the pain first began and he ended up in the emergency room on 10/11 in [place].  States he was told he had gallstones and constipation.  He provided me with his discharge summary, CT, ultrasound, and lab results to review.  No physician notes were included.  He has started taking a probiotic and vegetables stool softener daily which is allowing him to have soft formed BMs daily.  Overall, abdominal pain is improving. Severity is about 3-4 out of 10.  Now he has more of a burning across the lower abdomen that is constant most of the time.  Can improve with bowel movements or  urination.  Feels he is emptying his bowel and bladder well. Burning in his lower abdomen starts after he is up moving around.  Somewhat worse when bending over.  Not affected by eating.  Denies BRBPR or melena.  No abdominal cramping.  Denies similar symptoms in the past although per review of prior notes, there is mention of lower abdominal pain along/below the belt line which is what patient is describing today as well.  No diarrhea.  No dysuria or hematuria.  No nausea or vomiting. Had vomiting on 10/11 only when symptoms were most severe. No fever. No chills. No recent heavy lifting or straining himself.   No GERD symptoms. No dysphagia.   No NSAIDs.   Dicyclomine is on patient's med list.  States he is not taking this medication and is not sure what it was used for.  No unintentional weight loss.    Reviewed information from [facility] in [place] on 11/17/19. Discharge summary states patient was seen for biliary colic and constipation.    Labs: WBC 7.9, hemoglobin 12.2 (L), platelets within normal limits, sodium 135 (L), otherwise, electrolytes, kidney function, and LFTs within normal limits.  Lipase also normal.  UA negative.  RUQ ultrasound 11/17/2019: Impression: 1.  Cholelithiasis without sonographic evidence of acute cholecystitis. 2.  Small liver cyst and small right renal cyst.  CT A/P with IV contrast only: Impression: 1.  No bowel obstruction or evidence of acute inflammatory process within the abdomen or pelvis. 2.  Cholelithiasis. 3.  There are 2 small  low attenuating lesions in the liver as well as small low-attenuation lesion within the right kidney consistent with cysts. 4.  Evidence of mild to moderate constipation. 5.  Evidence for mild fibrosis visualized lung bases.  Past Medical History:  Diagnosis Date  . Arthritis    "neck, hands, fingers" (04/09/2014)  . Bleeds easily ([place])   . CAD (coronary artery disease)    a. s/p prior stenting of  LAD b.  patent stent by cath in 2016 and 06/2017 --> residual 60% D1 stenosis and moderate disease along the RCA  . COPD (chronic obstructive pulmonary disease) ([place])   . Hyperlipemia   . Hypertension   . Kidney stones    "I've had several; had to go in and get them once" (04/09/2014)  . Pneumonia 1940's    Past Surgical History:  Procedure Laterality Date  . ANKLE FRACTURE SURGERY Right 2009  . CARDIAC CATHETERIZATION  1990's X 2;  04/09/2014  . CARDIOVERSION N/A 06/07/2018   Procedure: CARDIOVERSION;  Surgeon: Jolaine Artist, MD;  Location: [facility] ENDOSCOPY;  Service: Cardiovascular;  Laterality: N/A;  . COLONOSCOPY N/A 01/22/2017   Dr. fields: 6 tubular adenomas removed, external hemorrhoids.  Recommended next exam in 3 years.  . CORONARY ANGIOPLASTY WITH STENT PLACEMENT  2006   "2"  . CYSTOSCOPY W/ STONE MANIPULATION  ~ 2008  . ESOPHAGOGASTRODUODENOSCOPY N/A 01/22/2017   Dr. Oneida Alar: Mild to moderate gastritis.  No H. pylori  . ESOPHAGOGASTRODUODENOSCOPY N/A 05/31/2018   Procedure: ESOPHAGOGASTRODUODENOSCOPY (EGD);  Surgeon: Danie Binder, MD;  normal examined esophagus, gastritis due to ASA, and normal examined duodenum.  No obvious source for melena.   Marland Kitchen FRACTURE SURGERY    . INTRAVASCULAR PRESSURE WIRE/FFR STUDY N/A 06/08/2017   Procedure: INTRAVASCULAR PRESSURE WIRE/FFR STUDY;  Surgeon: Nelva Bush, MD;  Location: [place] CV LAB;  Service: Cardiovascular;  Laterality: N/A;  . LEFT HEART CATH AND CORONARY ANGIOGRAPHY N/A 06/08/2017   Procedure: LEFT HEART CATH AND CORONARY ANGIOGRAPHY;  Surgeon: Nelva Bush, MD;  Location: [place] CV LAB;  Service: Cardiovascular;  Laterality: N/A;  . LEFT HEART CATH AND CORONARY ANGIOGRAPHY N/A 06/05/2018   Procedure: LEFT HEART CATH AND CORONARY ANGIOGRAPHY;  Surgeon: Belva Crome, MD;  Location: [place] CV LAB;  Service: Cardiovascular;  Laterality: N/A;  . LEFT HEART CATHETERIZATION WITH CORONARY ANGIOGRAM N/A 04/09/2014    Procedure: LEFT HEART CATHETERIZATION WITH CORONARY ANGIOGRAM;  Surgeon: Leonie Man, MD;  Location: [facility] CATH LAB;  Service: Cardiovascular;  Laterality: N/A;  . NASAL SEPTUM SURGERY  1999  . POLYPECTOMY  01/22/2017   Procedure: POLYPECTOMY;  Surgeon: Danie Binder, MD;  Location: [facility] ENDO SUITE;  Service: Endoscopy;;  right colon x4  . TEE WITHOUT CARDIOVERSION N/A 06/07/2018   Procedure: TRANSESOPHAGEAL ECHOCARDIOGRAM (TEE);  Surgeon: Jolaine Artist, MD;  Location: [facility] ENDOSCOPY;  Service: Cardiovascular;  Laterality: N/A;    Current Outpatient Medications  Medication Sig Dispense Refill  . albuterol (VENTOLIN HFA) 108 (90 Base) MCG/ACT inhaler Inhale 2 puffs into the lungs every 6 (six) hours as needed for wheezing or shortness of breath.    Marland Kitchen apixaban (ELIQUIS) 5 MG TABS tablet Take 1 tablet (5 mg total) by mouth every 12 (twelve) hours. 60 tablet 11  . atorvastatin (LIPITOR) 80 MG tablet Take 80 mg by mouth at bedtime.     . diphenhydramine-acetaminophen (TYLENOL PM) 25-500 MG TABS tablet Take 2 tablets by mouth at bedtime.     Marland Kitchen doxazosin (CARDURA) 8 MG  tablet Take 8 mg by mouth at bedtime.     . Fluticasone Propionate (ALLERGY RELIEF NA) Place 2 sprays into the nose as needed (allergies).     . Hypromellose (ARTIFICIAL TEARS OP) Place 1-2 drops into both eyes daily as needed (for dry eyes).    Marland Kitchen lisinopril (PRINIVIL,ZESTRIL) 2.5 MG tablet TAKE 1 TABLET BY MOUTH EVERY DAY (Patient taking differently: Take 2.5 mg by mouth at bedtime. ) 90 tablet 1  . loratadine (CLARITIN) 10 MG tablet Take 10 mg by mouth daily.    . metoprolol succinate (TOPROL-XL) 25 MG 24 hr tablet Take 25 mg by mouth daily.     . nitroGLYCERIN (NITROSTAT) 0.4 MG SL tablet PLACE 1 TABLET (0.4 MG TOTAL) UNDER THE TONGUE EVERY 5 (FIVE) MINUTES AS NEEDED FOR CHEST PAIN. 25 tablet 3  . pantoprazole (PROTONIX) 40 MG tablet TAKE 1 TABLET BY MOUTH DAILY (Patient taking differently: Take 40 mg by mouth daily) 30 tablet 6    . Probiotic Product (PROBIOTIC PO) Take by mouth in the morning and at bedtime.    . ranolazine (RANEXA) 500 MG 12 hr tablet Take 500 mg by mouth 2 (two) times daily.     Marland Kitchen senna (SENOKOT) 8.6 MG tablet Take 3 tablets by mouth at bedtime.     . vitamin B-12 (CYANOCOBALAMIN) 1000 MCG tablet Take 1,000 mcg by mouth daily.     No current facility-administered medications for this visit.    Allergies as of 12/05/2019 - Review Complete 12/05/2019  Allergen Reaction Noted  . Vancomycin Rash 06/06/2010    Family History  Problem Relation Age of Onset  . Heart disease Mother   . Colon cancer Neg Hx   . Gastric cancer Neg Hx   . Esophageal cancer Neg Hx     Social History   Socioeconomic History  . Marital status: Married    Spouse name: Not on file  . Number of children: Not on file  . Years of education: Not on file  . Highest education level: Not on file  Occupational History    Comment: Retired  Tobacco Use  . Smoking status: Former Smoker    Packs/day: 1.50    Years: 22.00    Pack years: 33.00    Types: Cigarettes    Start date: 11/28/1942    Quit date: 02/07/1964    Years since quitting: 55.8  . Smokeless tobacco: Never Used  Vaping Use  . Vaping Use: Never used  Substance and Sexual Activity  . Alcohol use: No    Alcohol/week: 0.0 standard drinks  . Drug use: No  . Sexual activity: Yes  Other Topics Concern  . Not on file  Social History Narrative  . Not on file   Social Determinants of Health   Financial Resource Strain:   . Difficulty of Paying Living Expenses: Not on file  Food Insecurity:   . Worried About Charity fundraiser in the Last Year: Not on file  . Ran Out of Food in the Last Year: Not on file  Transportation Needs:   . Lack of Transportation (Medical): Not on file  . Lack of Transportation (Non-Medical): Not on file  Physical Activity:   . Days of Exercise per Week: Not on file  . Minutes of Exercise per Session: Not on file  Stress:   .  Feeling of Stress : Not on file  Social Connections:   . Frequency of Communication with Friends and Family: Not on file  .  Frequency of Social Gatherings with Friends and Family: Not on file  . Attends Religious Services: Not on file  . Active Member of Clubs or Organizations: Not on file  . Attends Archivist Meetings: Not on file  . Marital Status: Not on file    Review of Systems: Gen: Denies fever, chills, cold or flulike symptoms, lightheadedness, dizziness, presyncope, syncope. CV: Denies chest pain or heart palpitations. Resp: Admits to shortness of breath with exertion. No shortness of breath at rest.  No regular cough. GI: See HPI Heme: See HPI  Physical Exam: BP (!) 147/75   Pulse 66   Temp (!) 97.3 F (36.3 C) (Temporal)   Ht 5\' 8"  (1.727 m)   Wt 169 lb 9.6 oz (76.9 kg)   BMI 25.79 kg/m  General:   Alert and oriented. No distress noted. Pleasant and cooperative.  Head:  Normocephalic and atraumatic. Eyes:  Conjuctiva clear without scleral icterus. Heart:  S1, S2 present without murmurs appreciated. Lungs:  Clear to auscultation bilaterally. No wheezes, rales, or rhonchi. No distress.  Abdomen:  +BS, soft, and non-distended.  Minimal tenderness in the LLQ and RUQ. No rebound or guarding. No HSM or masses noted.  Msk:  Symmetrical without gross deformities. Normal posture. Extremities:  Without edema. Neurologic:  Alert and  oriented x4 Psych: Normal mood and affect.

## 2019-12-05 ENCOUNTER — Telehealth: Payer: Self-pay

## 2019-12-05 ENCOUNTER — Encounter: Payer: Self-pay | Admitting: Gastroenterology

## 2019-12-05 ENCOUNTER — Other Ambulatory Visit: Payer: Self-pay

## 2019-12-05 ENCOUNTER — Ambulatory Visit: Payer: PPO | Admitting: Gastroenterology

## 2019-12-05 VITALS — BP 147/75 | HR 66 | Temp 97.3°F | Ht 68.0 in | Wt 169.6 lb

## 2019-12-05 DIAGNOSIS — Z86010 Personal history of colonic polyps: Secondary | ICD-10-CM

## 2019-12-05 DIAGNOSIS — R1030 Lower abdominal pain, unspecified: Secondary | ICD-10-CM | POA: Diagnosis not present

## 2019-12-05 DIAGNOSIS — K5900 Constipation, unspecified: Secondary | ICD-10-CM

## 2019-12-05 DIAGNOSIS — R109 Unspecified abdominal pain: Secondary | ICD-10-CM | POA: Insufficient documentation

## 2019-12-05 NOTE — Telephone Encounter (Signed)
Pt. Is scheduled for colonoscopy is it ok to hold Eliquis for 48hrs.

## 2019-12-05 NOTE — Patient Instructions (Signed)
Please have urine testing completed at [facility]  We will get you scheduled for a colonoscopy in the near future with Dr. Abbey Chatters. We are reaching out to your cardiologist to get the okay to hold Eliquis for 48 hours prior to your procedure.  Continue taking probiotic and stool softener daily. Let me know if this doesn't continue to control constipation well.   Monitor for any worsening abdominal pain prior to your colonoscopy and let us know if this occurs.    We will see you back after you colonoscopy.   It was good to meet you today!  Aliene Altes, PA-C [facility]

## 2019-12-05 NOTE — Telephone Encounter (Signed)
OPENED IN ERROR

## 2019-12-05 NOTE — Telephone Encounter (Signed)
Ok to hold eliquis as needed for procedure  Zandra Abts MD

## 2019-12-06 DIAGNOSIS — I25111 Atherosclerotic heart disease of native coronary artery with angina pectoris with documented spasm: Secondary | ICD-10-CM | POA: Diagnosis not present

## 2019-12-06 DIAGNOSIS — E7849 Other hyperlipidemia: Secondary | ICD-10-CM | POA: Diagnosis not present

## 2019-12-06 DIAGNOSIS — I10 Essential (primary) hypertension: Secondary | ICD-10-CM | POA: Diagnosis not present

## 2019-12-06 LAB — URINALYSIS
Bilirubin, UA: NEGATIVE
Glucose, UA: NEGATIVE
Ketones, UA: NEGATIVE
Leukocytes,UA: NEGATIVE
Nitrite, UA: NEGATIVE
Protein,UA: NEGATIVE
RBC, UA: NEGATIVE
Specific Gravity, UA: 1.008 (ref 1.005–1.030)
Urobilinogen, Ur: 0.2 mg/dL (ref 0.2–1.0)
pH, UA: 7.5 (ref 5.0–7.5)

## 2019-12-08 LAB — URINE CULTURE: Organism ID, Bacteria: NO GROWTH

## 2019-12-08 NOTE — Telephone Encounter (Signed)
Routed to [employer]

## 2019-12-08 NOTE — Telephone Encounter (Signed)
Called pt, TCS w/Propofol w/Dr. Abbey Chatters scheduled for 01/27/20 at 8:00am. Orders entered.

## 2019-12-09 NOTE — Telephone Encounter (Signed)
Pre-op/covid test 01/23/20. Appt letter mailed with procedure instructions.

## 2019-12-11 NOTE — Assessment & Plan Note (Addendum)
79 year old male with history of GERD, gastritis, constipation, adenomatous colon polyps, and external hemorrhoids with last colonoscopy in December 2018, due for repeat in December 2021 who is presenting today for further evaluation of lower abdominal pain which began 2 months ago in the setting of significant constipation.  He was seen in the emergency room in [place] and had CT and ultrasound revealing constipation and cholelithiasis without cholecystitis. No colitis, diverticulitis, kidney stones, or bladder abnormalities. He has started a daily stool softener and probiotic and reports abdominal pain is improving with a mild burning sensation across his lower abdomen, about 3/10 in severity, that improves after a BM or urinating. Feels bowels are moving well at this time.  Denies brbpr, melena, unintentional weight loss, dysuria, or hematuria.  Abdominal exam with minimal tenderness palpation in the LLQ and RLQ.  Query whether he may have IBS-C.  Less likely UTI.  Doubt diverticulitis as symptoms were much more severe a few weeks ago at the time of his CT which was benign. Discussed other medications to manage constipation. Patient would like to continue with the stool softener as he feels this is working well. As he is due for surveillance colonoscopy, this will help evaluate for any other occult colonic etiology of his abdominal pain.   Plan:  Urinalysis and urine culture to rule out UTI. Continue daily probiotic and stool softener as he feels this is working very well.  Advised to let me know if constipation returns or if he would like to try a different medication. Proceed with surveillance colonoscopy with propofol with Dr. Abbey Chatters in the near future.  This should be his last colonoscopy. The risks, benefits, and alternatives have been discussed with the patient in detail. The patient states understanding and desires to proceed.  ASA III We will reach out to cardiology to get the okay to hold  Eliquis x48 hours prior to procedure. Advised to monitor for any worsening abdominal pain and let me know if this occurs.  Will order repeat CT. Follow-up after procedure.

## 2019-12-11 NOTE — Assessment & Plan Note (Signed)
Addressed under abdominal pain.  

## 2019-12-11 NOTE — Assessment & Plan Note (Addendum)
79 year old male with history of adenomatous colon polyps.  Last colonoscopy December 2018 with 6 tubular adenomas, redundant colon, and external hemorrhoids with recommendations to repeat in December 2021.  Notably, he was struggling with constipation and developed significant lower abdominal pain causing him to present to the emergency room on 10/11 in [place]. No acute findings on CT or ultrasound.  Overall, symptoms are improving with stool softener and probiotic.  He continues with mild burning across the lower abdomen which is discussed below.  Abdominal exam with minimal tenderness palpation in the LLQ and RLQ.  Proceed with colonoscopy with propofol with Dr. Abbey Chatters in the near future.  Suspect this will be his last colonoscopy. The risks, benefits, and alternatives have been discussed with the patient in detail. The patient states understanding and desires to proceed.  ASA III We will reach out to cardiology to get the okay to hold Eliquis for 48 hours prior to procedure. Advised that he monitor for any worsening abdominal pain prior to his colonoscopy and let me know if this occurs.  Would order repeat CT. Follow-up after procedure.

## 2019-12-16 NOTE — Progress Notes (Signed)
Cc'ed to pcp °

## 2019-12-19 ENCOUNTER — Ambulatory Visit: Payer: PPO | Admitting: Gastroenterology

## 2020-01-06 DIAGNOSIS — I482 Chronic atrial fibrillation, unspecified: Secondary | ICD-10-CM | POA: Diagnosis not present

## 2020-01-06 DIAGNOSIS — I10 Essential (primary) hypertension: Secondary | ICD-10-CM | POA: Diagnosis not present

## 2020-01-06 DIAGNOSIS — E7849 Other hyperlipidemia: Secondary | ICD-10-CM | POA: Diagnosis not present

## 2020-01-19 ENCOUNTER — Telehealth: Payer: Self-pay | Admitting: Internal Medicine

## 2020-01-19 NOTE — Telephone Encounter (Signed)
Pt wants to reschedule his procedure. He is scheduled with Dr Abbey Chatters for 01/27/2020.   [phone]

## 2020-01-19 NOTE — Telephone Encounter (Signed)
Called pt, offered to reschedule TCS to 02/10/20 at 2:30pm. He will call office back.

## 2020-01-20 ENCOUNTER — Telehealth: Payer: Self-pay | Admitting: Internal Medicine

## 2020-01-20 NOTE — Telephone Encounter (Signed)
Pt called office, unable to do TCS 02/10/20.  Spoke to pt, TCS w/Dr. Abbey Chatters rescheduled to 03/02/20 at 1:45pm. LMOVM for endo scheduler.

## 2020-01-20 NOTE — Telephone Encounter (Signed)
Pt called to say he couldn't do 02/10/2020 for his procedure. [phone]

## 2020-01-20 NOTE — Telephone Encounter (Signed)
See phone note started yesterday.

## 2020-01-20 NOTE — Telephone Encounter (Signed)
Pre-op/covid test 03/01/20 at 9:15am. Appt letter mailed with new procedure instructions.

## 2020-01-23 ENCOUNTER — Encounter (HOSPITAL_COMMUNITY): Admission: RE | Admit: 2020-01-23 | Payer: PPO | Source: Ambulatory Visit

## 2020-01-23 ENCOUNTER — Other Ambulatory Visit (HOSPITAL_COMMUNITY): Payer: PPO

## 2020-02-06 DIAGNOSIS — E7849 Other hyperlipidemia: Secondary | ICD-10-CM | POA: Diagnosis not present

## 2020-02-06 DIAGNOSIS — I482 Chronic atrial fibrillation, unspecified: Secondary | ICD-10-CM | POA: Diagnosis not present

## 2020-02-06 DIAGNOSIS — I10 Essential (primary) hypertension: Secondary | ICD-10-CM | POA: Diagnosis not present

## 2020-02-06 DIAGNOSIS — I25111 Atherosclerotic heart disease of native coronary artery with angina pectoris with documented spasm: Secondary | ICD-10-CM | POA: Diagnosis not present

## 2020-02-09 DIAGNOSIS — K2100 Gastro-esophageal reflux disease with esophagitis, without bleeding: Secondary | ICD-10-CM | POA: Diagnosis not present

## 2020-02-09 DIAGNOSIS — I10 Essential (primary) hypertension: Secondary | ICD-10-CM | POA: Diagnosis not present

## 2020-02-09 DIAGNOSIS — E782 Mixed hyperlipidemia: Secondary | ICD-10-CM | POA: Diagnosis not present

## 2020-02-09 DIAGNOSIS — D649 Anemia, unspecified: Secondary | ICD-10-CM | POA: Diagnosis not present

## 2020-02-09 DIAGNOSIS — Z1329 Encounter for screening for other suspected endocrine disorder: Secondary | ICD-10-CM | POA: Diagnosis not present

## 2020-02-09 DIAGNOSIS — E7800 Pure hypercholesterolemia, unspecified: Secondary | ICD-10-CM | POA: Diagnosis not present

## 2020-02-13 DIAGNOSIS — I10 Essential (primary) hypertension: Secondary | ICD-10-CM | POA: Diagnosis not present

## 2020-02-13 DIAGNOSIS — I482 Chronic atrial fibrillation, unspecified: Secondary | ICD-10-CM | POA: Diagnosis not present

## 2020-02-13 DIAGNOSIS — I25111 Atherosclerotic heart disease of native coronary artery with angina pectoris with documented spasm: Secondary | ICD-10-CM | POA: Diagnosis not present

## 2020-02-13 DIAGNOSIS — K2100 Gastro-esophageal reflux disease with esophagitis, without bleeding: Secondary | ICD-10-CM | POA: Diagnosis not present

## 2020-02-13 DIAGNOSIS — E782 Mixed hyperlipidemia: Secondary | ICD-10-CM | POA: Diagnosis not present

## 2020-02-13 DIAGNOSIS — D649 Anemia, unspecified: Secondary | ICD-10-CM | POA: Diagnosis not present

## 2020-02-13 DIAGNOSIS — N401 Enlarged prostate with lower urinary tract symptoms: Secondary | ICD-10-CM | POA: Diagnosis not present

## 2020-02-13 DIAGNOSIS — Z7189 Other specified counseling: Secondary | ICD-10-CM | POA: Diagnosis not present

## 2020-02-17 ENCOUNTER — Ambulatory Visit: Payer: PPO | Admitting: Cardiology

## 2020-02-23 ENCOUNTER — Telehealth: Payer: Self-pay | Admitting: Cardiology

## 2020-02-23 NOTE — Telephone Encounter (Signed)
  Patient Consent for Virtual Visit         Eric Benitez has provided verbal consent on 02/23/2020 for a virtual visit (video or telephone).   CONSENT FOR VIRTUAL VISIT FOR:  Eric Benitez  By participating in this virtual visit I agree to the following:  I hereby voluntarily request, consent and authorize [facility] and its employed or contracted physicians, physician assistants, nurse practitioners or other licensed health care professionals (the Practitioner), to provide me with telemedicine health care services (the "Services") as deemed necessary by the treating Practitioner. I acknowledge and consent to receive the Services by the Practitioner via telemedicine. I understand that the telemedicine visit will involve communicating with the Practitioner through live audiovisual communication technology and the disclosure of certain medical information by electronic transmission. I acknowledge that I have been given the opportunity to request an in-person assessment or other available alternative prior to the telemedicine visit and am voluntarily participating in the telemedicine visit.  I understand that I have the right to withhold or withdraw my consent to the use of telemedicine in the course of my care at any time, without affecting my right to future care or treatment, and that the Practitioner or I may terminate the telemedicine visit at any time. I understand that I have the right to inspect all information obtained and/or recorded in the course of the telemedicine visit and may receive copies of available information for a reasonable fee.  I understand that some of the potential risks of receiving the Services via telemedicine include:  Marland Kitchen Delay or interruption in medical evaluation due to technological equipment failure or disruption; . Information transmitted may not be sufficient (e.g. poor resolution of images) to allow for appropriate medical decision making by the Practitioner;  and/or  . In rare instances, security protocols could fail, causing a breach of personal health information.  Furthermore, I acknowledge that it is my responsibility to provide information about my medical history, conditions and care that is complete and accurate to the best of my ability. I acknowledge that Practitioner's advice, recommendations, and/or decision may be based on factors not within their control, such as incomplete or inaccurate data provided by me or distortions of diagnostic images or specimens that may result from electronic transmissions. I understand that the practice of medicine is not an exact science and that Practitioner makes no warranties or guarantees regarding treatment outcomes. I acknowledge that a copy of this consent can be made available to me via my patient portal ([facility]), or I can request a printed copy by calling the office of [facility].    I understand that my insurance will be billed for this visit.   I have read or had this consent read to me. . I understand the contents of this consent, which adequately explains the benefits and risks of the Services being provided via telemedicine.  . I have been provided ample opportunity to ask questions regarding this consent and the Services and have had my questions answered to my satisfaction. . I give my informed consent for the services to be provided through the use of telemedicine in my medical care

## 2020-02-24 ENCOUNTER — Encounter: Payer: Self-pay | Admitting: Cardiology

## 2020-02-24 ENCOUNTER — Other Ambulatory Visit: Payer: Self-pay

## 2020-02-24 ENCOUNTER — Telehealth (INDEPENDENT_AMBULATORY_CARE_PROVIDER_SITE_OTHER): Payer: PPO | Admitting: Cardiology

## 2020-02-24 ENCOUNTER — Encounter: Payer: Self-pay | Admitting: *Deleted

## 2020-02-24 VITALS — Ht 69.0 in | Wt 165.0 lb

## 2020-02-24 DIAGNOSIS — I483 Typical atrial flutter: Secondary | ICD-10-CM

## 2020-02-24 DIAGNOSIS — I2510 Atherosclerotic heart disease of native coronary artery without angina pectoris: Secondary | ICD-10-CM

## 2020-02-24 NOTE — Progress Notes (Signed)
Virtual Visit via Telephone Note   This visit type was conducted due to national recommendations for restrictions regarding the COVID-19 Pandemic (e.g. social distancing) in an effort to limit this patient's exposure and mitigate transmission in our community.  Due to his co-morbid illnesses, this patient is at least at moderate risk for complications without adequate follow up.  This format is felt to be most appropriate for this patient at this time.  The patient did not have access to video technology/had technical difficulties with video requiring transitioning to audio format only (telephone).  All issues noted in this document were discussed and addressed.  No physical exam could be performed with this format.  Please refer to the patient's chart for his  consent to telehealth for [facility].    Date:  02/24/2020   ID:  Eric Benitez, DOB 1940-05-28, MRN [id number] The patient was identified using 2 identifiers.  Patient Location: Home Provider Location: Office/Clinic  PCP:  Caryl Bis, MD  Cardiologist:  Carlyle Dolly, MD  Electrophysiologist:  None   Evaluation Performed:  Follow-Up Visit  Chief Complaint:  Follow up visit  History of Present Illness:    Eric Benitez is a 80 y.o. male seen today for follow up of the following medical problems  1. CAD - hx of prior stenting 10 years ago - cath 04/09/14 with patent coronaries.  06/2017 cath patent vessels. LVEDP 6.  - no recent chest pain. No SOB/DOE - compliant with meds - starting at [employer], riding stationary bike x 20 minutes without troubles. - 05/2018 during admission had cath - 05/2018 cath: patent vessels and stents, ostial D1 60-70% stenosis    - no recent chest pains - no SOB/DOE - compliant with meds   2. SOB - 10/2015 PFTs mild ventilatory defect -05/2017 echo LVEF 60-65%, no WMAs, diastolic function not described.  - 05/2017 lexiscan small apical ischemia, low risk -  06/2017 cath patent vessels. LVEDP 6.  -denies any recent symptoms   3. Atrial flutter - recent admission 05/2018 with new onset aflutter. Symptoms were recurring chest tightness, neck discomfort, fatigue. DId not have significant palpitations.  - s/p TEE/DCCV that admission.   - no recent palpitations - no bleeding on eliquis   4. Generalized fatigue - generalized fatigue, leg weakness. Headache back of head most at night.  - no chest pain. No recent palpitations.  - seen by pcp recently.   COVID shots x 2, booster x 1.   The patient does not have symptoms concerning for COVID-19 infection (fever, chills, cough, or new shortness of breath).    Past Medical History:  Diagnosis Date  . Arthritis    "neck, hands, fingers" (04/09/2014)  . Bleeds easily ([place])   . CAD (coronary artery disease)    a. s/p prior stenting of LAD b.  patent stent by cath in 2016 and 06/2017 --> residual 60% D1 stenosis and moderate disease along the RCA  . COPD (chronic obstructive pulmonary disease) ([place])   . Hyperlipemia   . Hypertension   . Kidney stones    "I've had several; had to go in and get them once" (04/09/2014)  . Pneumonia 1940's   Past Surgical History:  Procedure Laterality Date  . ANKLE FRACTURE SURGERY Right 2009  . CARDIAC CATHETERIZATION  1990's X 2;  04/09/2014  . CARDIOVERSION N/A 06/07/2018   Procedure: CARDIOVERSION;  Surgeon: Jolaine Artist, MD;  Location: [facility];  Service: Cardiovascular;  Laterality: N/A;  .  COLONOSCOPY N/A 01/22/2017   Dr. Oneida Alar: 6 tubular adenomas removed, external hemorrhoids.  Recommended next exam in 3 years.  . CORONARY ANGIOPLASTY WITH STENT PLACEMENT  2006   "2"  . CYSTOSCOPY W/ STONE MANIPULATION  ~ 2008  . ESOPHAGOGASTRODUODENOSCOPY N/A 01/22/2017   Dr. Oneida Alar: Mild to moderate gastritis.  No H. pylori  . ESOPHAGOGASTRODUODENOSCOPY N/A 05/31/2018   Procedure: ESOPHAGOGASTRODUODENOSCOPY (EGD);  Surgeon: Danie Binder, MD;  normal  examined esophagus, gastritis due to ASA, and normal examined duodenum.  No obvious source for melena.   Marland Kitchen FRACTURE SURGERY    . INTRAVASCULAR PRESSURE WIRE/FFR STUDY N/A 06/08/2017   Procedure: INTRAVASCULAR PRESSURE WIRE/FFR STUDY;  Surgeon: Nelva Bush, MD;  Location: [facility] CV [facility];  Service: Cardiovascular;  Laterality: N/A;  . LEFT HEART CATH AND CORONARY ANGIOGRAPHY N/A 06/08/2017   Procedure: LEFT HEART CATH AND CORONARY ANGIOGRAPHY;  Surgeon: Nelva Bush, MD;  Location: [facility];  Service: Cardiovascular;  Laterality: N/A;  . LEFT HEART CATH AND CORONARY ANGIOGRAPHY N/A 06/05/2018   Procedure: LEFT HEART CATH AND CORONARY ANGIOGRAPHY;  Surgeon: Belva Crome, MD;  Location: [facility];  Service: Cardiovascular;  Laterality: N/A;  . LEFT HEART CATHETERIZATION WITH CORONARY ANGIOGRAM N/A 04/09/2014   Procedure: LEFT HEART CATHETERIZATION WITH CORONARY ANGIOGRAM;  Surgeon: Leonie Man, MD;  Location: [facility] CATH LAB;  Service: Cardiovascular;  Laterality: N/A;  . NASAL SEPTUM SURGERY  1999  . POLYPECTOMY  01/22/2017   Procedure: POLYPECTOMY;  Surgeon: Danie Binder, MD;  [facility]: [facility];  Service: Endoscopy;;  right colon x4  . TEE WITHOUT CARDIOVERSION N/A 06/07/2018   Procedure: TRANSESOPHAGEAL ECHOCARDIOGRAM (TEE);  Surgeon: Jolaine Artist, MD;  Location: [facility] ENDOSCOPY;  Service: Cardiovascular;  Laterality: N/A;     No outpatient medications have been marked as taking for the 02/24/20 encounter (Appointment) with Arnoldo Lenis, MD.     Allergies:   Vancomycin   Social History   Tobacco Use  . Smoking status: Former Smoker    Packs/day: 1.50    Years: 22.00    Pack years: 33.00    Types: Cigarettes    Start date: 11/28/1942    Quit date: 02/07/1964    Years since quitting: 56.0  . Smokeless tobacco: Never Used  Vaping Use  . Vaping Use: Never used  Substance Use Topics  . Alcohol use: No    Alcohol/week: 0.0 standard drinks  .  Drug use: No     Family Hx: The patient's family history includes Heart disease in his mother. There is no history of Colon cancer, Gastric cancer, or Esophageal cancer.  ROS:   Please see the history of present illness.     All other systems reviewed and are negative.   Prior CV studies:   The following studies were reviewed today:  04/09/14 Cath Hemodynamics:  Central Aortic Pressure / Mean: 126/67/92 mmHg  Left Ventricular Pressure / LVEDP: 128/10/16 mmHg  Left Ventriculography:  EF:50-65 %  Wall Motion:Normal to hyperdynamic  Coronary Anatomy:  Dominance: Co-dominant  Left Main: Large-caliber vessel that is short. Bifurcates into the LAD and Circumflex. Angiographically normal. [id number] vessel with a very large proximal D1 Jenasis Straley that comes off just prior to a widely patent stent in the early mid vessel. There is a small second diagonal Lowella Kindley that arises from the stented segment and another small third diagonal Shuntia Exton more distally. The distal vessel tapers into a moderate caliber vessel that wraps around the apex  perfusing the distal third of the inferoapex.  D1:Large-caliber proximal Nikita Humble with ostial 40-50% focal stenosis. The vessel then courses almost of the ramus intermedius. The vessel is very tortuous gives rise to several mold-moderate caliber branches distally. Beyond the ostial lesion, the vessel remains angiographically normal.  Left Circumflex:Moderate large-caliber, codominant vessel. There is a small moderate caliber first obtuse marginal Rubee Vega before the vessel courses into the AV groove. In the mid AV groove there is a hairpin turn as the vessel courses distally to provide to posterior lateral branches with the first being moderate caliber and second mean small caliber.  OM1:Small moderate caliber Tyianna Menefee that does not cover large distribution. Tortuous but free of disease.   RCA: Moderate large-caliber, codominant vessel. Minimal  luminal irregularities are vessel tapers down to [place] more moderate caliber right posterior descending arteries. There is a very small posterior lateral system.  After reviewing the initial angiography, no culprit lesion was identified.   10/2015 PFTs Mild ventilatory defect  11/2015 AAA screen [place] No aneurysm  04/2014 echo Study Conclusions  - Left ventricle: The cavity size was normal. Wall thickness was normal. Systolic function was normal. The estimated ejection fraction was in the range of 60% to 65%. Wall motion was normal; there were no regional wall motion abnormalities. Left ventricular diastolic function parameters were normal. - Mitral valve: There was mild regurgitation.  05/2017 nuclear stress  No diagnostic ST segment changes to indicate ischemia.  Small, mild intensity, apical inferior defect that is reversible and consistent with a small ischemic territory. Fixed inferior defect is more consistent with soft tissue attenuation.  This is a low risk study.  Nuclear stress EF: 76%.   06/2017 cath 1. Stable appearance of the coronary arteries since 2016, with 60% ostial D1 stenosis (FFR 0.90), patent mid LAD stent, and mild to moderate diffuse RCA disease. 2. Normal to hyperdynamic left ventricular contraction with low filling pressure (LVEDP 6 mmHg).   05/2018 cath  Left dominant coronary anatomy  Left main is widely patent  LAD including the mid vessel stent is widely patent. The ostial first diagonal contains 60 to 70% stenosis, unchanged from 1 year ago.  The circumflex territory is relatively small, contains irregularities in the mid body, and is unchanged from prior.  Right coronary contains diffuse luminal irregularities before giving origin to the PDA. No focal or significant obstruction is noted.  Low normal LVEF, 50%. LVEDP is normal.  RECOMMENDATIONS:   No change in coronary anatomy.  Further management of atrial  flutter.   04/2019 monitor  14 day event monitor  Min HR 51, Max HR 128, Avg HR 68  Reported symptoms correlated with sinus rhythm and mild sinus tachycardia  No significant arrhythmias    Labs/Other Tests and Data Reviewed:    EKG:  No ECG reviewed.  Recent Labs: No results found for requested labs within last 8760 hours.   Recent Lipid Panel Lab Results  Component Value Date/Time   CHOL 123 06/05/2018 05:39 AM   TRIG 37 06/05/2018 05:39 AM   HDL 50 06/05/2018 05:39 AM   CHOLHDL 2.5 06/05/2018 05:39 AM   LDLCALC 66 06/05/2018 05:39 AM    Wt Readings from Last 3 Encounters:  12/05/19 169 lb 9.6 oz (76.9 kg)  05/13/19 166 lb 9.6 oz (75.6 kg)  04/10/19 165 lb (74.8 kg)     Risk Assessment/Calculations:      Objective:    Vital Signs:   Today's Vitals   02/24/20 0759  Weight:  165 lb (74.8 kg)  Height: 5\' 9"  (1.753 m)   Body mass index is 24.37 kg/m. Normal affect. Normal speech pattern and tone. Comfortable, no apparent distress. No audible signs of sob or wheezing.   ASSESSMENT & PLAN:    1. CAD  no recent symptoms, continue current meds   2. Aflutter - doing well without symptoms, continue current meds including anticoag.           COVID-19 Education: The signs and symptoms of COVID-19 were discussed with the patient and how to seek care for testing (follow up with PCP or arrange E-visit).  The importance of social distancing was discussed today.  Time:   Today, I have spent 15 minutes with the patient with telehealth technology discussing the above problems.     Medication Adjustments/Labs and Tests Ordered: Current medicines are reviewed at length with the patient today.  Concerns regarding medicines are outlined above.   Tests Ordered: No orders of the defined types were placed in this encounter.   Medication Changes: No orders of the defined types were placed in this encounter.   Follow Up:  In Person in 6  month(s)  Signed, Carlyle Dolly, MD  02/24/2020 7:42 AM    [place]

## 2020-02-24 NOTE — Patient Instructions (Signed)
Your physician recommends that you schedule a follow-up appointment in: 6 MONTHS WITH DR BRANCH  Your physician recommends that you continue on your current medications as directed. Please refer to the Current Medication list given to you today.  Thank you for choosing [facility]!!    

## 2020-03-01 ENCOUNTER — Other Ambulatory Visit: Payer: Self-pay

## 2020-03-01 ENCOUNTER — Encounter (HOSPITAL_COMMUNITY): Payer: Self-pay

## 2020-03-01 ENCOUNTER — Other Ambulatory Visit (HOSPITAL_COMMUNITY)
Admission: RE | Admit: 2020-03-01 | Discharge: 2020-03-01 | Disposition: A | Discharge status: Home / Self Care | Payer: PPO | Source: Ambulatory Visit | Attending: Internal Medicine | Admitting: Internal Medicine

## 2020-03-01 ENCOUNTER — Encounter (HOSPITAL_COMMUNITY)
Admission: RE | Admit: 2020-03-01 | Discharge: 2020-03-01 | Disposition: A | Discharge status: Home / Self Care | Payer: PPO | Source: Ambulatory Visit | Attending: Internal Medicine | Admitting: Internal Medicine

## 2020-03-01 DIAGNOSIS — Z01812 Encounter for preprocedural laboratory examination: Secondary | ICD-10-CM | POA: Insufficient documentation

## 2020-03-01 DIAGNOSIS — Z20822 Contact with and (suspected) exposure to covid-19: Secondary | ICD-10-CM | POA: Diagnosis not present

## 2020-03-01 HISTORY — DX: Sleep apnea, unspecified: G47.30

## 2020-03-01 LAB — SARS CORONAVIRUS 2 (TAT 6-24 HRS): SARS Coronavirus 2: NEGATIVE

## 2020-03-01 NOTE — Patient Instructions (Signed)
Eric Benitez  03/01/2020     @PREFPERIOPPHARMACY @   Your procedure is scheduled on 03/02/2020 Report to [facility] at 12:10 PM    Call this number if you have problems the morning of surgery: [phone]  [phone]   Remember:  Do not eat or drink after midnight.   Please follow the diet and prep instructions given to you by Dr.  Ave Filter office.    Take these medicines the morning of surgery with A SIP OF WATER : Metoprolol, Protonix    Do not wear jewelry, make-up or nail polish.  Do not wear lotions, powders, or perfumes, or deodorant.  Do not shave 48 hours prior to surgery.  Men may shave face and neck.  Do not bring valuables to the hospital.  [facility] is not responsible for any belongings or valuables.  Contacts, dentures or bridgework may not be worn into surgery.  Leave your suitcase in the car.  After surgery it may be brought to your room.  For patients admitted to the hospital, discharge time will be determined by your treatment team.  Patients discharged the day of surgery will not be allowed to drive home.   Name and phone number of your driver:   family Special instructions:  n/a  Please read over the following fact sheets that you were given. Care and Recovery After Surgery    Monitored Anesthesia Care Anesthesia refers to techniques, procedures, and medicines that help a person stay safe and comfortable during a medical or dental procedure. Monitored anesthesia care, or sedation, is one type of anesthesia. Your anesthesia specialist may recommend sedation if you will be having a procedure that does not require you to be unconscious. You may have this procedure for:  Cataract surgery.  A dental procedure.  A biopsy.  A colonoscopy. During the procedure, you may receive a medicine to help you relax (sedative). There are three levels of sedation:  Mild sedation. At this level, you may feel awake and relaxed. You will be able to follow  directions.  Moderate sedation. At this level, you will be sleepy. You may not remember the procedure.  Deep sedation. At this level, you will be asleep. You will not remember the procedure. The more medicine you are given, the deeper your level of sedation will be. Depending on how you respond to the procedure, the anesthesia specialist may change your level of sedation or the type of anesthesia to fit your needs. An anesthesia specialist will monitor you closely during the procedure. Tell a health care provider about:  Any allergies you have.  All medicines you are taking, including vitamins, herbs, eye drops, creams, and over-the-counter medicines.  Any problems you or family members have had with anesthetic medicines.  Any blood disorders you have.  Any surgeries you have had.  Any medical conditions you have, such as sleep apnea.  Whether you are pregnant or may be pregnant.  Whether you use cigarettes, alcohol, or drugs.  Any use of steroids, whether by mouth or as a cream. What are the risks? Generally, this is a safe procedure. However, problems may occur, including:  Getting too much medicine (oversedation).  Nausea.  Allergic reaction to medicines.  Trouble breathing. If this happens, a breathing tube may be used to help with breathing. It will be removed when you are awake and breathing on your own.  Heart trouble.  Lung trouble.  Confusion that gets better with time (emergence delirium). What happens before the  procedure? Staying hydrated Follow instructions from your health care provider about hydration, which may include:  Up to 2 hours before the procedure - you may continue to drink clear liquids, such as water, clear fruit juice, black coffee, and plain tea. Eating and drinking restrictions Follow instructions from your health care provider about eating and drinking, which may include:  8 hours before the procedure - stop eating heavy meals or foods,  such as meat, fried foods, or fatty foods.  6 hours before the procedure - stop eating light meals or foods, such as toast or cereal.  6 hours before the procedure - stop drinking milk or drinks that contain milk.  2 hours before the procedure - stop drinking clear liquids. Medicines Ask your health care provider about:  Changing or stopping your regular medicines. This is especially important if you are taking diabetes medicines or blood thinners.  Taking medicines such as aspirin and ibuprofen. These medicines can thin your blood. Do not take these medicines unless your health care provider tells you to take them.  Taking over-the-counter medicines, vitamins, herbs, and supplements. Tests and exams  You will have a physical exam.  You may have blood tests done to show: ? How well your kidneys and liver are working. ? How well your blood can clot. General instructions  Plan to have a responsible adult take you home from the hospital or clinic.  If you will be going home right after the procedure, plan to have a responsible adult care for you for the time you are told. This is important. What happens during the procedure?  Your blood pressure, heart rate, breathing, level of pain, and overall condition will be monitored.  An IV will be inserted into one of your veins.  You will be given medicines as needed to keep you comfortable during the procedure. This may mean changing the level of sedation. ? Depending on your age or the procedure, the sedative may be given:  As a pill that you will swallow or as a pill that is inserted into the rectum.  As an injection into the vein or muscle.  As a spray through the nose.  The procedure will be performed.  Your breathing, heart rate, and blood pressure will be monitored during the procedure.  When the procedure is over, the medicine will be stopped. The procedure may vary among health care providers and hospitals.   What  happens after the procedure?  Your blood pressure, heart rate, breathing rate, and blood oxygen level will be monitored until you leave the hospital or clinic.  You may feel sleepy, clumsy, or nauseous.  You may feel forgetful about what happened after the procedure.  You may vomit.  You may continue to get IV fluids.  Do not drive or operate machinery until your health care provider says that it is safe. Summary  Monitored anesthesia care is used to keep a patient comfortable during short procedures.  Tell your health care provider about any allergies or health conditions you have and about all the medicines you are taking.  Before the procedure, follow instructions about when to stop eating and drinking and about changing or stopping any medicines.  Your blood pressure, heart rate, breathing rate, and blood oxygen level will be monitored until you leave the hospital or clinic.  Plan to have a responsible adult take you home from the hospital or clinic. This information is not intended to replace advice given to you by your health  care provider. Make sure you discuss any questions you have with your health care provider. Document Revised: 10/09/2019 Document Reviewed: 12/26/2018 Elsevier Patient Education  2021 [place].    Colonoscopy, Adult A colonoscopy is a procedure to look at the entire large intestine. This procedure is done using a long, thin, flexible tube that has a camera on the end. You may have a colonoscopy:  As a part of normal colorectal screening.  If you have certain symptoms, such as: ? A low number of red blood cells in your blood (anemia). ? Diarrhea that does not go away. ? Pain in your abdomen. ? Blood in your stool. A colonoscopy can help screen for and diagnose medical problems, including:  Tumors.  Extra tissue that grows where mucus forms (polyps).  Inflammation.  Areas of bleeding. Tell your health care provider about:  Any  allergies you have.  All medicines you are taking, including vitamins, herbs, eye drops, creams, and over-the-counter medicines.  Any problems you or family members have had with anesthetic medicines.  Any blood disorders you have.  Any surgeries you have had.  Any medical conditions you have.  Any problems you have had with having bowel movements.  Whether you are pregnant or may be pregnant. What are the risks? Generally, this is a safe procedure. However, problems may occur, including:  Bleeding.  Damage to your intestine.  Allergic reactions to medicines given during the procedure.  Infection. This is rare. What happens before the procedure? Eating and drinking restrictions Follow instructions from your health care provider about eating or drinking restrictions, which may include:  A few days before the procedure: ? Follow a low-fiber diet. ? Avoid nuts, seeds, dried fruit, raw fruits, and vegetables.  1-3 days before the procedure: ? Eat only gelatin dessert or ice pops. ? Drink only clear liquids, such as water, clear juice, clear broth or bouillon, black coffee or tea, or clear soft drinks or sports drinks. ? Avoid liquids that contain red or purple dye.  The day of the procedure: ? Do not eat solid foods. You may continue to drink clear liquids until up to 2 hours before the procedure. ? Do not eat or drink anything starting 2 hours before the procedure, or within the time period that your health care provider recommends. Bowel prep If you were prescribed a bowel prep to take by mouth (orally) to clean out your colon:  Take it as told by your health care provider. Starting the day before your procedure, you will need to drink a large amount of liquid medicine. The liquid will cause you to have many bowel movements of loose stool until your stool becomes almost clear or light green.  If your skin or the opening between the buttocks (anus) gets irritated from  diarrhea, you may relieve the irritation using: ? Wipes with medicine in them, such as adult wet wipes with aloe and vitamin E. ? A product to soothe skin, such as petroleum jelly.  If you vomit while drinking the bowel prep: ? Take a break for up to 60 minutes. ? Begin the bowel prep again. ? Call your health care provider if you keep vomiting or you cannot take the bowel prep without vomiting.  To clean out your colon, you may also be given: ? Laxative medicines. These help you have a bowel movement. ? Instructions for enema use. An enema is liquid medicine injected into your rectum. Medicines Ask your health care provider about:  Changing  or stopping your regular medicines or supplements. This is especially important if you are taking iron supplements, diabetes medicines, or blood thinners.  Taking medicines such as aspirin and ibuprofen. These medicines can thin your blood. Do not take these medicines unless your health care provider tells you to take them.  Taking over-the-counter medicines, vitamins, herbs, and supplements. General instructions  Ask your health care provider what steps will be taken to help prevent infection. These may include washing skin with a germ-killing soap.  Plan to have someone take you home from the hospital or clinic. What happens during the procedure?  An IV will be inserted into one of your veins.  You may be given one or more of the following: ? A medicine to help you relax (sedative). ? A medicine to numb the area (local anesthetic). ? A medicine to make you fall asleep (general anesthetic). This is rarely needed.  You will lie on your side with your knees bent.  The tube will: ? Have oil or gel put on it (be lubricated). ? Be inserted into your anus. ? Be gently eased through all parts of your large intestine.  Air will be sent into your colon to keep it open. This may cause some pressure or cramping.  Images will be taken with the  camera and will appear on a screen.  A small tissue sample may be removed to be looked at under a microscope (biopsy). The tissue may be sent to a lab for testing if any signs of problems are found.  If small polyps are found, they may be removed and checked for cancer cells.  When the procedure is finished, the tube will be removed. The procedure may vary among health care providers and hospitals.   What happens after the procedure?  Your blood pressure, heart rate, breathing rate, and blood oxygen level will be monitored until you leave the hospital or clinic.  You may have a small amount of blood in your stool.  You may pass gas and have mild cramping or bloating in your abdomen. This is caused by the air that was used to open your colon during the exam.  Do not drive for 24 hours after the procedure.  It is up to you to get the results of your procedure. Ask your health care provider, or the department that is doing the procedure, when your results will be ready. Summary  A colonoscopy is a procedure to look at the entire large intestine.  Follow instructions from your health care provider about eating and drinking before the procedure.  If you were prescribed an oral bowel prep to clean out your colon, take it as told by your health care provider.  During the colonoscopy, a flexible tube with a camera on its end is inserted into the anus and then passed into the other parts of the large intestine. This information is not intended to replace advice given to you by your health care provider. Make sure you discuss any questions you have with your health care provider. Document Revised: 08/16/2018 Document Reviewed: 08/16/2018 Elsevier Patient Education  [place].

## 2020-03-02 ENCOUNTER — Ambulatory Visit (HOSPITAL_COMMUNITY): Payer: PPO | Admitting: Certified Registered"

## 2020-03-02 ENCOUNTER — Encounter (HOSPITAL_COMMUNITY)
Admission: RE | Disposition: A | Discharge status: Home / Self Care | Payer: Self-pay | Source: Home / Self Care | Attending: Internal Medicine

## 2020-03-02 ENCOUNTER — Encounter (HOSPITAL_COMMUNITY): Payer: Self-pay

## 2020-03-02 ENCOUNTER — Ambulatory Visit (HOSPITAL_COMMUNITY)
Admission: RE | Admit: 2020-03-02 | Discharge: 2020-03-02 | Disposition: A | Discharge status: Home / Self Care | Payer: PPO | Attending: Internal Medicine | Admitting: Internal Medicine

## 2020-03-02 ENCOUNTER — Other Ambulatory Visit: Payer: Self-pay

## 2020-03-02 DIAGNOSIS — K5900 Constipation, unspecified: Secondary | ICD-10-CM | POA: Diagnosis not present

## 2020-03-02 DIAGNOSIS — R109 Unspecified abdominal pain: Secondary | ICD-10-CM | POA: Insufficient documentation

## 2020-03-02 DIAGNOSIS — Z79899 Other long term (current) drug therapy: Secondary | ICD-10-CM | POA: Diagnosis not present

## 2020-03-02 DIAGNOSIS — K648 Other hemorrhoids: Secondary | ICD-10-CM | POA: Diagnosis not present

## 2020-03-02 DIAGNOSIS — Z1211 Encounter for screening for malignant neoplasm of colon: Secondary | ICD-10-CM | POA: Diagnosis not present

## 2020-03-02 DIAGNOSIS — K5730 Diverticulosis of large intestine without perforation or abscess without bleeding: Secondary | ICD-10-CM | POA: Insufficient documentation

## 2020-03-02 DIAGNOSIS — K635 Polyp of colon: Secondary | ICD-10-CM

## 2020-03-02 DIAGNOSIS — Z955 Presence of coronary angioplasty implant and graft: Secondary | ICD-10-CM | POA: Diagnosis not present

## 2020-03-02 DIAGNOSIS — J449 Chronic obstructive pulmonary disease, unspecified: Secondary | ICD-10-CM | POA: Diagnosis not present

## 2020-03-02 DIAGNOSIS — Z86010 Personal history of colonic polyps: Secondary | ICD-10-CM | POA: Insufficient documentation

## 2020-03-02 DIAGNOSIS — D124 Benign neoplasm of descending colon: Secondary | ICD-10-CM | POA: Diagnosis not present

## 2020-03-02 DIAGNOSIS — Z7901 Long term (current) use of anticoagulants: Secondary | ICD-10-CM | POA: Diagnosis not present

## 2020-03-02 DIAGNOSIS — Z87891 Personal history of nicotine dependence: Secondary | ICD-10-CM | POA: Insufficient documentation

## 2020-03-02 HISTORY — PX: POLYPECTOMY: SHX5525

## 2020-03-02 HISTORY — PX: COLONOSCOPY WITH PROPOFOL: SHX5780

## 2020-03-02 SURGERY — COLONOSCOPY WITH PROPOFOL
Anesthesia: General

## 2020-03-02 MED ORDER — PROPOFOL 10 MG/ML IV BOLUS
INTRAVENOUS | Status: DC | PRN
  Administered 2020-03-02: 100 mg via INTRAVENOUS
  Administered 2020-03-02: 125 ug/kg/min via INTRAVENOUS

## 2020-03-02 MED ORDER — LACTATED RINGERS IV SOLN: INTRAVENOUS | Status: DC | PRN

## 2020-03-02 MED ORDER — STERILE WATER FOR IRRIGATION IR SOLN
Status: DC | PRN
  Administered 2020-03-02: 100 mL

## 2020-03-02 MED ORDER — LACTATED RINGERS IV SOLN: INTRAVENOUS | Status: DC

## 2020-03-02 NOTE — Anesthesia Postprocedure Evaluation (Signed)
Anesthesia Post Note  Patient: Eric Benitez  Procedure(s) Performed: COLONOSCOPY WITH PROPOFOL (N/A ) POLYPECTOMY  Patient location during evaluation: PACU Anesthesia Type: General Level of consciousness: awake, oriented, awake and alert and patient cooperative Pain management: pain level controlled Vital Signs Assessment: post-procedure vital signs reviewed and stable Respiratory status: spontaneous breathing, respiratory function stable and nonlabored ventilation Cardiovascular status: blood pressure returned to baseline and stable Postop Assessment: no headache and no backache Anesthetic complications: no   No complications documented.   Last Vitals:  Vitals:   03/02/20 1314  BP: (!) 153/86  Resp: 15  Temp: 36.7 C  SpO2: 99%    Last Pain:  Vitals:   03/02/20 1402  TempSrc:   PainSc: 0-No pain                 Tacy Learn

## 2020-03-02 NOTE — Anesthesia Preprocedure Evaluation (Signed)
Anesthesia Evaluation  Patient identified by MRN, date of birth, ID band Patient awake    Reviewed: Allergy & Precautions, H&P , NPO status , Patient's Chart, lab work & pertinent test results, reviewed documented beta blocker date and time   Airway Mallampati: II  TM Distance: >3 FB Neck ROM: full    Dental no notable dental hx. (+) Teeth Intact   Pulmonary asthma , sleep apnea , pneumonia, COPD, former smoker,    Pulmonary exam normal breath sounds clear to auscultation       Cardiovascular Exercise Tolerance: Good hypertension, + angina + CAD   Rhythm:regular Rate:Normal     Neuro/Psych negative neurological ROS  negative psych ROS   GI/Hepatic Neg liver ROS, PUD, GERD  Medicated,  Endo/Other  negative endocrine ROS  Renal/GU Renal disease  negative genitourinary   Musculoskeletal   Abdominal   Peds  Hematology  (+) Blood dyscrasia, anemia ,   Anesthesia Other Findings   Reproductive/Obstetrics negative OB ROS                             Anesthesia Physical Anesthesia Plan  ASA: III  Anesthesia Plan: General   Post-op Pain Management:    Induction:   PONV Risk Score and Plan: TIVA and Propofol infusion  Airway Management Planned:   Additional Equipment:   Intra-op Plan:   Post-operative Plan:   Informed Consent: I have reviewed the patients History and Physical, chart, labs and discussed the procedure including the risks, benefits and alternatives for the proposed anesthesia with the patient or authorized representative who has indicated his/her understanding and acceptance.     Dental Advisory Given  Plan Discussed with: CRNA  Anesthesia Plan Comments:         Anesthesia Quick Evaluation

## 2020-03-02 NOTE — Discharge Instructions (Addendum)
Colonoscopy Discharge Instructions  Read the instructions outlined below and refer to this sheet in the next few weeks. These discharge instructions provide you with general information on caring for yourself after you leave the hospital. Your doctor may also give you specific instructions. While your treatment has been planned according to the most current medical practices available, unavoidable complications occasionally occur.   ACTIVITY  You may resume your regular activity, but move at a slower pace for the next 24 hours.   Take frequent rest periods for the next 24 hours.   Walking will help get rid of the air and reduce the bloated feeling in your belly (abdomen).   No driving for 24 hours (because of the medicine (anesthesia) used during the test).    Do not sign any important legal documents or operate any machinery for 24 hours (because of the anesthesia used during the test).  NUTRITION  Drink plenty of fluids.   You may resume your normal diet as instructed by your doctor.   Begin with a light meal and progress to your normal diet. Heavy or fried foods are harder to digest and may make you feel sick to your stomach (nauseated).   Avoid alcoholic beverages for 24 hours or as instructed.  MEDICATIONS  You may resume your normal medications unless your doctor tells you otherwise.  WHAT YOU CAN EXPECT TODAY  Some feelings of bloating in the abdomen.   Passage of more gas than usual.   Spotting of blood in your stool or on the toilet paper.  IF YOU HAD POLYPS REMOVED DURING THE COLONOSCOPY:  No aspirin products for 7 days or as instructed.   No alcohol for 7 days or as instructed.   Eat a soft diet for the next 24 hours.  FINDING OUT THE RESULTS OF YOUR TEST Not all test results are available during your visit. If your test results are not back during the visit, make an appointment with your caregiver to find out the results. Do not assume everything is normal if  you have not heard from your caregiver or the medical facility. It is important for you to follow up on all of your test results.  SEEK IMMEDIATE MEDICAL ATTENTION IF:  You have more than a spotting of blood in your stool.   Your belly is swollen (abdominal distention).   You are nauseated or vomiting.   You have a temperature over 101.   You have abdominal pain or discomfort that is severe or gets worse throughout the day.   Your colonoscopy revealed 3 polyp(s) which I removed successfully. Await pathology results, my office will contact you. I recommend repeating colonoscopy in  years for surveillance purposes. You also have diverticulosis and internal hemorrhoids. I would recommend increasing fiber in your diet or adding OTC Benefiber/Metamucil. Be sure to drink at least 4 to 6 glasses of water daily.   Follow-up with Cyril Mourning in 3 months.     I hope you have a great rest of your week!  Elon Alas. Abbey Chatters, D.O. Gastroenterology and Hepatology [facility]   Colon Polyps  Colon polyps are tissue growths inside the colon, which is part of the large intestine. They are one of the types of polyps that can grow in the body. A polyp may be a round bump or a mushroom-shaped growth. You could have one polyp or more than one. Most colon polyps are noncancerous (benign). However, some colon polyps can become cancerous over time. Finding and  removing the polyps early can help prevent this. What are the causes? The exact cause of colon polyps is not known. What increases the risk? The following factors may make you more likely to develop this condition:  Having a family history of colorectal cancer or colon polyps.  Being older than 80 years of age.  Being younger than 80 years of age and having a significant family history of colorectal cancer or colon polyps or a genetic condition that puts you at higher risk of getting colon polyps.  Having inflammatory  bowel disease, such as ulcerative colitis or Crohn's disease.  Having certain conditions passed from parent to child (hereditary conditions), such as: ? Familial adenomatous polyposis (FAP). ? Lynch syndrome. ? Turcot syndrome. ? Peutz-Jeghers syndrome. ? MUTYH-associated polyposis (MAP).  Being overweight.  Certain lifestyle factors. These include smoking cigarettes, drinking too much alcohol, not getting enough exercise, and eating a diet that is high in fat and red meat and low in fiber.  Having had childhood cancer that was treated with radiation of the abdomen. What are the signs or symptoms? Many times, there are no symptoms. If you have symptoms, they may include:  Blood coming from the rectum during a bowel movement.  Blood in the stool (feces). The blood may be bright red or very dark in color.  Pain in the abdomen.  A change in bowel habits, such as constipation or diarrhea. How is this diagnosed? This condition is diagnosed with a colonoscopy. This is a procedure in which a lighted, flexible scope is inserted into the opening between the buttocks (anus) and then passed into the colon to examine the area. Polyps are sometimes found when a colonoscopy is done as part of routine cancer screening tests. How is this treated? This condition is treated by removing any polyps that are found. Most polyps can be removed during a colonoscopy. Those polyps will then be tested for cancer. Additional treatment may be needed depending on the results of testing. Follow these instructions at home: Eating and drinking  Eat foods that are high in fiber, such as fruits, vegetables, and whole grains.  Eat foods that are high in calcium and vitamin D, such as milk, cheese, yogurt, eggs, liver, fish, and broccoli.  Limit foods that are high in fat, such as fried foods and desserts.  Limit the amount of red meat, precooked or cured meat, or other processed meat that you eat, such as hot  dogs, sausages, bacon, or meat loaves.  Limit sugary drinks.   Lifestyle  Maintain a healthy weight, or lose weight if recommended by your health care provider.  Exercise every day or as told by your health care provider.  Do not use any products that contain nicotine or tobacco, such as cigarettes, e-cigarettes, and chewing tobacco. If you need help quitting, ask your health care provider.  Do not drink alcohol if: ? Your health care provider tells you not to drink. ? You are pregnant, may be pregnant, or are planning to become pregnant.  If you drink alcohol: ? Limit how much you use to:  0-1 drink a day for women.  0-2 drinks a day for men. ? Know how much alcohol is in your drink. In the [place] one drink equals one 12 oz bottle of beer (355 mL), one 5 oz glass of wine (148 mL), or one 1 oz glass of hard liquor (44 mL). General instructions  Take over-the-counter and prescription medicines only as told by your health  care provider.  Keep all follow-up visits. This is important. This includes having regularly scheduled colonoscopies. Talk to your health care provider about when you need a colonoscopy. Contact a health care provider if:  You have new or worsening bleeding during a bowel movement.  You have new or increased blood in your stool.  You have a change in bowel habits.  You lose weight for no known reason. Summary  Colon polyps are tissue growths inside the colon, which is part of the large intestine. They are one type of polyp that can grow in the body.  Most colon polyps are noncancerous (benign), but some can become cancerous over time.  This condition is diagnosed with a colonoscopy.  This condition is treated by removing any polyps that are found. Most polyps can be removed during a colonoscopy. This information is not intended to replace advice given to you by your health care provider. Make sure you discuss any questions you have with your health care  provider. Document Revised: 05/14/2019 Document Reviewed: 05/14/2019 Elsevier Patient Education  2021 [place].  Diverticulosis  Diverticulosis is a condition that develops when small pouches (diverticula) form in the wall of the large intestine (colon). The colon is where water is absorbed and stool (feces) is formed. The pouches form when the inside layer of the colon pushes through weak spots in the outer layers of the colon. You may have a few pouches or many of them. The pouches usually do not cause problems unless they become inflamed or infected. When this happens, the condition is called diverticulitis. What are the causes? The cause of this condition is not known. What increases the risk? The following factors may make you more likely to develop this condition:  Being older than age 27. Your risk for this condition increases with age. Diverticulosis is rare among people younger than age 36. By age 63, many people have it.  Eating a low-fiber diet.  Having frequent constipation.  Being overweight.  Not getting enough exercise.  Smoking.  Taking over-the-counter pain medicines, like aspirin and ibuprofen.  Having a family history of diverticulosis. What are the signs or symptoms? In most people, there are no symptoms of this condition. If you do have symptoms, they may include:  Bloating.  Cramps in the abdomen.  Constipation or diarrhea.  Pain in the lower left side of the abdomen. How is this diagnosed? Because diverticulosis usually has no symptoms, it is most often diagnosed during an exam for other colon problems. The condition may be diagnosed by:  Using a flexible scope to examine the colon (colonoscopy).  Taking an X-ray of the colon after dye has been put into the colon (barium enema).  Having a CT scan. How is this treated? You may not need treatment for this condition. Your health care provider may recommend treatment to prevent problems. You may  need treatment if you have symptoms or if you previously had diverticulitis. Treatment may include:  Eating a high-fiber diet.  Taking a fiber supplement.  Taking a live bacteria supplement (probiotic).  Taking medicine to relax your colon.   Follow these instructions at home: Medicines  Take over-the-counter and prescription medicines only as told by your health care provider.  If told by your health care provider, take a fiber supplement or probiotic. Constipation prevention Your condition may cause constipation. To prevent or treat constipation, you may need to:  Drink enough fluid to keep your urine pale yellow.  Take over-the-counter or prescription  medicines.  Eat foods that are high in fiber, such as beans, whole grains, and fresh fruits and vegetables.  Limit foods that are high in fat and processed sugars, such as fried or sweet foods.   General instructions  Try not to strain when you have a bowel movement.  Keep all follow-up visits as told by your health care provider. This is important. Contact a health care provider if you:  Have pain in your abdomen.  Have bloating.  Have cramps.  Have not had a bowel movement in 3 days. Get help right away if:  Your pain gets worse.  Your bloating becomes very bad.  You have a fever or chills, and your symptoms suddenly get worse.  You vomit.  You have bowel movements that are bloody or black.  You have bleeding from your rectum. Summary  Diverticulosis is a condition that develops when small pouches (diverticula) form in the wall of the large intestine (colon).  You may have a few pouches or many of them.  This condition is most often diagnosed during an exam for other colon problems.  Treatment may include increasing the fiber in your diet, taking supplements, or taking medicines. This information is not intended to replace advice given to you by your health care provider. Make sure you discuss any  questions you have with your health care provider. Document Revised: 08/22/2018 Document Reviewed: 08/22/2018 Elsevier Patient Education  2021 [place].  Hemorrhoids Hemorrhoids are swollen veins that may develop:  In the butt (rectum). These are called internal hemorrhoids.  Around the opening of the butt (anus). These are called external hemorrhoids. Hemorrhoids can cause pain, itching, or bleeding. Most of the time, they do not cause serious problems. They usually get better with diet changes, lifestyle changes, and other home treatments. What are the causes? This condition may be caused by:  Having trouble pooping (constipation).  Pushing hard (straining) to poop.  Watery poop (diarrhea).  Pregnancy.  Being very overweight (obese).  Sitting for long periods of time.  Heavy lifting or other activity that causes you to strain.  Anal sex.  Riding a bike for a long period of time. What are the signs or symptoms? Symptoms of this condition include:  Pain.  Itching or soreness in the butt.  Bleeding from the butt.  Leaking poop.  Swelling in the area.  One or more lumps around the opening of your butt. How is this diagnosed? A doctor can often diagnose this condition by looking at the affected area. The doctor may also:  Do an exam that involves feeling the area with a gloved hand (digital rectal exam).  Examine the area inside your butt using a small tube (anoscope).  Order blood tests. This may be done if you have lost a lot of blood.  Have you get a test that involves looking inside the colon using a flexible tube with a camera on the end (sigmoidoscopy or colonoscopy). How is this treated? This condition can usually be treated at home. Your doctor may tell you to change what you eat, make lifestyle changes, or try home treatments. If these do not help, procedures can be done to remove the hemorrhoids or make them smaller. These may involve:  Placing  rubber bands at the base of the hemorrhoids to cut off their blood supply.  Injecting medicine into the hemorrhoids to shrink them.  Shining a type of light energy onto the hemorrhoids to cause them to fall off.  Doing  surgery to remove the hemorrhoids or cut off their blood supply. Follow these instructions at home: Eating and drinking  Eat foods that have a lot of fiber in them. These include whole grains, beans, nuts, fruits, and vegetables.  Ask your doctor about taking products that have added fiber (fibersupplements).  Reduce the amount of fat in your diet. You can do this by: ? Eating low-fat dairy products. ? Eating less red meat. ? Avoiding processed foods.  Drink enough fluid to keep your pee (urine) pale yellow.   Managing pain and swelling  Take a warm-water bath (sitz bath) for 20 minutes to ease pain. Do this 3-4 times a day. You may do this in a bathtub or using a portable sitz bath that fits over the toilet.  If told, put ice on the painful area. It may be helpful to use ice between your warm baths. ? Put ice in a plastic bag. ? Place a towel between your skin and the bag. ? Leave the ice on for 20 minutes, 2-3 times a day.   General instructions  Take over-the-counter and prescription medicines only as told by your doctor. ? Medicated creams and medicines may be used as told.  Exercise often. Ask your doctor how much and what kind of exercise is best for you.  Go to the bathroom when you have the urge to poop. Do not wait.  Avoid pushing too hard when you poop.  Keep your butt dry and clean. Use wet toilet paper or moist towelettes after pooping.  Do not sit on the toilet for a long time.  Keep all follow-up visits as told by your doctor. This is important. Contact a doctor if you:  Have pain and swelling that do not get better with treatment or medicine.  Have trouble pooping.  Cannot poop.  Have pain or swelling outside the area of the  hemorrhoids. Get help right away if you have:  Bleeding that will not stop. Summary  Hemorrhoids are swollen veins in the butt or around the opening of the butt.  They can cause pain, itching, or bleeding.  Eat foods that have a lot of fiber in them. These include whole grains, beans, nuts, fruits, and vegetables.  Take a warm-water bath (sitz bath) for 20 minutes to ease pain. Do this 3-4 times a day. This information is not intended to replace advice given to you by your health care provider. Make sure you discuss any questions you have with your health care provider. Document Revised: 01/31/2018 Document Reviewed: 06/14/2017 Elsevier Patient Education  2021 [place].  High-Fiber Eating Plan Fiber, also called dietary fiber, is a type of carbohydrate. It is found foods such as fruits, vegetables, whole grains, and beans. A high-fiber diet can have many health benefits. Your health care provider may recommend a high-fiber diet to help:  Prevent constipation. Fiber can make your bowel movements more regular.  Lower your cholesterol.  Relieve the following conditions: ? Inflammation of veins in the anus (hemorrhoids). ? Inflammation of specific areas of the digestive tract (uncomplicated diverticulosis). ? A problem of the large intestine, also called the colon, that sometimes causes pain and diarrhea (irritable bowel syndrome, or IBS).  Prevent overeating as part of a weight-loss plan.  Prevent heart disease, type 2 diabetes, and certain cancers. What are tips for following this plan? Reading food labels  Check the nutrition facts label on food products for the amount of dietary fiber. Choose foods that have 5 grams  of fiber or more per serving.  The goals for recommended daily fiber intake include: ? Men (age 21 or younger): 34-38 g. ? Men (over age 59): 28-34 g. ? Women (age 52 or younger): 25-28 g. ? Women (over age 21): 22-25 g. Your daily fiber goal is  _____________ g.   Shopping  Choose whole fruits and vegetables instead of processed forms, such as apple juice or applesauce.  Choose a wide variety of high-fiber foods such as avocados, lentils, oats, and kidney beans.  Read the nutrition facts label of the foods you choose. Be aware of foods with added fiber. These foods often have high sugar and sodium amounts per serving. Cooking  Use whole-grain flour for baking and cooking.  Cook with brown rice instead of white rice. Meal planning  Start the day with a breakfast that is high in fiber, such as a cereal that contains 5 g of fiber or more per serving.  Eat breads and cereals that are made with whole-grain flour instead of refined flour or white flour.  Eat brown rice, bulgur wheat, or millet instead of white rice.  Use beans in place of meat in soups, salads, and pasta dishes.  Be sure that half of the grains you eat each day are whole grains. General information  You can get the recommended daily intake of dietary fiber by: ? Eating a variety of fruits, vegetables, grains, nuts, and beans. ? Taking a fiber supplement if you are not able to take in enough fiber in your diet. It is better to get fiber through food than from a supplement.  Gradually increase how much fiber you consume. If you increase your intake of dietary fiber too quickly, you may have bloating, cramping, or gas.  Drink plenty of water to help you digest fiber.  Choose high-fiber snacks, such as berries, raw vegetables, nuts, and popcorn. What foods should I eat? Fruits Berries. Pears. Apples. Oranges. Avocado. Prunes and raisins. Dried figs. Vegetables Sweet potatoes. Spinach. Kale. Artichokes. Cabbage. Broccoli. Cauliflower. Green peas. Carrots. Squash. Grains Whole-grain breads. Multigrain cereal. Oats and oatmeal. Brown rice. Barley. Bulgur wheat. Latta. Quinoa. Bran muffins. Popcorn. Rye wafer crackers. Meats and other proteins Navy beans,  kidney beans, and pinto beans. Soybeans. Split peas. Lentils. Nuts and seeds. Dairy Fiber-fortified yogurt. Beverages Fiber-fortified soy milk. Fiber-fortified orange juice. Other foods Fiber bars. The items listed above may not be a complete list of recommended foods and beverages. Contact a dietitian for more information. What foods should I avoid? Fruits Fruit juice. Cooked, strained fruit. Vegetables Fried potatoes. Canned vegetables. Well-cooked vegetables. Grains White bread. Pasta made with refined flour. White rice. Meats and other proteins Fatty cuts of meat. Fried chicken or fried fish. Dairy Milk. Yogurt. Cream cheese. Sour cream. Fats and oils Butters. Beverages Soft drinks. Other foods Cakes and pastries. The items listed above may not be a complete list of foods and beverages to avoid. Talk with your dietitian about what choices are best for you. Summary  Fiber is a type of carbohydrate. It is found in foods such as fruits, vegetables, whole grains, and beans.  A high-fiber diet has many benefits. It can help to prevent constipation, lower blood cholesterol, aid weight loss, and reduce your risk of heart disease, diabetes, and certain cancers.  Increase your intake of fiber gradually. Increasing fiber too quickly may cause cramping, bloating, and gas. Drink plenty of water while you increase the amount of fiber you consume.  The best sources of  fiber include whole fruits and vegetables, whole grains, nuts, seeds, and beans. This information is not intended to replace advice given to you by your health care provider. Make sure you discuss any questions you have with your health care provider. Document Revised: 05/29/2019 Document Reviewed: 05/29/2019 Elsevier Patient Education  2021 [employer].

## 2020-03-02 NOTE — H&P (Signed)
Primary Care Physician:  Richardean Chimera, MD Primary Gastroenterologist:  Dr. Marletta Lor  Pre-Procedure History & Physical: HPI:  Eric Benitez is a 80 y.o. male is here for a colonoscopy to be performed for surveillance due to personal history of colon polyps.   Patient denies any family history of colorectal cancer.  No melena or hematochezia.  Does note abdominal pain and constipation as well.  Past Medical History:  Diagnosis Date  . Arthritis    "neck, hands, fingers" (04/09/2014)  . Bleeds easily (HCC)   . CAD (coronary artery disease)    a. s/p prior stenting of LAD b.  patent stent by cath in 2016 and 06/2017 --> residual 60% D1 stenosis and moderate disease along the RCA  . COPD (chronic obstructive pulmonary disease) (HCC)   . Hyperlipemia   . Hypertension   . Kidney stones    "I've had several; had to go in and get them once" (04/09/2014)  . Pneumonia 1940's  . Sleep apnea     Past Surgical History:  Procedure Laterality Date  . ANKLE FRACTURE SURGERY Right 2009  . CARDIAC CATHETERIZATION  1990's X 2;  04/09/2014  . CARDIOVERSION N/A 06/07/2018   Procedure: CARDIOVERSION;  Surgeon: Dolores Patty, MD;  Location: [facility] ENDOSCOPY;  Service: Cardiovascular;  Laterality: N/A;  . COLONOSCOPY N/A 01/22/2017   Dr. fields: 6 tubular adenomas removed, external hemorrhoids.  Recommended next exam in 3 years.  . CORONARY ANGIOPLASTY WITH STENT PLACEMENT  2006   "2"  . CYSTOSCOPY W/ STONE MANIPULATION  ~ 2008  . ESOPHAGOGASTRODUODENOSCOPY N/A 01/22/2017   Dr. Darrick Penna: Mild to moderate gastritis.  No H. pylori  . ESOPHAGOGASTRODUODENOSCOPY N/A 05/31/2018   Procedure: ESOPHAGOGASTRODUODENOSCOPY (EGD);  Surgeon: West Bali, MD;  normal examined esophagus, gastritis due to ASA, and normal examined duodenum.  No obvious source for melena.   Marland Kitchen FRACTURE SURGERY    . INTRAVASCULAR PRESSURE WIRE/FFR STUDY N/A 06/08/2017   Procedure: INTRAVASCULAR PRESSURE WIRE/FFR STUDY;  Surgeon: Yvonne Kendall, MD;  Location: [facility] INVASIVE CV LAB;  Service: Cardiovascular;  Laterality: N/A;  . LEFT HEART CATH AND CORONARY ANGIOGRAPHY N/A 06/08/2017   Procedure: LEFT HEART CATH AND CORONARY ANGIOGRAPHY;  Surgeon: Yvonne Kendall, MD;  Location: [facility] INVASIVE CV LAB;  Service: Cardiovascular;  Laterality: N/A;  . LEFT HEART CATH AND CORONARY ANGIOGRAPHY N/A 06/05/2018   Procedure: LEFT HEART CATH AND CORONARY ANGIOGRAPHY;  Surgeon: Lyn Records, MD;  Location: [facility] INVASIVE CV LAB;  Service: Cardiovascular;  Laterality: N/A;  . LEFT HEART CATHETERIZATION WITH CORONARY ANGIOGRAM N/A 04/09/2014   Procedure: LEFT HEART CATHETERIZATION WITH CORONARY ANGIOGRAM;  Surgeon: Marykay Lex, MD;  Location: [facility] CATH LAB;  Service: Cardiovascular;  Laterality: N/A;  . NASAL SEPTUM SURGERY  1999  . POLYPECTOMY  01/22/2017   Procedure: POLYPECTOMY;  Surgeon: West Bali, MD;  Location: [facility] ENDO SUITE;  Service: Endoscopy;;  right colon x4  . TEE WITHOUT CARDIOVERSION N/A 06/07/2018   Procedure: TRANSESOPHAGEAL ECHOCARDIOGRAM (TEE);  Surgeon: Dolores Patty, MD;  Location: [facility] ENDOSCOPY;  Service: Cardiovascular;  Laterality: N/A;    Prior to Admission medications   Medication Sig Start Date End Date Taking? Authorizing Provider  apixaban (ELIQUIS) 5 MG TABS tablet Take 1 tablet (5 mg total) by mouth every 12 (twelve) hours. 06/07/18  Yes Bhagat, Bhavinkumar, PA  atorvastatin (LIPITOR) 80 MG tablet Take 80 mg by mouth at bedtime.    Yes [provider]  diphenhydramine-acetaminophen (TYLENOL PM) 25-500 MG  TABS tablet Take 2 tablets by mouth at bedtime.    Yes [provider]  doxazosin (CARDURA) 8 MG tablet Take 8 mg by mouth at bedtime.    Yes [provider]  fluticasone (FLONASE) 50 MCG/ACT nasal spray Place 2 sprays into the nose daily as needed (allergies).   Yes [provider]  Hypromellose (ARTIFICIAL TEARS OP) Place 1-2 drops into both eyes daily as needed (for dry  eyes).   Yes [provider]  lisinopril (PRINIVIL,ZESTRIL) 2.5 MG tablet TAKE 1 TABLET BY MOUTH EVERY DAY Patient taking differently: Take 2.5 mg by mouth at bedtime. 10/31/17  Yes BranchAlphonse Guild, MD  loratadine (CLARITIN) 10 MG tablet Take 10 mg by mouth daily.   Yes [provider]  metoprolol succinate (TOPROL-XL) 25 MG 24 hr tablet Take 25 mg by mouth daily.    Yes [provider]  nitroGLYCERIN (NITROSTAT) 0.4 MG SL tablet PLACE 1 TABLET (0.4 MG TOTAL) UNDER THE TONGUE EVERY 5 (FIVE) MINUTES AS NEEDED FOR CHEST PAIN. 06/29/17  Yes Branch, Alphonse Guild, MD  pantoprazole (PROTONIX) 40 MG tablet TAKE 1 TABLET BY MOUTH DAILY 11/10/15  Yes Branch, Alphonse Guild, MD  Probiotic Product (PROBIOTIC PO) Take 1 capsule by mouth in the morning and at bedtime.   Yes [provider]  ranolazine (RANEXA) 500 MG 12 hr tablet Take 500 mg by mouth 2 (two) times daily.  12/31/17  Yes [provider]  senna (SENOKOT) 8.6 MG tablet Take 2 tablets by mouth at bedtime.   Yes [provider]  vitamin B-12 (CYANOCOBALAMIN) 1000 MCG tablet Take 1,000 mcg by mouth daily.   Yes [provider]  albuterol (VENTOLIN HFA) 108 (90 Base) MCG/ACT inhaler Inhale 2 puffs into the lungs every 6 (six) hours as needed for wheezing or shortness of breath.    [provider]    Allergies as of 12/08/2019 - Review Complete 12/05/2019  Allergen Reaction Noted  . Vancomycin Rash 06/06/2010    Family History  Problem Relation Age of Onset  . Heart disease Mother   . Colon cancer Neg Hx   . Gastric cancer Neg Hx   . Esophageal cancer Neg Hx     Social History   Socioeconomic History  . Marital status: Married    Spouse name: Not on file  . Number of children: Not on file  . Years of education: Not on file  . Highest education level: Not on file  Occupational History    Comment: Retired  Tobacco Use  . Smoking status: Former Smoker    Packs/day: 1.50     Years: 22.00    Pack years: 33.00    Types: Cigarettes    Start date: 11/28/1942    Quit date: 02/07/1964    Years since quitting: 56.1  . Smokeless tobacco: Never Used  Vaping Use  . Vaping Use: Never used  Substance and Sexual Activity  . Alcohol use: No    Alcohol/week: 0.0 standard drinks  . Drug use: No  . Sexual activity: Yes  Other Topics Concern  . Not on file  Social History Narrative  . Not on file   Social Determinants of Health   Financial Resource Strain: Not on file  Food Insecurity: Not on file  Transportation Needs: Not on file  Physical Activity: Not on file  Stress: Not on file  Social Connections: Not on file  Intimate Partner Violence: Not on file    Review of Systems: See  HPI, otherwise negative ROS  Impression/Plan: Eric Benitez is here for a colonoscopy to be performed for surveillance due to personal history of colon polyps.   The risks of the procedure including infection, bleed, or perforation as well as benefits, limitations, alternatives and imponderables have been reviewed with the patient. Questions have been answered. All parties agreeable.

## 2020-03-02 NOTE — Transfer of Care (Signed)
Immediate Anesthesia Transfer of Care Note  Patient: Eric Benitez  Procedure(s) Performed: COLONOSCOPY WITH PROPOFOL (N/A ) POLYPECTOMY  Patient Location: PACU  Anesthesia Type:General  Level of Consciousness: awake, alert , oriented and patient cooperative  Airway & Oxygen Therapy: Patient Spontanous Breathing  Post-op Assessment: Report given to RN, Post -op Vital signs reviewed and stable and Patient moving all extremities  Post vital signs: Reviewed and stable  Last Vitals:  Vitals Value Taken Time  BP    Temp    Pulse    Resp    SpO2      Last Pain:  Vitals:   03/02/20 1402  TempSrc:   PainSc: 0-No pain      Patients Stated Pain Goal: 5 (90/30/09 2330)  Complications: No complications documented.

## 2020-03-02 NOTE — Op Note (Signed)
[facility] Patient Name: Eric Benitez Procedure Date: 03/02/2020 1:50 PM MRN: [id number] Date of Birth: [birth year] Attending MD: Elon Alas. Abbey Chatters DO CSN: [id number] Age: 80 Admit Type: Outpatient Procedure:                Colonoscopy Indications:              High risk colon cancer surveillance: Personal                            history of colonic polyps Providers:                Elon Alas. Abbey Chatters, DO, Lambert Mody, Dereck Leep, Technician Referring MD:              Medicines:                See the Anesthesia note for documentation of the                            administered medications Complications:            No immediate complications. Estimated Blood Loss:     Estimated blood loss was minimal. Procedure:                Pre-Anesthesia Assessment:                           - The anesthesia plan was to use monitored                            anesthesia care (MAC).                           After obtaining informed consent, the colonoscope                            was passed under direct vision. Throughout the                            procedure, the patient's blood pressure, pulse, and                            oxygen saturations were monitored continuously. The                            [id number]-H190DL ([id number]) scope was introduced through                            the anus and advanced to the the cecum, identified                            by appendiceal orifice and ileocecal valve. The                            colonoscopy was performed without difficulty. The  patient tolerated the procedure well. The quality                            of the bowel preparation was evaluated using the                            BBPS [facility] Bowel Preparation Scale) with scores                            of: Right Colon = 2 (minor amount of residual                            staining, small fragments of  stool and/or opaque                            liquid, but mucosa seen well), Transverse Colon = 2                            (minor amount of residual staining, small fragments                            of stool and/or opaque liquid, but mucosa seen                            well) and Left Colon = 2 (minor amount of residual                            staining, small fragments of stool and/or opaque                            liquid, but mucosa seen well). The total BBPS score                            equals 6. The quality of the bowel preparation was                            fair. Scope In: 2:03:31 PM Scope Out: 2:28:22 PM Scope Withdrawal Time: 0 hours 14 minutes 44 seconds  Total Procedure Duration: 0 hours 24 minutes 51 seconds  Findings:      The perianal and digital rectal examinations were normal.      Non-bleeding internal hemorrhoids were found during endoscopy.      Multiple small-mouthed diverticula were found in the sigmoid colon,       descending colon and transverse colon.      Two sessile polyps were found in the descending colon. The polyps were 4       to 6 mm in size. These polyps were removed with a cold snare. Resection       and retrieval were complete.      A 2 mm polyp was found in the descending colon. The polyp was sessile.       The polyp was removed with a cold biopsy forceps. Resection and       retrieval were complete. Impression:               -  Preparation of the colon was fair.                           - Non-bleeding internal hemorrhoids.                           - Diverticulosis in the sigmoid colon, in the                            descending colon and in the transverse colon.                           - Two 4 to 6 mm polyps in the descending colon,                            removed with a cold snare. Resected and retrieved.                           - One 2 mm polyp in the descending colon, removed                            with a cold  biopsy forceps. Resected and retrieved. Moderate Sedation:      Per Anesthesia Care Recommendation:           - Patient has a contact number available for                            emergencies. The signs and symptoms of potential                            delayed complications were discussed with the                            patient. Return to normal activities tomorrow.                            Written discharge instructions were provided to the                            patient.                           - Resume previous diet.                           - Continue present medications.                           - Await pathology results.                           - Repeat colonoscopy in 5 years for surveillance.                           - Return to GI clinic PRN. Procedure Code(s):        ---  Professional ---                           [phone], Colonoscopy, flexible; with removal of                            tumor(s), polyp(s), or other lesion(s) by snare                            technique                           45380, 59, Colonoscopy, flexible; with biopsy,                            single or multiple Diagnosis Code(s):        --- Professional ---                           K63.5, Polyp of colon                           Z86.010, Personal history of colonic polyps                           K64.8, Other hemorrhoids                           K57.30, Diverticulosis of large intestine without                            perforation or abscess without bleeding CPT copyright 2019 American Medical Association. All rights reserved. The codes documented in this report are preliminary and upon coder review may  be revised to meet current compliance requirements. Elon Alas. Abbey Chatters, DO [place] Abbey Chatters, DO 03/02/2020 2:32:19 PM This report has been signed electronically. Number of Addenda: 0

## 2020-03-04 LAB — SURGICAL PATHOLOGY

## 2020-03-05 ENCOUNTER — Encounter (HOSPITAL_COMMUNITY): Payer: Self-pay | Admitting: Internal Medicine

## 2020-03-06 DIAGNOSIS — E7849 Other hyperlipidemia: Secondary | ICD-10-CM | POA: Diagnosis not present

## 2020-03-06 DIAGNOSIS — I25111 Atherosclerotic heart disease of native coronary artery with angina pectoris with documented spasm: Secondary | ICD-10-CM | POA: Diagnosis not present

## 2020-03-06 DIAGNOSIS — I10 Essential (primary) hypertension: Secondary | ICD-10-CM | POA: Diagnosis not present

## 2020-03-06 DIAGNOSIS — I482 Chronic atrial fibrillation, unspecified: Secondary | ICD-10-CM | POA: Diagnosis not present

## 2020-04-05 DIAGNOSIS — E7849 Other hyperlipidemia: Secondary | ICD-10-CM | POA: Diagnosis not present

## 2020-04-05 DIAGNOSIS — I482 Chronic atrial fibrillation, unspecified: Secondary | ICD-10-CM | POA: Diagnosis not present

## 2020-04-05 DIAGNOSIS — I10 Essential (primary) hypertension: Secondary | ICD-10-CM | POA: Diagnosis not present

## 2020-04-05 DIAGNOSIS — I25111 Atherosclerotic heart disease of native coronary artery with angina pectoris with documented spasm: Secondary | ICD-10-CM | POA: Diagnosis not present

## 2020-05-03 DIAGNOSIS — L570 Actinic keratosis: Secondary | ICD-10-CM | POA: Diagnosis not present

## 2020-05-03 DIAGNOSIS — X32XXXD Exposure to sunlight, subsequent encounter: Secondary | ICD-10-CM | POA: Diagnosis not present

## 2020-05-05 DIAGNOSIS — I10 Essential (primary) hypertension: Secondary | ICD-10-CM | POA: Diagnosis not present

## 2020-05-05 DIAGNOSIS — I25111 Atherosclerotic heart disease of native coronary artery with angina pectoris with documented spasm: Secondary | ICD-10-CM | POA: Diagnosis not present

## 2020-05-05 DIAGNOSIS — E7849 Other hyperlipidemia: Secondary | ICD-10-CM | POA: Diagnosis not present

## 2020-05-05 DIAGNOSIS — I482 Chronic atrial fibrillation, unspecified: Secondary | ICD-10-CM | POA: Diagnosis not present

## 2020-05-06 DIAGNOSIS — J0101 Acute recurrent maxillary sinusitis: Secondary | ICD-10-CM | POA: Diagnosis not present

## 2020-05-12 NOTE — Progress Notes (Signed)
Referring Provider: Caryl Bis, MD Primary Care Physician:  Caryl Bis, MD Primary GI Physician: Dr. Abbey Chatters  Chief Complaint  Patient presents with  . Abdominal Pain    Doing ok. No constipation    HPI:   Eric Benitez is a 80 y.o. male presenting today for follow-up of lower abdominal pain and constipation s/p colonoscopy.  Also with history of GERD and gastritis.  Last EGD in April 2020 due to report of black stools with normal examined esophagus, gastritis due to ASA, and normal examined duodenum.  Last seen in our office 12/05/19.  He reported 2 months of right-sided back pain radiating to the front of his abdomen which first began when he was very constipated.  He had started a probiotic and vegetable stool softener daily which was controlling his constipation well.  Abdominal pain was improving with severity about 3-4 out of 10.  Now more of a burning across lower abdomen.  Some improvement with bowel movements or urination.  Notes burning starts after he is up moving around and somewhat worse when bending over.  Not affected by eating.  No BRBPR or melena.  Denies heavy lifting or straining himself.  No nausea or vomiting, fever or chills.  He had recently had an ultrasound and CT without contrast with no acute findings.  He did have cholelithiasis without cholecystitis and liver and renal cyst.  Also with evidence of mild to moderate constipation on CT.  On exam, he had minimal TTP in the LLQ and RLQ.  Discussed other medication options for constipation, but patient preferred to continue stool softener.  Plan to check urinalysis and urine culture, continue probiotic and stool softeners, proceed with colonoscopy.  UA and urine culture were negative.  Colonoscopy 03/02/2020: Nonbleeding internal hemorrhoids, multiple diverticula in the sigmoid, descending, transverse colon, 2 polyps 4 to 6 mm in size and one 2 mm polyp.  Pathology with tubular adenoma and hyperplastic polyp.  Recommended repeat in 5 years.  Today: BMs every morning with stool softener and probiotic. No abdominal pain. No blood in the stool or black stools. Weight is stable. Has a good appetite. GERD well controlled on Protonix 40 mg daily. Denies dysphagia, nausea, or vomiting.   No NSAIDs.   Past Medical History:  Diagnosis Date  . Arthritis    "neck, hands, fingers" (04/09/2014)  . Bleeds easily ([place])   . CAD (coronary artery disease)    a. s/p prior stenting of LAD b.  patent stent by cath in 2016 and 06/2017 --> residual 60% D1 stenosis and moderate disease along the RCA  . COPD (chronic obstructive pulmonary disease) ([place])   . GERD (gastroesophageal reflux disease)   . Hyperlipemia   . Hypertension   . Kidney stones    "I've had several; had to go in and get them once" (04/09/2014)  . Pneumonia 1940's  . Sleep apnea     Past Surgical History:  Procedure Laterality Date  . ANKLE FRACTURE SURGERY Right 2009  . CARDIAC CATHETERIZATION  1990's X 2;  04/09/2014  . CARDIOVERSION N/A 06/07/2018   Procedure: CARDIOVERSION;  Surgeon: Jolaine Artist, MD;  Location: [facility] ENDOSCOPY;  Service: Cardiovascular;  Laterality: N/A;  . COLONOSCOPY N/A 01/22/2017   Dr. fields: 6 tubular adenomas removed, external hemorrhoids.  Recommended next exam in 3 years.  . COLONOSCOPY WITH PROPOFOL N/A 03/02/2020   Surgeon: Hurshel Keys K, DO; Nonbleeding internal hemorrhoids, multiple diverticula in the sigmoid, descending, transverse colon, 2  polyps 4 to 6 mm in size and one 2 mm polyp.  Pathology with tubular adenoma and hyperplastic polyp. Recommended repeat in 5 years.  . CORONARY ANGIOPLASTY WITH STENT PLACEMENT  2006   "2"  . CYSTOSCOPY W/ STONE MANIPULATION  ~ 2008  . ESOPHAGOGASTRODUODENOSCOPY N/A 01/22/2017   Dr. Oneida Alar: Mild to moderate gastritis.  No H. pylori  . ESOPHAGOGASTRODUODENOSCOPY N/A 05/31/2018   Procedure: ESOPHAGOGASTRODUODENOSCOPY (EGD);  Surgeon: Danie Binder, MD;  normal examined  esophagus, gastritis due to ASA, and normal examined duodenum.  No obvious source for melena.   Marland Kitchen FRACTURE SURGERY    . INTRAVASCULAR PRESSURE WIRE/FFR STUDY N/A 06/08/2017   Procedure: INTRAVASCULAR PRESSURE WIRE/FFR STUDY;  Surgeon: Nelva Bush, MD;  Location: [place] CV LAB;  Service: Cardiovascular;  Laterality: N/A;  . LEFT HEART CATH AND CORONARY ANGIOGRAPHY N/A 06/08/2017   Procedure: LEFT HEART CATH AND CORONARY ANGIOGRAPHY;  Surgeon: Nelva Bush, MD;  Location: [place] CV LAB;  Service: Cardiovascular;  Laterality: N/A;  . LEFT HEART CATH AND CORONARY ANGIOGRAPHY N/A 06/05/2018   Procedure: LEFT HEART CATH AND CORONARY ANGIOGRAPHY;  Surgeon: Belva Crome, MD;  Location: [place] CV LAB;  Service: Cardiovascular;  Laterality: N/A;  . LEFT HEART CATHETERIZATION WITH CORONARY ANGIOGRAM N/A 04/09/2014   Procedure: LEFT HEART CATHETERIZATION WITH CORONARY ANGIOGRAM;  Surgeon: Leonie Man, MD;  Location: [facility] CATH LAB;  Service: Cardiovascular;  Laterality: N/A;  . NASAL SEPTUM SURGERY  1999  . POLYPECTOMY  01/22/2017   Procedure: POLYPECTOMY;  Surgeon: Danie Binder, MD;  Location: [facility] ENDO SUITE;  Service: Endoscopy;;  right colon x4  . POLYPECTOMY  03/02/2020   Procedure: POLYPECTOMY;  Surgeon: Eloise Harman, DO;  Location: [facility] ENDO SUITE;  Service: Endoscopy;;  . TEE WITHOUT CARDIOVERSION N/A 06/07/2018   Procedure: TRANSESOPHAGEAL ECHOCARDIOGRAM (TEE);  Surgeon: Jolaine Artist, MD;  Location: [facility] ENDOSCOPY;  Service: Cardiovascular;  Laterality: N/A;    Current Outpatient Medications  Medication Sig Dispense Refill  . albuterol (VENTOLIN HFA) 108 (90 Base) MCG/ACT inhaler Inhale 2 puffs into the lungs every 6 (six) hours as needed for wheezing or shortness of breath.    Marland Kitchen apixaban (ELIQUIS) 5 MG TABS tablet Take 1 tablet (5 mg total) by mouth every 12 (twelve) hours. 60 tablet 11  . atorvastatin (LIPITOR) 80 MG tablet Take 80 mg by mouth at bedtime.     .  diphenhydramine-acetaminophen (TYLENOL PM) 25-500 MG TABS tablet Take 2 tablets by mouth at bedtime.     Marland Kitchen doxazosin (CARDURA) 8 MG tablet Take 8 mg by mouth at bedtime.     . fluticasone (FLONASE) 50 MCG/ACT nasal spray Place 2 sprays into the nose daily as needed (allergies).    . Hypromellose (ARTIFICIAL TEARS OP) Place 1-2 drops into both eyes daily as needed (for dry eyes).    Marland Kitchen lisinopril (PRINIVIL,ZESTRIL) 2.5 MG tablet TAKE 1 TABLET BY MOUTH EVERY DAY (Patient taking differently: Take 2.5 mg by mouth at bedtime.) 90 tablet 1  . loratadine (CLARITIN) 10 MG tablet Take 10 mg by mouth daily.    . metoprolol succinate (TOPROL-XL) 25 MG 24 hr tablet Take 25 mg by mouth daily.     . nitroGLYCERIN (NITROSTAT) 0.4 MG SL tablet PLACE 1 TABLET (0.4 MG TOTAL) UNDER THE TONGUE EVERY 5 (FIVE) MINUTES AS NEEDED FOR CHEST PAIN. 25 tablet 3  . pantoprazole (PROTONIX) 40 MG tablet TAKE 1 TABLET BY MOUTH DAILY 30 tablet 6  . Probiotic Product (  PROBIOTIC PO) Take 1 capsule by mouth in the morning and at bedtime.    . ranolazine (RANEXA) 500 MG 12 hr tablet Take 500 mg by mouth 2 (two) times daily.     Marland Kitchen senna (SENOKOT) 8.6 MG tablet Take 2 tablets by mouth at bedtime.    . vitamin B-12 (CYANOCOBALAMIN) 1000 MCG tablet Take 1,000 mcg by mouth daily.     No current facility-administered medications for this visit.    Allergies as of 05/13/2020 - Review Complete 05/13/2020  Allergen Reaction Noted  . Vancomycin Rash 06/06/2010    Family History  Problem Relation Age of Onset  . Heart disease Mother   . Colon cancer Neg Hx   . Gastric cancer Neg Hx   . Esophageal cancer Neg Hx     Social History   Socioeconomic History  . Marital status: Married    Spouse name: Not on file  . Number of children: Not on file  . Years of education: Not on file  . Highest education level: Not on file  Occupational History    Comment: Retired  Tobacco Use  . Smoking status: Former Smoker    Packs/day: 1.50     Years: 22.00    Pack years: 33.00    Types: Cigarettes    Start date: 11/28/1942    Quit date: 02/07/1964    Years since quitting: 56.3  . Smokeless tobacco: Never Used  Vaping Use  . Vaping Use: Never used  Substance and Sexual Activity  . Alcohol use: No    Alcohol/week: 0.0 standard drinks  . Drug use: No  . Sexual activity: Yes  Other Topics Concern  . Not on file  Social History Narrative  . Not on file   Social Determinants of Health   Financial Resource Strain: Not on file  Food Insecurity: Not on file  Transportation Needs: Not on file  Physical Activity: Not on file  Stress: Not on file  Social Connections: Not on file    Review of Systems: Gen: Denies fever or chills.  Admits to sinus congestion and nasal drainage.  Denies lightheadedness, dizziness, presyncope, syncope. CV: Denies chest pain or palpitations Resp: Denies dyspnea or cough.Marland Kitchen GI: See HPI Heme: See HPI  Physical Exam: BP (!) 150/80   Pulse 62   Temp (!) 97.1 F (36.2 C) (Temporal)   Ht 5\' 9"  (1.753 m)   Wt 169 lb 3.2 oz (76.7 kg)   BMI 24.99 kg/m  General: Alert and oriented. No distress noted. Pleasant and cooperative.  Head:  Normocephalic and atraumatic. Eyes:  Conjuctiva clear without scleral icterus. Heart:  S1, S2 present without murmurs appreciated. Lungs:  Clear to auscultation bilaterally. No wheezes, rales, or rhonchi. No distress.  Abdomen:  +BS, soft, non-tender and non-distended. No rebound or guarding. No HSM or masses noted. Msk:  Symmetrical without gross deformities. Normal posture. Extremities:  Without edema. Neurologic:  Alert and  oriented x4 Psych: Normal mood and affect.   Assessment: 80 year old male presenting today for follow-up of lower abdominal pain in the setting of constipation s/p colonoscopy on 03/02/2020 which revealed nonbleeding internal hemorrhoids, multiple diverticula in the sigmoid, descending, transverse colon, 3 colon polyps with pathology  revealing tubular adenoma hyperplastic polyp.  Clinically, patient is doing very well with stool softener and probiotic daily.  Constipation and lower abdominal pain have resolved.  Denies BRBPR, melena, or unintentional weight loss.  Also with history of GERD which remains well controlled on Protonix 40 mg daly.  No alarm symptoms.   Plan: 1.  Continue stool softeners and probiotic daily. 2.  Drink plenty of water and eat fruits and vegetables daily to maintain adequate fiber intake.  3.  Continue Protonix 40 mg daily 30 minutes before breakfast. 4.  Counseled on GERD diet/lifestyle.  Written instructions provided. 5.  Follow-up in 1 year or sooner if needed.    Aliene Altes, PA-C [facility] Gastroenterology 05/13/2020

## 2020-05-13 ENCOUNTER — Encounter: Payer: Self-pay | Admitting: Gastroenterology

## 2020-05-13 ENCOUNTER — Ambulatory Visit: Payer: PPO | Admitting: Gastroenterology

## 2020-05-13 ENCOUNTER — Other Ambulatory Visit: Payer: Self-pay

## 2020-05-13 VITALS — BP 150/80 | HR 62 | Temp 97.1°F | Ht 69.0 in | Wt 169.2 lb

## 2020-05-13 DIAGNOSIS — K219 Gastro-esophageal reflux disease without esophagitis: Secondary | ICD-10-CM | POA: Diagnosis not present

## 2020-05-13 DIAGNOSIS — R1030 Lower abdominal pain, unspecified: Secondary | ICD-10-CM

## 2020-05-13 DIAGNOSIS — K5900 Constipation, unspecified: Secondary | ICD-10-CM

## 2020-05-13 IMAGING — CR PORTABLE CHEST - 1 VIEW
1 series · 1 of 1 positions shown · non-contrast
Comparison: Radiographs June 07, 2017.

CLINICAL DATA: Cough, shortness of breath.

EXAM:
PORTABLE CHEST 1 VIEW

[pa]
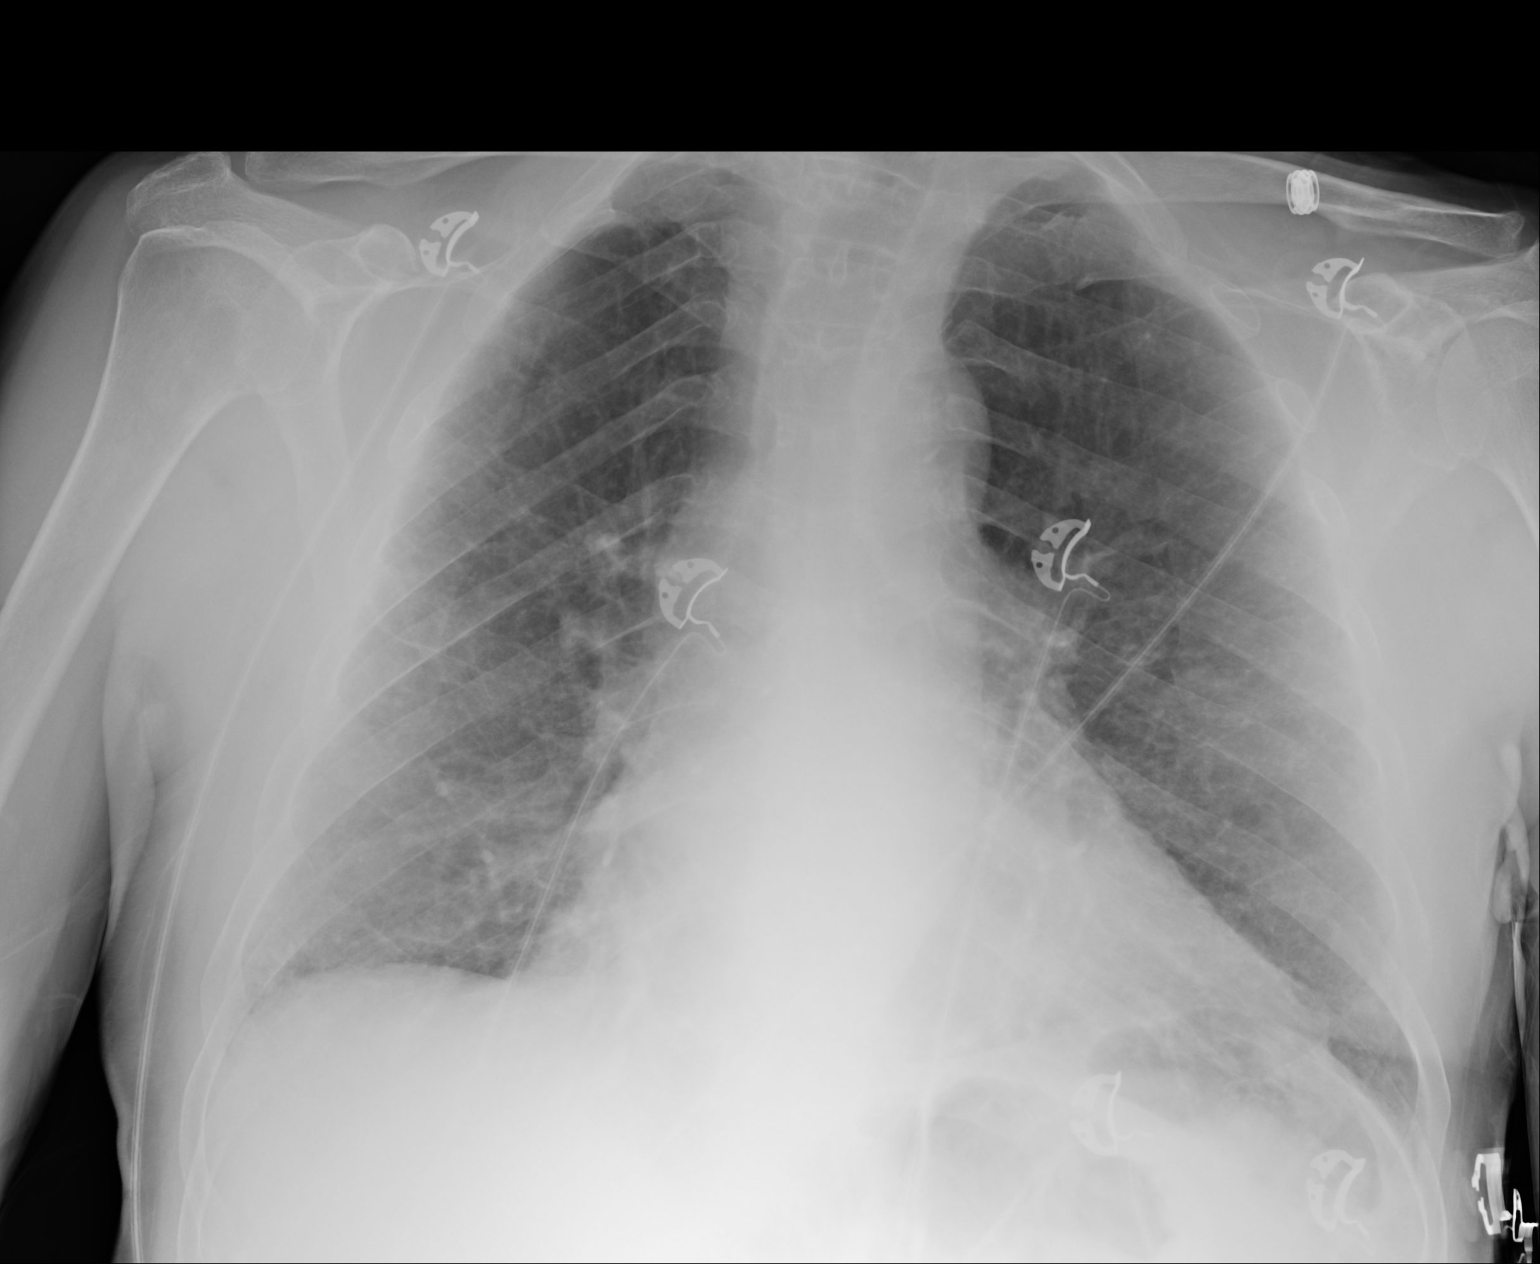

[1 of 1 positions shown; findings below may reference images not displayed]

FINDINGS: Stable cardiomediastinal silhouette. No pneumothorax or pleural
effusion is. Both lungs are clear. The visualized skeletal
structures are unremarkable.
IMPRESSION: No active disease.

## 2020-05-13 NOTE — Patient Instructions (Signed)
Continues taking stool softeners and probiotic daily is assisting her constipation well.  Drink plenty of water and eat fruits and vegetables daily to maintain adequate fiber intake.   Continue Protonix 40 mg daily 30 minutes before breakfast for acid reflux.  General GERD diet/lifestyle recommendations: Avoid fried, fatty, greasy, spicy, citrus foods. Avoid caffeine and carbonated beverages. Avoid chocolate. Try eating 4-6 small meals a day rather than 3 large meals. Do not eat within 3 hours of laying down.  We will see you back in 1 year or sooner if needed.   Small bowel.  Gastric biopsy benign polyp Aliene Altes, PA-C [facility]

## 2020-05-18 IMAGING — DX DG ABDOMEN ACUTE W/ 1V CHEST
3 series · 3 of 3 positions shown · non-contrast
Comparison: 06/03/2018

CLINICAL DATA: Bilateral lower abdominal pain

EXAM:
DG ABDOMEN ACUTE W/ 1V CHEST

[chest pa]
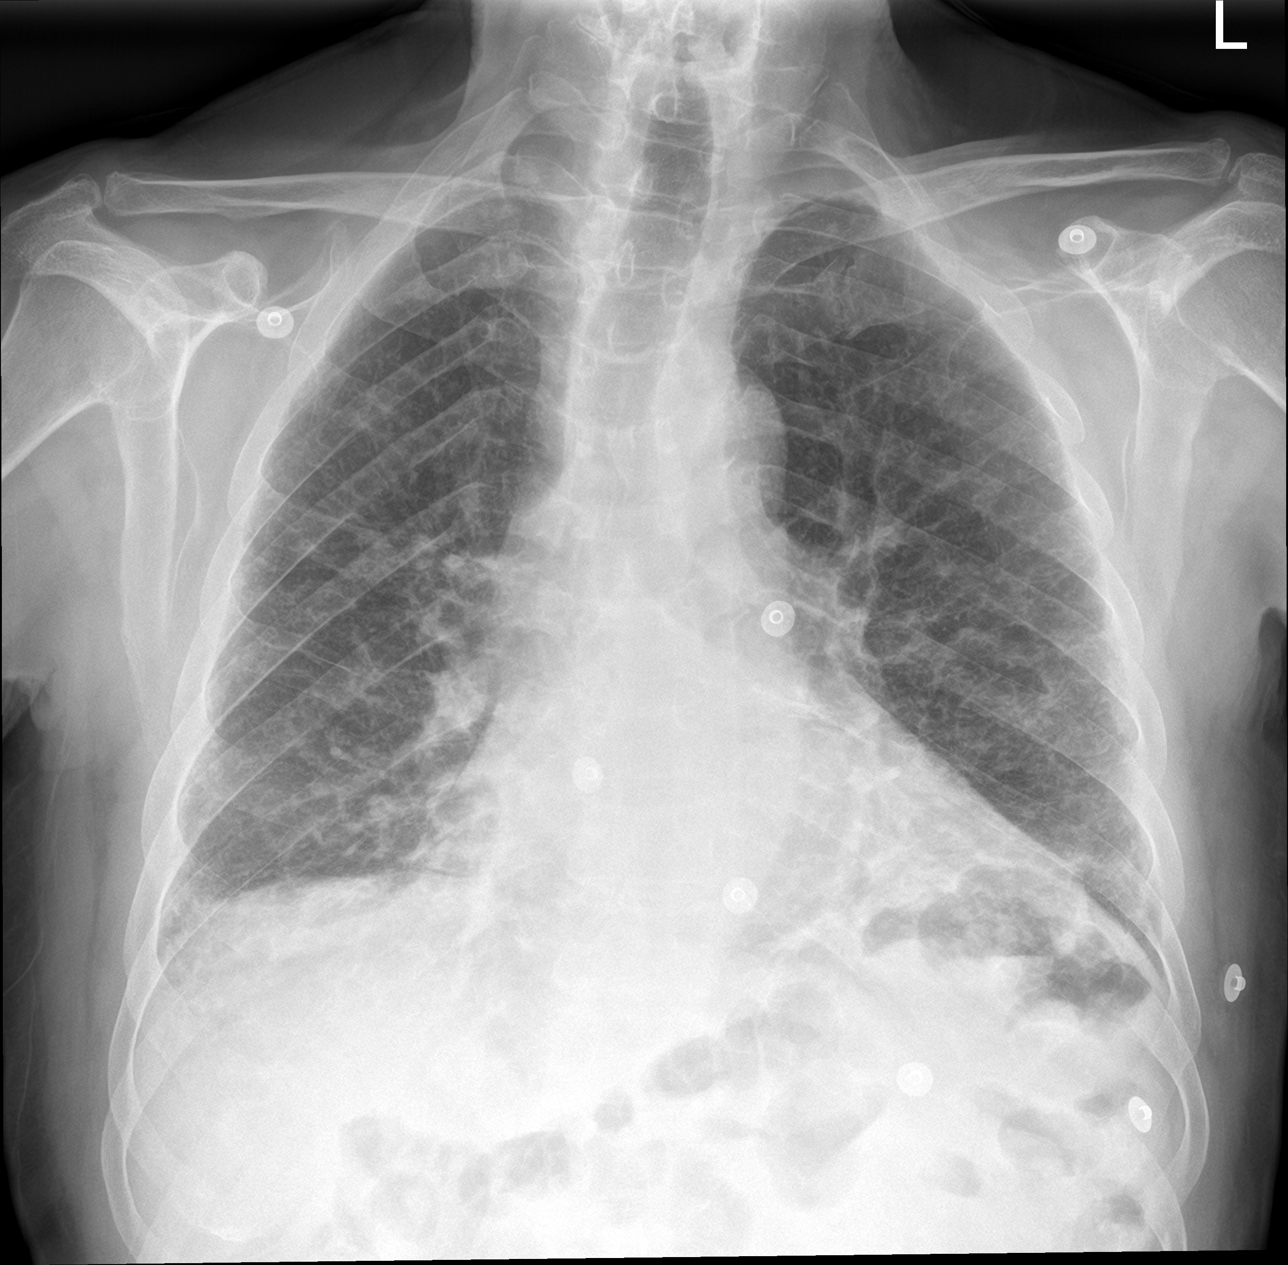

[abdomen erect]
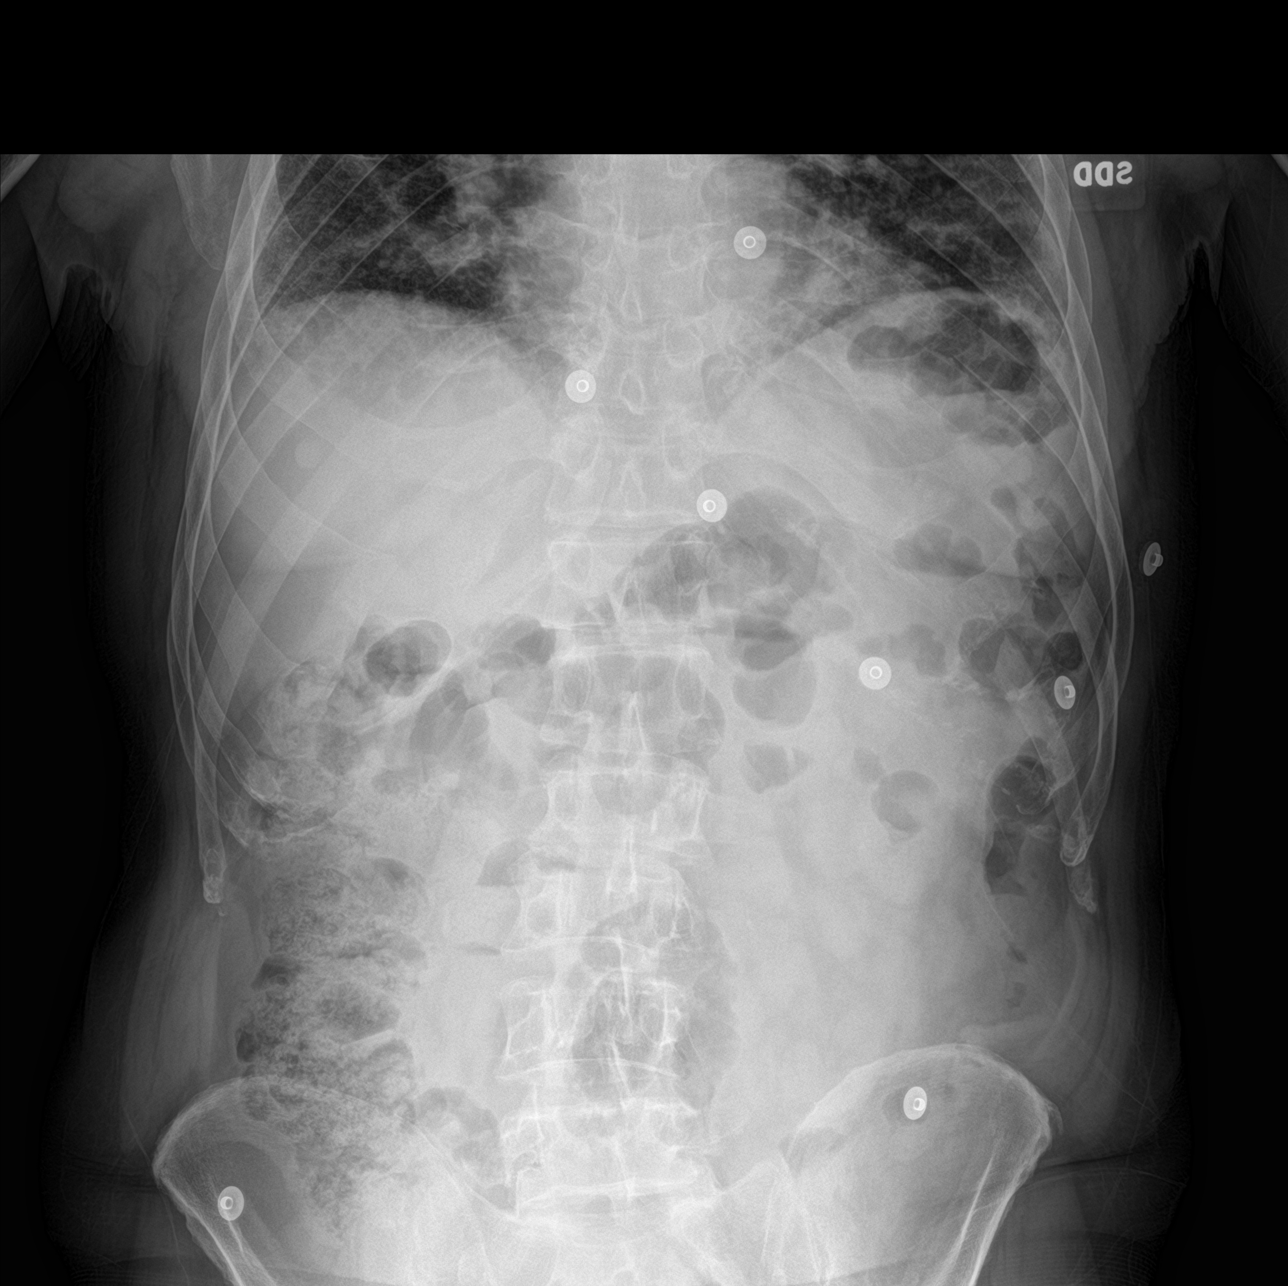

[abdomen supine]
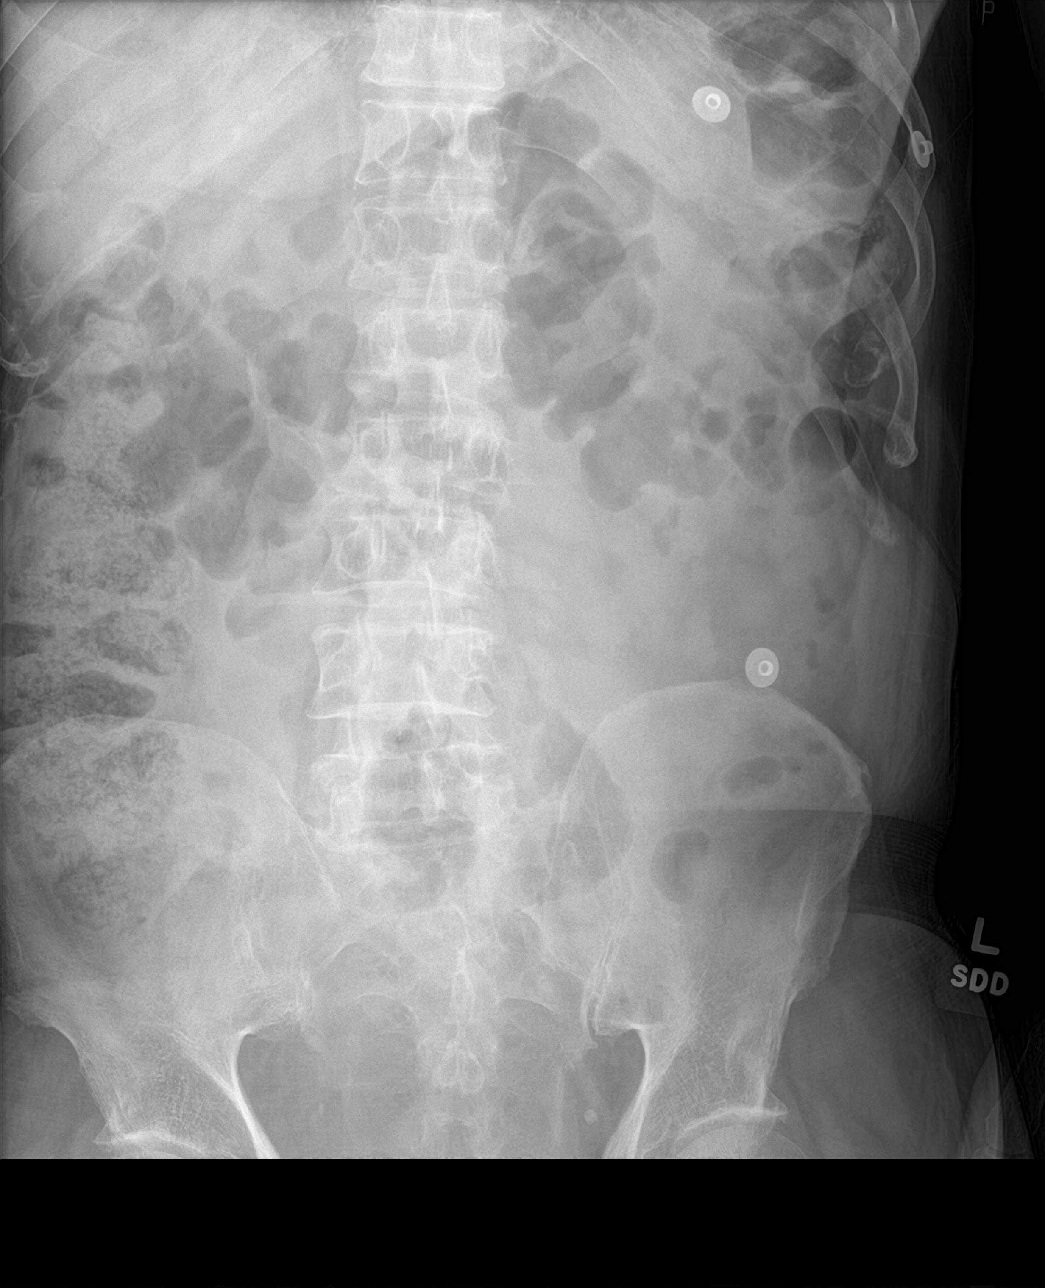

[3 of 3 positions shown; findings below may reference images not displayed]

FINDINGS: Cardiomegaly. Chronic interstitial prominence and areas of scarring
in the mid and lower lungs. No acute airspace opacities or
effusions.

Nonobstructive bowel gas pattern. Moderate stool within the colon.
No organomegaly or free air. No acute bony abnormality.
IMPRESSION: No evidence of bowel obstruction or free air. Moderate stool burden.

Chronic interstitial lung disease/fibrosis in the mid and lower
lungs. No acute cardiopulmonary disease.

## 2020-06-14 DIAGNOSIS — J189 Pneumonia, unspecified organism: Secondary | ICD-10-CM | POA: Diagnosis not present

## 2020-06-14 DIAGNOSIS — J329 Chronic sinusitis, unspecified: Secondary | ICD-10-CM | POA: Diagnosis not present

## 2020-06-19 DIAGNOSIS — Z20822 Contact with and (suspected) exposure to covid-19: Secondary | ICD-10-CM | POA: Diagnosis not present

## 2020-06-19 DIAGNOSIS — R0789 Other chest pain: Secondary | ICD-10-CM | POA: Diagnosis not present

## 2020-06-19 DIAGNOSIS — J441 Chronic obstructive pulmonary disease with (acute) exacerbation: Secondary | ICD-10-CM | POA: Diagnosis not present

## 2020-06-19 DIAGNOSIS — I2510 Atherosclerotic heart disease of native coronary artery without angina pectoris: Secondary | ICD-10-CM | POA: Diagnosis not present

## 2020-06-19 DIAGNOSIS — R519 Headache, unspecified: Secondary | ICD-10-CM | POA: Diagnosis not present

## 2020-06-19 DIAGNOSIS — J189 Pneumonia, unspecified organism: Secondary | ICD-10-CM | POA: Diagnosis not present

## 2020-06-21 DIAGNOSIS — R0602 Shortness of breath: Secondary | ICD-10-CM | POA: Diagnosis not present

## 2020-06-21 DIAGNOSIS — R059 Cough, unspecified: Secondary | ICD-10-CM | POA: Diagnosis not present

## 2020-06-21 DIAGNOSIS — J441 Chronic obstructive pulmonary disease with (acute) exacerbation: Secondary | ICD-10-CM | POA: Diagnosis not present

## 2020-06-21 DIAGNOSIS — I10 Essential (primary) hypertension: Secondary | ICD-10-CM | POA: Diagnosis not present

## 2020-06-21 DIAGNOSIS — J440 Chronic obstructive pulmonary disease with acute lower respiratory infection: Secondary | ICD-10-CM | POA: Diagnosis not present

## 2020-06-21 DIAGNOSIS — I480 Paroxysmal atrial fibrillation: Secondary | ICD-10-CM | POA: Diagnosis not present

## 2020-06-21 DIAGNOSIS — I2510 Atherosclerotic heart disease of native coronary artery without angina pectoris: Secondary | ICD-10-CM | POA: Diagnosis not present

## 2020-06-21 DIAGNOSIS — I491 Atrial premature depolarization: Secondary | ICD-10-CM | POA: Diagnosis not present

## 2020-06-21 DIAGNOSIS — J181 Lobar pneumonia, unspecified organism: Secondary | ICD-10-CM | POA: Diagnosis not present

## 2020-06-21 DIAGNOSIS — Z955 Presence of coronary angioplasty implant and graft: Secondary | ICD-10-CM | POA: Diagnosis not present

## 2020-06-21 DIAGNOSIS — I420 Dilated cardiomyopathy: Secondary | ICD-10-CM | POA: Diagnosis not present

## 2020-06-21 DIAGNOSIS — Z7901 Long term (current) use of anticoagulants: Secondary | ICD-10-CM | POA: Diagnosis not present

## 2020-06-21 DIAGNOSIS — R918 Other nonspecific abnormal finding of lung field: Secondary | ICD-10-CM | POA: Diagnosis not present

## 2020-06-21 DIAGNOSIS — J189 Pneumonia, unspecified organism: Secondary | ICD-10-CM | POA: Diagnosis not present

## 2020-06-22 DIAGNOSIS — R0602 Shortness of breath: Secondary | ICD-10-CM | POA: Diagnosis not present

## 2020-06-22 DIAGNOSIS — Z6824 Body mass index (BMI) 24.0-24.9, adult: Secondary | ICD-10-CM | POA: Diagnosis not present

## 2020-06-22 DIAGNOSIS — J329 Chronic sinusitis, unspecified: Secondary | ICD-10-CM | POA: Diagnosis not present

## 2020-06-29 DIAGNOSIS — R0609 Other forms of dyspnea: Secondary | ICD-10-CM | POA: Diagnosis not present

## 2020-06-29 DIAGNOSIS — R059 Cough, unspecified: Secondary | ICD-10-CM | POA: Diagnosis not present

## 2020-07-06 DIAGNOSIS — Z6824 Body mass index (BMI) 24.0-24.9, adult: Secondary | ICD-10-CM | POA: Diagnosis not present

## 2020-07-06 DIAGNOSIS — R059 Cough, unspecified: Secondary | ICD-10-CM | POA: Diagnosis not present

## 2020-07-06 DIAGNOSIS — G4700 Insomnia, unspecified: Secondary | ICD-10-CM | POA: Diagnosis not present

## 2020-07-22 DIAGNOSIS — M2669 Other specified disorders of temporomandibular joint: Secondary | ICD-10-CM | POA: Diagnosis not present

## 2020-07-22 DIAGNOSIS — Z6824 Body mass index (BMI) 24.0-24.9, adult: Secondary | ICD-10-CM | POA: Diagnosis not present

## 2020-08-05 DIAGNOSIS — I25111 Atherosclerotic heart disease of native coronary artery with angina pectoris with documented spasm: Secondary | ICD-10-CM | POA: Diagnosis not present

## 2020-08-05 DIAGNOSIS — E7849 Other hyperlipidemia: Secondary | ICD-10-CM | POA: Diagnosis not present

## 2020-08-05 DIAGNOSIS — I482 Chronic atrial fibrillation, unspecified: Secondary | ICD-10-CM | POA: Diagnosis not present

## 2020-08-05 DIAGNOSIS — I10 Essential (primary) hypertension: Secondary | ICD-10-CM | POA: Diagnosis not present

## 2020-08-13 DIAGNOSIS — E7800 Pure hypercholesterolemia, unspecified: Secondary | ICD-10-CM | POA: Diagnosis not present

## 2020-08-13 DIAGNOSIS — E7849 Other hyperlipidemia: Secondary | ICD-10-CM | POA: Diagnosis not present

## 2020-08-13 DIAGNOSIS — K2100 Gastro-esophageal reflux disease with esophagitis, without bleeding: Secondary | ICD-10-CM | POA: Diagnosis not present

## 2020-08-13 DIAGNOSIS — D519 Vitamin B12 deficiency anemia, unspecified: Secondary | ICD-10-CM | POA: Diagnosis not present

## 2020-08-13 DIAGNOSIS — E7801 Familial hypercholesterolemia: Secondary | ICD-10-CM | POA: Diagnosis not present

## 2020-08-13 DIAGNOSIS — D649 Anemia, unspecified: Secondary | ICD-10-CM | POA: Diagnosis not present

## 2020-08-13 DIAGNOSIS — D529 Folate deficiency anemia, unspecified: Secondary | ICD-10-CM | POA: Diagnosis not present

## 2020-08-13 DIAGNOSIS — Z1329 Encounter for screening for other suspected endocrine disorder: Secondary | ICD-10-CM | POA: Diagnosis not present

## 2020-08-13 DIAGNOSIS — E782 Mixed hyperlipidemia: Secondary | ICD-10-CM | POA: Diagnosis not present

## 2020-08-16 DIAGNOSIS — D649 Anemia, unspecified: Secondary | ICD-10-CM | POA: Diagnosis not present

## 2020-08-16 DIAGNOSIS — D6869 Other thrombophilia: Secondary | ICD-10-CM | POA: Diagnosis not present

## 2020-08-16 DIAGNOSIS — I482 Chronic atrial fibrillation, unspecified: Secondary | ICD-10-CM | POA: Diagnosis not present

## 2020-08-16 DIAGNOSIS — Z0001 Encounter for general adult medical examination with abnormal findings: Secondary | ICD-10-CM | POA: Diagnosis not present

## 2020-08-16 DIAGNOSIS — N401 Enlarged prostate with lower urinary tract symptoms: Secondary | ICD-10-CM | POA: Diagnosis not present

## 2020-08-16 DIAGNOSIS — E7849 Other hyperlipidemia: Secondary | ICD-10-CM | POA: Diagnosis not present

## 2020-08-16 DIAGNOSIS — I25111 Atherosclerotic heart disease of native coronary artery with angina pectoris with documented spasm: Secondary | ICD-10-CM | POA: Diagnosis not present

## 2020-08-16 DIAGNOSIS — I10 Essential (primary) hypertension: Secondary | ICD-10-CM | POA: Diagnosis not present

## 2020-09-05 DIAGNOSIS — E7849 Other hyperlipidemia: Secondary | ICD-10-CM | POA: Diagnosis not present

## 2020-09-05 DIAGNOSIS — I482 Chronic atrial fibrillation, unspecified: Secondary | ICD-10-CM | POA: Diagnosis not present

## 2020-09-05 DIAGNOSIS — I25111 Atherosclerotic heart disease of native coronary artery with angina pectoris with documented spasm: Secondary | ICD-10-CM | POA: Diagnosis not present

## 2020-09-05 DIAGNOSIS — I10 Essential (primary) hypertension: Secondary | ICD-10-CM | POA: Diagnosis not present

## 2020-12-06 DIAGNOSIS — I25111 Atherosclerotic heart disease of native coronary artery with angina pectoris with documented spasm: Secondary | ICD-10-CM | POA: Diagnosis not present

## 2020-12-06 DIAGNOSIS — E7849 Other hyperlipidemia: Secondary | ICD-10-CM | POA: Diagnosis not present

## 2020-12-06 DIAGNOSIS — I482 Chronic atrial fibrillation, unspecified: Secondary | ICD-10-CM | POA: Diagnosis not present

## 2020-12-06 DIAGNOSIS — I10 Essential (primary) hypertension: Secondary | ICD-10-CM | POA: Diagnosis not present

## 2020-12-13 DIAGNOSIS — D519 Vitamin B12 deficiency anemia, unspecified: Secondary | ICD-10-CM | POA: Diagnosis not present

## 2020-12-13 DIAGNOSIS — I10 Essential (primary) hypertension: Secondary | ICD-10-CM | POA: Diagnosis not present

## 2020-12-13 DIAGNOSIS — K2100 Gastro-esophageal reflux disease with esophagitis, without bleeding: Secondary | ICD-10-CM | POA: Diagnosis not present

## 2020-12-13 DIAGNOSIS — E782 Mixed hyperlipidemia: Secondary | ICD-10-CM | POA: Diagnosis not present

## 2020-12-13 DIAGNOSIS — E7849 Other hyperlipidemia: Secondary | ICD-10-CM | POA: Diagnosis not present

## 2020-12-13 DIAGNOSIS — D649 Anemia, unspecified: Secondary | ICD-10-CM | POA: Diagnosis not present

## 2020-12-13 DIAGNOSIS — D529 Folate deficiency anemia, unspecified: Secondary | ICD-10-CM | POA: Diagnosis not present

## 2020-12-20 DIAGNOSIS — E7849 Other hyperlipidemia: Secondary | ICD-10-CM | POA: Diagnosis not present

## 2020-12-20 DIAGNOSIS — D6869 Other thrombophilia: Secondary | ICD-10-CM | POA: Diagnosis not present

## 2020-12-20 DIAGNOSIS — N401 Enlarged prostate with lower urinary tract symptoms: Secondary | ICD-10-CM | POA: Diagnosis not present

## 2020-12-20 DIAGNOSIS — K2100 Gastro-esophageal reflux disease with esophagitis, without bleeding: Secondary | ICD-10-CM | POA: Diagnosis not present

## 2020-12-20 DIAGNOSIS — I482 Chronic atrial fibrillation, unspecified: Secondary | ICD-10-CM | POA: Diagnosis not present

## 2020-12-20 DIAGNOSIS — I10 Essential (primary) hypertension: Secondary | ICD-10-CM | POA: Diagnosis not present

## 2020-12-20 DIAGNOSIS — I25119 Atherosclerotic heart disease of native coronary artery with unspecified angina pectoris: Secondary | ICD-10-CM | POA: Diagnosis not present

## 2020-12-20 DIAGNOSIS — D649 Anemia, unspecified: Secondary | ICD-10-CM | POA: Diagnosis not present

## 2021-01-05 DIAGNOSIS — E7849 Other hyperlipidemia: Secondary | ICD-10-CM | POA: Diagnosis not present

## 2021-01-05 DIAGNOSIS — I25111 Atherosclerotic heart disease of native coronary artery with angina pectoris with documented spasm: Secondary | ICD-10-CM | POA: Diagnosis not present

## 2021-01-05 DIAGNOSIS — I482 Chronic atrial fibrillation, unspecified: Secondary | ICD-10-CM | POA: Diagnosis not present

## 2021-01-05 DIAGNOSIS — I10 Essential (primary) hypertension: Secondary | ICD-10-CM | POA: Diagnosis not present

## 2021-04-05 DIAGNOSIS — E7849 Other hyperlipidemia: Secondary | ICD-10-CM | POA: Diagnosis not present

## 2021-04-05 DIAGNOSIS — I10 Essential (primary) hypertension: Secondary | ICD-10-CM | POA: Diagnosis not present

## 2021-04-13 DIAGNOSIS — E7849 Other hyperlipidemia: Secondary | ICD-10-CM | POA: Diagnosis not present

## 2021-04-13 DIAGNOSIS — K2100 Gastro-esophageal reflux disease with esophagitis, without bleeding: Secondary | ICD-10-CM | POA: Diagnosis not present

## 2021-04-13 DIAGNOSIS — D519 Vitamin B12 deficiency anemia, unspecified: Secondary | ICD-10-CM | POA: Diagnosis not present

## 2021-04-13 DIAGNOSIS — E782 Mixed hyperlipidemia: Secondary | ICD-10-CM | POA: Diagnosis not present

## 2021-04-13 DIAGNOSIS — D649 Anemia, unspecified: Secondary | ICD-10-CM | POA: Diagnosis not present

## 2021-04-13 DIAGNOSIS — I10 Essential (primary) hypertension: Secondary | ICD-10-CM | POA: Diagnosis not present

## 2021-04-13 DIAGNOSIS — D529 Folate deficiency anemia, unspecified: Secondary | ICD-10-CM | POA: Diagnosis not present

## 2021-04-13 DIAGNOSIS — Z1329 Encounter for screening for other suspected endocrine disorder: Secondary | ICD-10-CM | POA: Diagnosis not present

## 2021-04-19 DIAGNOSIS — D649 Anemia, unspecified: Secondary | ICD-10-CM | POA: Diagnosis not present

## 2021-04-19 DIAGNOSIS — I10 Essential (primary) hypertension: Secondary | ICD-10-CM | POA: Diagnosis not present

## 2021-04-19 DIAGNOSIS — I25119 Atherosclerotic heart disease of native coronary artery with unspecified angina pectoris: Secondary | ICD-10-CM | POA: Diagnosis not present

## 2021-04-19 DIAGNOSIS — N401 Enlarged prostate with lower urinary tract symptoms: Secondary | ICD-10-CM | POA: Diagnosis not present

## 2021-04-19 DIAGNOSIS — I482 Chronic atrial fibrillation, unspecified: Secondary | ICD-10-CM | POA: Diagnosis not present

## 2021-04-19 DIAGNOSIS — K2100 Gastro-esophageal reflux disease with esophagitis, without bleeding: Secondary | ICD-10-CM | POA: Diagnosis not present

## 2021-04-19 DIAGNOSIS — E7849 Other hyperlipidemia: Secondary | ICD-10-CM | POA: Diagnosis not present

## 2021-04-19 DIAGNOSIS — D6869 Other thrombophilia: Secondary | ICD-10-CM | POA: Diagnosis not present

## 2021-04-29 DIAGNOSIS — I10 Essential (primary) hypertension: Secondary | ICD-10-CM | POA: Diagnosis not present

## 2021-04-29 DIAGNOSIS — Z20828 Contact with and (suspected) exposure to other viral communicable diseases: Secondary | ICD-10-CM | POA: Diagnosis not present

## 2021-04-29 DIAGNOSIS — J189 Pneumonia, unspecified organism: Secondary | ICD-10-CM | POA: Diagnosis not present

## 2021-05-01 DIAGNOSIS — J189 Pneumonia, unspecified organism: Secondary | ICD-10-CM | POA: Diagnosis not present

## 2021-05-01 DIAGNOSIS — I491 Atrial premature depolarization: Secondary | ICD-10-CM | POA: Diagnosis not present

## 2021-05-01 DIAGNOSIS — I10 Essential (primary) hypertension: Secondary | ICD-10-CM | POA: Diagnosis not present

## 2021-05-01 DIAGNOSIS — R0602 Shortness of breath: Secondary | ICD-10-CM | POA: Diagnosis not present

## 2021-05-01 DIAGNOSIS — Z87891 Personal history of nicotine dependence: Secondary | ICD-10-CM | POA: Diagnosis not present

## 2021-05-01 DIAGNOSIS — J441 Chronic obstructive pulmonary disease with (acute) exacerbation: Secondary | ICD-10-CM | POA: Diagnosis not present

## 2021-05-01 DIAGNOSIS — R079 Chest pain, unspecified: Secondary | ICD-10-CM | POA: Diagnosis not present

## 2021-05-02 ENCOUNTER — Ambulatory Visit (INDEPENDENT_AMBULATORY_CARE_PROVIDER_SITE_OTHER): Payer: PPO | Admitting: Physician Assistant

## 2021-05-02 ENCOUNTER — Telehealth: Payer: Self-pay | Admitting: Cardiology

## 2021-05-02 ENCOUNTER — Encounter: Payer: Self-pay | Admitting: Physician Assistant

## 2021-05-02 ENCOUNTER — Other Ambulatory Visit: Payer: Self-pay

## 2021-05-02 VITALS — BP 150/90 | HR 84 | Ht 69.0 in | Wt 162.2 lb

## 2021-05-02 DIAGNOSIS — I10 Essential (primary) hypertension: Secondary | ICD-10-CM | POA: Diagnosis not present

## 2021-05-02 DIAGNOSIS — J849 Interstitial pulmonary disease, unspecified: Secondary | ICD-10-CM | POA: Diagnosis not present

## 2021-05-02 DIAGNOSIS — J189 Pneumonia, unspecified organism: Secondary | ICD-10-CM | POA: Diagnosis not present

## 2021-05-02 DIAGNOSIS — I5033 Acute on chronic diastolic (congestive) heart failure: Secondary | ICD-10-CM

## 2021-05-02 DIAGNOSIS — I483 Typical atrial flutter: Secondary | ICD-10-CM

## 2021-05-02 MED ORDER — POTASSIUM CHLORIDE ER 10 MEQ PO TBCR: EXTENDED_RELEASE_TABLET | ORAL | 0 refills | Status: DC

## 2021-05-02 MED ORDER — FUROSEMIDE 20 MG PO TABS: ORAL_TABLET | ORAL | 3 refills | Status: DC

## 2021-05-02 NOTE — Progress Notes (Signed)
? ?Cardiology Office Note   ? ?Date:  05/02/2021  ? ?ID:  [name], DOB [birth year], MRN [id number] ? ? ?PCP:  Caryl Bis, MD ?  ?[place]  ?Cardiologist:  Carlyle Dolly, MD   ?Advanced Practice Provider:  No care team member to display ?Electrophysiologist:  None  ? ?[id number]}  ? ?No chief complaint on file. ? ? ?History of Present Illness:  ?Eric Benitez is a 81 y.o. male with history of CAD status post stenting 10 years ago cath 2016 patent coronaries cath 06/2017 patent coronaries cath 05/2018 patent vessels and stents ostial diagonal 60 to 70% stenosis, atrial flutter status post TEE DCCV 05/2018 on Eliquis.  EF 55 to 60% at that time ? ?Patient last saw Dr. Harl Bowie 02/24/2020 and was doing well.  No changes made. ? ?Patient was seen by PCP Friday and started on antibiotics for pneumonia.  He had worsening symptoms of cough and shortness of breath and went to [facility] emergency room.  Labs reviewed potassium 3.4 creatinine 1.31 troponin negative D-dimer normal BNP 1798 AST, CXR coarse opacity bases. Given IV lasix 40 mg x1. Prednisone, tessalon doxy and augmentin. ? ?Patient says he was coughing so much yesterday he thought he cracked a rib. No appetite, dizzy and off balance-going on for awhile-supposed to do physical therapy. BP high with pneumonia. Ankles and face swollen and improved with IV lasix.  ? ?Past Medical History:  ?Diagnosis Date  ? Arthritis   ? "neck, hands, fingers" (04/09/2014)  ? Bleeds easily ([place])   ? CAD (coronary artery disease)   ? a. s/p prior stenting of LAD b.  patent stent by cath in 2016 and 06/2017 --> residual 60% D1 stenosis and moderate disease along the RCA  ? COPD (chronic obstructive pulmonary disease) ([place])   ? GERD (gastroesophageal reflux disease)   ? Hyperlipemia   ? Hypertension   ? Kidney stones   ? "I've had several; had to go in and get them once" (04/09/2014)  ? Pneumonia 1940's  ? Sleep apnea   ? ? ?Past Surgical History:   ?Procedure Laterality Date  ? ANKLE FRACTURE SURGERY Right 2009  ? CARDIAC CATHETERIZATION  1990's X 2;  04/09/2014  ? CARDIOVERSION N/A 06/07/2018  ? Procedure: CARDIOVERSION;  Surgeon: Jolaine Artist, MD;  Location: [facility] ENDOSCOPY;  Service: Cardiovascular;  Laterality: N/A;  ? COLONOSCOPY N/A 01/22/2017  ? Dr. Oneida Alar: 6 tubular adenomas removed, external hemorrhoids.  Recommended next exam in 3 years.  ? COLONOSCOPY WITH PROPOFOL N/A 03/02/2020  ? Surgeon: Eloise Harman, DO; Nonbleeding internal hemorrhoids, multiple diverticula in the sigmoid, descending, transverse colon, 2 polyps 4 to 6 mm in size and one 2 mm polyp.  Pathology with tubular adenoma and hyperplastic polyp. Recommended repeat in 5 years.  ? CORONARY ANGIOPLASTY WITH STENT PLACEMENT  2006  ? "2"  ? CYSTOSCOPY W/ STONE MANIPULATION  ~ 2008  ? ESOPHAGOGASTRODUODENOSCOPY N/A 01/22/2017  ? Dr. Oneida Alar: Mild to moderate gastritis.  No H. pylori  ? ESOPHAGOGASTRODUODENOSCOPY N/A 05/31/2018  ? Procedure: ESOPHAGOGASTRODUODENOSCOPY (EGD);  Surgeon: Danie Binder, MD;  normal examined esophagus, gastritis due to ASA, and normal examined duodenum.  No obvious source for melena.   ? FRACTURE SURGERY    ? INTRAVASCULAR PRESSURE WIRE/FFR STUDY N/A 06/08/2017  ? Procedure: INTRAVASCULAR PRESSURE WIRE/FFR STUDY;  Surgeon: Nelva Bush, MD;  Location: [place] CV LAB;  Service: Cardiovascular;  Laterality: N/A;  ? LEFT HEART CATH AND  CORONARY ANGIOGRAPHY N/A 06/08/2017  ? Procedure: LEFT HEART CATH AND CORONARY ANGIOGRAPHY;  Surgeon: Nelva Bush, MD;  Location: [place] CV LAB;  Service: Cardiovascular;  Laterality: N/A;  ? LEFT HEART CATH AND CORONARY ANGIOGRAPHY N/A 06/05/2018  ? Procedure: LEFT HEART CATH AND CORONARY ANGIOGRAPHY;  Surgeon: Belva Crome, MD;  Location: [place] CV LAB;  Service: Cardiovascular;  Laterality: N/A;  ? LEFT HEART CATHETERIZATION WITH CORONARY ANGIOGRAM N/A 04/09/2014  ? Procedure: LEFT HEART CATHETERIZATION WITH  CORONARY ANGIOGRAM;  Surgeon: Leonie Man, MD;  Location: [facility] CATH LAB;  Service: Cardiovascular;  Laterality: N/A;  ? NASAL SEPTUM SURGERY  1999  ? POLYPECTOMY  01/22/2017  ? Procedure: POLYPECTOMY;  Surgeon: Danie Binder, MD;  Location: [facility] ENDO SU[facility];  Service: Endoscopy;;  right colon x4  ? POLYPECTOMY  03/02/2020  ? Procedure: POLYPECTOMY;  Surgeon: Eloise Harman, DO;  Location: [facility] END[facility];  Service: Endoscopy;;  ? TEE WITHOUT CARDIOVERSION N/A 06/07/2018  ? Procedure: TRANSESOPHAGEAL ECHOCARDIOGRAM (TEE);  Surgeon: Jolaine Artist, MD;  Location: [facility] ENDOSCOPY;  Service: Cardiovascular;  Laterality: N/A;  ? ? ?Current Medications: ?Current Meds  ?Medication Sig  ? albuterol (VENTOLIN HFA) 108 (90 Base) MCG/ACT inhaler Inhale into the lungs.  ? amoxicillin-clavulanate (AUGMENTIN) 875-125 MG tablet Take 1 tablet by mouth 2 (two) times daily.  ? apixaban (ELIQUIS) 5 MG TABS tablet Take 1 tablet (5 mg total) by mouth every 12 (twelve) hours.  ? atorvastatin (LIPITOR) 80 MG tablet Take 80 mg by mouth at bedtime.   ? benzonatate (TESSALON) 100 MG capsule Take 100 mg by mouth 3 (three) times daily as needed.  ? diphenhydramine-acetaminophen (TYLENOL PM) 25-500 MG TABS tablet Take 2 tablets by mouth at bedtime.   ? doxazosin (CARDURA) 8 MG tablet Take 8 mg by mouth at bedtime.   ? doxycycline (VIBRA-TABS) 100 MG tablet Take 100 mg by mouth 2 (two) times daily.  ? fluticasone (FLONASE) 50 MCG/ACT nasal spray Place 2 sprays into the nose daily as needed (allergies).  ? Hypromellose (ARTIFICIAL TEARS OP) Place 1-2 drops into both eyes daily as needed (for dry eyes).  ? lisinopril (PRINIVIL,ZESTRIL) 2.5 MG tablet TAKE 1 TABLET BY MOUTH EVERY DAY (Patient taking differently: Take 2.5 mg by mouth at bedtime.)  ? loratadine (CLARITIN) 10 MG tablet Take 10 mg by mouth daily.  ? metoprolol succinate (TOPROL-XL) 25 MG 24 hr tablet Take 25 mg by mouth daily.   ? nitroGLYCERIN (NITROSTAT) 0.4 MG SL tablet PLACE 1  TABLET (0.4 MG TOTAL) UNDER THE TONGUE EVERY 5 (FIVE) MINUTES AS NEEDED FOR CHEST PAIN.  ? pantoprazole (PROTONIX) 40 MG tablet TAKE 1 TABLET BY MOUTH DAILY  ? predniSONE (DELTASONE) 10 MG tablet Take by mouth.  ? Probiotic Product (PROBIOTIC PO) Take 1 capsule by mouth in the morning and at bedtime.  ? ranolazine (RANEXA) 500 MG 12 hr tablet Take 500 mg by mouth 2 (two) times daily.   ? senna (SENOKOT) 8.6 MG tablet Take 2 tablets by mouth at bedtime.  ? vitamin B-12 (CYANOCOBALAMIN) 1000 MCG tablet Take 1,000 mcg by mouth daily.  ?  ? ?Allergies:   Vancomycin  ? ?Social History  ? ?Socioeconomic History  ? Marital status: Married  ?  Spouse name: Not on file  ? Number of children: Not on file  ? Years of education: Not on file  ? Highest education level: Not on file  ?Occupational History  ?  Comment: Retired  ?Tobacco Use  ?  Smoking status: Former  ?  Packs/day: 1.50  ?  Years: 22.00  ?  Pack years: 33.00  ?  Types: Cigarettes  ?  Start date: 11/28/1942  ?  Quit date: 02/07/1964  ?  Years since quitting: 57.2  ? Smokeless tobacco: Never  ?Vaping Use  ? Vaping Use: Never used  ?Substance and Sexual Activity  ? Alcohol use: No  ?  Alcohol/week: 0.0 standard drinks  ? Drug use: No  ? Sexual activity: Yes  ?Other Topics Concern  ? Not on file  ?Social History Narrative  ? Not on file  ? ?Social Determinants of Health  ? ?Financial Resource Strain: Not on file  ?Food Insecurity: Not on file  ?Transportation Needs: Not on file  ?Physical Activity: Not on file  ?Stress: Not on file  ?Social Connections: Not on file  ?  ? ?Family History:  The patient's  family history includes Heart disease in his mother.  ? ?ROS:   ?Please see the history of present illness.    ?ROS All other systems reviewed and are negative. ? ? ?PHYSICAL EXAM:   ?VS:  BP (!) 150/90   Pulse 84   Ht '5\' 9"'$  (1.753 m)   Wt 162 lb 3.2 oz (73.6 kg)   SpO2 97%   BMI 23.95 kg/m?   ?Physical Exam  ?AVW:UJWJ, in no acute distress  ?Neck: no JVD, carotid  bruits, or masses ?Cardiac:RRR; no murmurs, rubs, or gallops  ?Respiratory:  rales and rhonchi through entire lung fields ?GI: soft, nontender, nondistended, + BS ?Ext: without cyanosis, clubbing, or edema, Good dis

## 2021-05-02 NOTE — Telephone Encounter (Signed)
I have message Dr.Branch regarding ED visit at [facility] ? ? ? ?We have opening today at 1:30 pm with M.Lenze,PA-C at [place] office, family agrees to visit. ? ? ? ?

## 2021-05-02 NOTE — Addendum Note (Signed)
Addended by: Christella Scheuermann C on: 05/02/2021 02:08 PM ? ? Modules accepted: Orders ? ?

## 2021-05-02 NOTE — Patient Instructions (Addendum)
Medication Instructions:  ?Start Lasix 20 mg tablets- take 2 tablets daily for 3 days, then decrease to one tablet daily ?Start Potassium 10 mEq tablets- take 2 tablets daily for 3 days, then decrease to one tablet daily ? ?Labwork: ?In 2 weeks:  ?-[facility] ? ?Testing/Procedures: ?Your physician has requested that you have an echocardiogram. Echocardiography is a painless test that uses sound waves to create images of your heart. It provides your doctor with information about the size and shape of your heart and how well your heart?s chambers and valves are working. This procedure takes approximately one hour. There are no restrictions for this procedure. ? ? ?Follow-Up: ?Follow up with Dr. Harl Bowie in 2-3 weeks- Ledell Noss or Gerrianne Scale, PA-C in [place]. ? ?Any Other Special Instructions Will Be Listed Below (If Applicable). ? ? ? ? ?If you need a refill on your cardiac medications before your next appointment, please call your pharmacy. ? ? ?Two Gram Sodium Diet 2000 mg ? ?What is Sodium? Sodium is a mineral found naturally in many foods. The most significant source of sodium in the diet is table salt, which is about 40% sodium.  Processed, convenience, and preserved foods also contain a large amount of sodium.  The body needs only 500 mg of sodium daily to function,  A normal diet provides more than enough sodium even if you do not use salt. ? ?Why Limit Sodium? A build up of sodium in the body can cause thirst, increased blood pressure, shortness of breath, and water retention.  Decreasing sodium in the diet can reduce edema and risk of heart attack or stroke associated with high blood pressure.  Keep in mind that there are many other factors involved in these health problems.  Heredity, obesity, lack of exercise, cigarette smoking, stress and what you eat all play a role. ? ?General Guidelines: ?Do not add salt at the table or in cooking.  One teaspoon of salt contains over 2 grams of sodium. ?Read food labels ?Avoid  processed and convenience foods ?Ask your dietitian before eating any foods not dicussed in the menu planning guidelines ?Consult your physician if you wish to use a salt substitute or a sodium containing medication such as antacids.  Limit milk and milk products to 16 oz (2 cups) per day. ? ?Shopping Hints: ?READ LABELS!! "Dietetic" does not necessarily mean low sodium. ?Salt and other sodium ingredients are often added to foods during processing. ? ? ? ?Menu Planning Guidelines ?Food Group Choose More Often Avoid  ?Beverages (see also the milk group All fruit juices, low-sodium, salt-free vegetables juices, low-sodium carbonated beverages Regular vegetable or tomato juices, commercially softened water used for drinking or cooking  ?Breads and Cereals Enriched white, wheat, rye and pumpernickel bread, hard rolls and dinner rolls; muffins, cornbread and waffles; most dry cereals, cooked cereal without added salt; unsalted crackers and breadsticks; low sodium or homemade bread crumbs Bread, rolls and crackers with salted tops; quick breads; instant hot cereals; pancakes; commercial bread stuffing; self-rising flower and biscuit mixes; regular bread crumbs or cracker crumbs  ?Desserts and Sweets Desserts and sweets mad with mild should be within allowance Instant pudding mixes and cake mixes  ?Fats Butter or margarine; vegetable oils; unsalted salad dressings, regular salad dressings limited to 1 Tbs; light, sour and heavy cream Regular salad dressings containing bacon fat, bacon bits, and salt pork; snack dips made with instant soup mixes or processed cheese; salted nuts  ?Fruits Most fresh, frozen and canned fruits Fruits processed  with salt or sodium-containing ingredient (some dried fruits are processed with sodium sulfites  ? ? ? ? ? ? ?Vegetables Fresh, frozen vegetables and low- sodium canned vegetables Regular canned vegetables, sauerkraut, pickled vegetables, and others prepared in brine; frozen vegetables in  sauces; vegetables seasoned with ham, bacon or salt pork  ?Condiments, Sauces, Miscellaneous ? Salt substitute with physician's approval; pepper, herbs, spices; vinegar, lemon or lime juice; hot pepper sauce; garlic powder, onion powder, low sodium soy sauce (1 Tbs.); low sodium condiments (ketchup, chili sauce, mustard) in limited amounts (1 tsp.) fresh ground horseradish; unsalted tortilla chips, pretzels, potato chips, popcorn, salsa (1/4 cup) Any seasoning made with salt including garlic salt, celery salt, onion salt, and seasoned salt; sea salt, rock salt, kosher salt; meat tenderizers; monosodium glutamate; mustard, regular soy sauce, barbecue, sauce, chili sauce, teriyaki sauce, steak sauce, Worcestershire sauce, and most flavored vinegars; canned gravy and mixes; regular condiments; salted snack foods, olives, picles, relish, horseradish sauce, catsup  ? ?Food preparation: Try these seasonings ?Meats:    ?Pork Sage, onion Serve with applesauce  ?Chicken Poultry seasoning, thyme, parsley Serve with cranberry sauce  ?Lamb Curry powder, rosemary, garlic, thyme Serve with mint sauce or jelly  ?Veal Marjoram, basil Serve with current jelly, cranberry sauce  ?Beef Pepper, bay leaf Serve with dry mustard, unsalted chive butter  ?[place] leaf, dill Serve with unsalted lemon butter, unsalted parsley butter  ?Vegetables:    ?Asparagus Lemon juice   ?Broccoli Lemon juice   ?Carrots Mustard dressing parsley, mint, nutmeg, glazed with unsalted butter and sugar   ?Green beans Marjoram, lemon juice, nutmeg,dill seed   ?Tomatoes Basil, marjoram, onion   ?Spice /blend for "Salt Shaker" 4 tsp ground thyme ?1 tsp ground sage 3 tsp ground rosemary ?4 tsp ground marjoram  ? ?Test your knowledge ?A product that says "Salt Free" may still contain sodium. True or False ?Garlic Powder and Hot Pepper Sauce an be used as alternative seasonings.True or False ?Processed foods have more sodium than fresh foods.  True or False ?Canned  Vegetables have less sodium than froze True or False ? ? ?WAYS TO DECREASE YOUR SODIUM INTAKE ?Avoid the use of added salt in cooking and at the table.  Table salt (and other prepared seasonings which contain salt) is probably one of the greatest sources of sodium in the diet.  Unsalted foods can gain flavor from the sweet, sour, and butter taste sensations of herbs and spices.  Instead of using salt for seasoning, try the following seasonings with the foods listed.  Remember: how you use them to enhance natural food flavors is limited only by your creativity... ?Allspice-Meat, fish, eggs, fruit, peas, red and yellow vegetables ?Almond Extract-Fruit baked goods ?Anise Seed-Sweet breads, fruit, carrots, beets, cottage cheese, cookies (tastes like licorice) ?Basil-Meat, fish, eggs, vegetables, rice, vegetables salads, soups, sauces ?Bay Leaf-Meat, fish, stews, poultry ?Burnet-Salad, vegetables (cucumber-like flavor) ?Caraway Seed-Bread, cookies, cottage cheese, meat, vegetables, cheese, rice ?Cardamon-Baked goods, fruit, soups ?Celery Powder or seed-Salads, salad dressings, sauces, meatloaf, soup, bread.Do not use  celery salt ?Chervil-Meats, salads, fish, eggs, vegetables, cottage cheese (parsley-like flavor) ?Chili Power-Meatloaf, chicken cheese, corn, eggplant, egg dishes ?Chives-Salads cottage cheese, egg dishes, soups, vegetables, sauces ?Cilantro-Salsa, casseroles ?Cinnamon-Baked goods, fruit, pork, lamb, chicken, carrots ?Cloves-Fruit, baked goods, fish, pot roast, green beans, beets, carrots ?Coriander-Pastry, cookies, meat, salads, cheese (lemon-orange flavor) ?Cumin-Meatloaf, fish,cheese, eggs, cabbage,fruit pie (caraway flavor) ?[employer], fruit, eggs, fish, poultry, cottage cheese, vegetables ?Dill Seed-Meat, cottage cheese, poultry, vegetables, fish, salads,  bread ?Fennel Seed-Bread, cookies, apples, pork, eggs, fish, beets, cabbage, cheese, Licorice-like flavor ?Garlic-(buds or powder) Salads,  meat, poultry, fish, bread, butter, vegetables, potatoes.Do not  use garlic salt ?Ginger-Fruit, vegetables, baked goods, meat, fish, poultry ?Horseradish Root-Meet, vegetables, butter ?Lemon Juice or Extract

## 2021-05-02 NOTE — Telephone Encounter (Signed)
Pt c/o of Chest Pain: STAT if CP now or developed within 24 hours ? ?1. Are you having CP right now? Not currently with patient ? ?2. Are you experiencing any other symptoms (ex. SOB, nausea, vomiting, sweating)? SOB ? ?3. How long have you been experiencing CP? Since yesterday ? ?4. Is your CP continuous or coming and going? Comes and goes when he coughs, SOB is constant ? ?5. Have you taken Nitroglycerin? no ? ?Patient's granddaughter states the patient went to see his PCP early last week and was told he had pneumonia.She says he started having chest pain yesterday and went to the ED. She says they did an xray and checked his bmp. She says his bmp came back 1700, but last time it was 539. She says he has also been coughing so they gave him lasix and it has helped. She says his is having the chest pain when he coughs. She says he is also having SOB, but is not currently with the patient. She says his oxygen was 94% yesterday all day and the ED said they found no pneumonia in lungs.  ?Phone: [phone] ? ??  ?

## 2021-05-03 DIAGNOSIS — J441 Chronic obstructive pulmonary disease with (acute) exacerbation: Secondary | ICD-10-CM | POA: Diagnosis not present

## 2021-05-03 DIAGNOSIS — I5032 Chronic diastolic (congestive) heart failure: Secondary | ICD-10-CM | POA: Diagnosis not present

## 2021-05-05 ENCOUNTER — Ambulatory Visit (INDEPENDENT_AMBULATORY_CARE_PROVIDER_SITE_OTHER): Payer: PPO

## 2021-05-05 DIAGNOSIS — I5033 Acute on chronic diastolic (congestive) heart failure: Secondary | ICD-10-CM

## 2021-05-05 LAB — ECHOCARDIOGRAM COMPLETE
AR max vel: 2.76 cm2
AV Area VTI: 2.7 cm2
AV Area mean vel: 2.45 cm2
AV Mean grad: 3 mmHg
AV Peak grad: 4.4 mmHg
AV Vena cont: 0.45 cm
Ao pk vel: 1.05 m/s
Calc EF: 64.5 %
MV M vel: 4.84 m/s
MV Peak grad: 93.7 mmHg
S' Lateral: 2.33 cm
Single Plane A2C EF: 63.1 %
Single Plane A4C EF: 65.6 %

## 2021-05-06 ENCOUNTER — Other Ambulatory Visit (HOSPITAL_COMMUNITY): Payer: PPO

## 2021-05-09 DIAGNOSIS — J441 Chronic obstructive pulmonary disease with (acute) exacerbation: Secondary | ICD-10-CM | POA: Diagnosis not present

## 2021-05-09 DIAGNOSIS — Z6824 Body mass index (BMI) 24.0-24.9, adult: Secondary | ICD-10-CM | POA: Diagnosis not present

## 2021-05-16 ENCOUNTER — Encounter: Payer: Self-pay | Admitting: Internal Medicine

## 2021-06-05 DIAGNOSIS — I482 Chronic atrial fibrillation, unspecified: Secondary | ICD-10-CM | POA: Diagnosis not present

## 2021-06-05 DIAGNOSIS — I10 Essential (primary) hypertension: Secondary | ICD-10-CM | POA: Diagnosis not present

## 2021-06-05 DIAGNOSIS — E7849 Other hyperlipidemia: Secondary | ICD-10-CM | POA: Diagnosis not present

## 2021-06-05 DIAGNOSIS — I25111 Atherosclerotic heart disease of native coronary artery with angina pectoris with documented spasm: Secondary | ICD-10-CM | POA: Diagnosis not present

## 2021-06-08 ENCOUNTER — Encounter: Payer: Self-pay | Admitting: Pulmonary Disease

## 2021-06-08 ENCOUNTER — Ambulatory Visit: Payer: PPO | Admitting: Pulmonary Disease

## 2021-06-08 VITALS — BP 146/86 | HR 88 | Temp 97.7°F | Ht 69.0 in | Wt 165.8 lb

## 2021-06-08 DIAGNOSIS — J40 Bronchitis, not specified as acute or chronic: Secondary | ICD-10-CM | POA: Diagnosis not present

## 2021-06-08 DIAGNOSIS — R0602 Shortness of breath: Secondary | ICD-10-CM | POA: Diagnosis not present

## 2021-06-08 DIAGNOSIS — J84112 Idiopathic pulmonary fibrosis: Secondary | ICD-10-CM

## 2021-06-08 NOTE — Assessment & Plan Note (Signed)
Current episode of acute bronchitis seems to be resolving after course of prednisone and antibiotics ?

## 2021-06-08 NOTE — Progress Notes (Signed)
? ?Subjective:  ? ? Patient ID: Eric Benitez, male    DOB: [birth year], 81 y.o.   MRN: [id number] ? ?HPI ? ?Chief Complaint  ?Patient presents with  ? Consult  ?  Consult following recent [place] Admission at [facility] for pneumonia.   ? 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 ?He reports dyspnea on exertion worsening for at least 1 to 2 years.  He now reports NYHA class III symptoms.  Reports an occasional dry cough.  Resting makes his breathing better ? ?He developed a chest cold in March 2023 and was started on antibiotics for pneumonia by PCP.  He had worsening symptoms of cough and shortness of breath and went to [facility] emergency room.  Labs reviewed potassium 3.4 creatinine 1.31 troponin negative D-dimer normal BNP 1798 AST, CXR coarse opacity bases. Given IV lasix 40 mg x1. Prednisone, tessalon doxy and augmentin. ? ?Review of his imaging studies show chronic interstitial infiltrates dating back to 2019 ? ? ?PMH - ?CAD s/p stent ?atrial flutter status post TEE DCCV 05/2018 on Eliquis ?Echo 04/2021 shows normal LVEF and mild AI ? ?Environment -they live in his 35[address] single-family home.  There was mold under the floorboards which was cleared in 2022. 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  Lives with his wife Eric Benitez ?Smoked in his 28s less than 10 pack years ? ? ?Significant tests/ events reviewed ? ?10/2015 PFTs moderate intraparenchymal restriction, ratio 89, FEV1 94%, FVC 76%, TLC 80%, DLCO 17.1/55%, corrects for alveolar volume ? ?CT abdomen/pelvis 06/26/2018 and 11/26/2019 shows peripheral fibrosis at the lung bases ? ?Chest x-ray 05/2017 shows bilateral interstitial chronic infiltrates ? ?Past Medical History:  ?Diagnosis Date  ? Arthritis   ? "neck, hands, fingers" (04/09/2014)  ? Bleeds easily ([place])   ? CAD (coronary artery disease)   ? a. s/p prior stenting of LAD b.  patent stent by cath in  2016 and 06/2017 --> residual 60% D1 stenosis and moderate disease along the RCA  ? COPD (chronic obstructive pulmonary disease) ([place])   ? GERD (gastroesophageal reflux disease)   ? Hyperlipemia   ? Hypertension   ? Kidney stones   ? "I've had several; had to go in and get them once" (04/09/2014)  ? Pneumonia 1940's  ? Sleep apnea   ? ?Past Surgical History:  ?Procedure Laterality Date  ? ANKLE FRACTURE SURGERY Right 2009  ? CARDIAC CATHETERIZATION  1990's X 2;  04/09/2014  ? CARDIOVERSION N/A 06/07/2018  ? Procedure: CARDIOVERSION;  Surgeon: Jolaine Artist, MD;  Location: [facility] ENDOSCOPY;  Service: Cardiovascular;  Laterality: N/A;  ? COLONOSCOPY N/A 01/22/2017  ? Dr. Oneida Alar: 6 tubular adenomas removed, external hemorrhoids.  Recommended next exam in 3 years.  ? COLONOSCOPY WITH PROPOFOL N/A 03/02/2020  ? Surgeon: Eloise Harman, DO; Nonbleeding internal hemorrhoids, multiple diverticula in the sigmoid, descending, transverse colon, 2 polyps 4 to 6 mm in size and one 2 mm polyp.  Pathology with tubular adenoma and hyperplastic polyp. Recommended repeat in 5 years.  ? CORONARY ANGIOPLASTY WITH STENT PLACEMENT  2006  ? "2"  ? CYSTOSCOPY W/ STONE MANIPULATION  ~ 2008  ? ESOPHAGOGASTRODUODENOSCOPY N/A 01/22/2017  ? Dr. Oneida Alar: Mild to moderate gastritis.  No H. pylori  ? ESOPHAGOGASTRODUODENOSCOPY N/A 05/31/2018  ? Procedure: ESOPHAGOGASTRODUODENOSCOPY (EGD);  Surgeon: Danie Binder, MD;  normal examined esophagus, gastritis due to ASA, and normal examined  duodenum.  No obvious source for melena.   ? FRACTURE SURGERY    ? INTRAVASCULAR PRESSURE WIRE/FFR STUDY N/A 06/08/2017  ? Procedure: INTRAVASCULAR PRESSURE WIRE/FFR STUDY;  Surgeon: Nelva Bush, MD;  Location: [place] CV LAB;  Service: Cardiovascular;  Laterality: N/A;  ? LEFT HEART CATH AND CORONARY ANGIOGRAPHY N/A 06/08/2017  ? Procedure: LEFT HEART CATH AND CORONARY ANGIOGRAPHY;  Surgeon: Nelva Bush, MD;  Location: [place] CV LAB;  Service:  Cardiovascular;  Laterality: N/A;  ? LEFT HEART CATH AND CORONARY ANGIOGRAPHY N/A 06/05/2018  ? Procedure: LEFT HEART CATH AND CORONARY ANGIOGRAPHY;  Surgeon: Belva Crome, MD;  Location: [place] CV LAB;  Service: Cardiovascular;  Laterality: N/A;  ? LEFT HEART CATHETERIZATION WITH CORONARY ANGIOGRAM N/A 04/09/2014  ? Procedure: LEFT HEART CATHETERIZATION WITH CORONARY ANGIOGRAM;  Surgeon: Leonie Man, MD;  Location: [facility] CATH LAB;  Service: Cardiovascular;  Laterality: N/A;  ? NASAL SEPTUM SURGERY  1999  ? POLYPECTOMY  01/22/2017  ? Procedure: POLYPECTOMY;  Surgeon: Danie Binder, MD;  Location: [facility] ENDO SU[facility];  Service: Endoscopy;;  right colon x4  ? POLYPECTOMY  03/02/2020  ? Procedure: POLYPECTOMY;  Surgeon: Eloise Harman, DO;  Location: [facility] ENDO SUITE;  Service: Endoscopy;;  ? TEE WITHOUT CARDIOVERSION N/A 06/07/2018  ? Procedure: TRANSESOPHAGEAL ECHOCARDIOGRAM (TEE);  Surgeon: Jolaine Artist, MD;  Location: [facility] ENDOSCOPY;  Service: Cardiovascular;  Laterality: N/A;  ? ? ?Allergies  ?Allergen Reactions  ? Vancomycin Rash  ? ? ?Social History  ? ?Socioeconomic History  ? Marital status: Married  ?  Spouse name: Not on file  ? Number of children: Not on file  ? Years of education: Not on file  ? Highest education level: Not on file  ?Occupational History  ?  Comment: Retired  ?Tobacco Use  ? Smoking status: Former  ?  Packs/day: 1.50  ?  Years: 22.00  ?  Pack years: 33.00  ?  Types: Cigarettes  ?  Start date: 11/28/1942  ?  Quit date: 02/07/1964  ?  Years since quitting: 57.3  ? Smokeless tobacco: Never  ?Vaping Use  ? Vaping Use: Never used  ?Substance and Sexual Activity  ? Alcohol use: No  ?  Alcohol/week: 0.0 standard drinks  ? Drug use: No  ? Sexual activity: Yes  ?Other Topics Concern  ? Not on file  ?Social History Narrative  ? Not on file  ? ?Social Determinants of Health  ? ?Financial Resource Strain: Not on file  ?Food Insecurity: Not on file  ?Transportation Needs: Not on file  ?Physical  Activity: Not on file  ?Stress: Not on file  ?Social Connections: Not on file  ?Intimate Partner Violence: Not on file  ? ? ? ?Family History  ?Problem Relation Age of Onset  ? Heart disease Mother   ? Colon cancer Neg Hx   ? Gastric cancer Neg Hx   ? Esophageal cancer Neg Hx   ? ? ? ?Review of Systems ?Constitutional: negative for anorexia, fevers and sweats  ?Eyes: negative for irritation, redness and visual disturbance  ?Ears, nose, mouth, throat, and face: negative for earaches, epistaxis, nasal congestion and sore throat  ?Cardiovascular: negative for chest pain, lower extremity edema, orthopnea, palpitations and syncope  ?Gastrointestinal: negative for abdominal pain, constipation, diarrhea, melena, nausea and vomiting  ?Genitourinary:negative for dysuria, frequency and hematuria  ?Hematologic/lymphatic: negative for bleeding, easy bruising and lymphadenopathy  ?Musculoskeletal:negative for arthralgias, muscle weakness and stiff joints  ?Neurological: negative for coordination problems, gait  problems, headaches and weakness  ?Endocrine: negative for diabetic symptoms including polydipsia, polyuria and weight loss ? ?   ?Objective:  ? Physical Exam ? ?Gen. Pleasant, well-nourished, in no distress, normal affect ?ENT - no pallor,icterus, no post nasal drip ?Neck: No JVD, no thyromegaly, no carotid bruits ?Lungs: no use of accessory muscles, no dullness to percussion,bibasal dry 1/3 rales ?Cardiovascular: Rhythm regular, heart sounds  normal, no murmurs or gallops, no peripheral edema ?Abdomen: soft and non-tender, no hepatosplenomegaly, BS normal. ?Musculoskeletal: No deformities, no cyanosis or clubbing ?Neuro:  alert, non focal ? ? ? ?   ?Assessment & Plan:  ? ? ?

## 2021-06-08 NOTE — Patient Instructions (Signed)
? ?  Ambulatory sat ?HRCT chest to r/o ILD ?Schedule pFTs ? ?Blood work today , CMET , ESR, ANA, CCP ?

## 2021-06-08 NOTE — Assessment & Plan Note (Addendum)
He seems to have ILD that dates back to 2019.  This is also well visualized on his prior CT abdomen.  There is no clear occupational exposure.  He reports mold at home which was cleared in 2022.  Hypersensitivity pneumonitis in the differential but in this age group I am primarily worried about idiopathic pulmonary fibrosis. ?We will obtain high-resolution CT chest to clarify but worsening dyspnea on exertion over the last 2 years correlates.  We will also obtain PFTs to look for worsening. ?He does not have any stigmata of collagen vascular disease but we will obtain basic blood work for inflammatory causes. ? ?I briefly introduced the concept of pulmonary fibrosis as a progressive disease.  Depending on the above work-up we will discuss more about medications on his next visit ? ?Does not desaturate on exertion today which is reassuring.  We will schedule PFTs ?

## 2021-06-10 LAB — COMPREHENSIVE METABOLIC PANEL
ALT: 20 IU/L (ref 0–44)
AST: 19 IU/L (ref 0–40)
Albumin/Globulin Ratio: 1.6 (ref 1.2–2.2)
Albumin: 4.2 g/dL (ref 3.7–4.7)
Alkaline Phosphatase: 77 IU/L (ref 44–121)
BUN/Creatinine Ratio: 11 (ref 10–24)
BUN: 11 mg/dL (ref 8–27)
Bilirubin Total: 0.6 mg/dL (ref 0.0–1.2)
CO2: 23 mmol/L (ref 20–29)
Calcium: 9.1 mg/dL (ref 8.6–10.2)
Chloride: 102 mmol/L (ref 96–106)
Creatinine, Ser: 1.01 mg/dL (ref 0.76–1.27)
Globulin, Total: 2.7 g/dL (ref 1.5–4.5)
Glucose: 97 mg/dL (ref 70–99)
Potassium: 4.8 mmol/L (ref 3.5–5.2)
Sodium: 138 mmol/L (ref 134–144)
Total Protein: 6.9 g/dL (ref 6.0–8.5)
eGFR: 75 mL/min/{1.73_m2} (ref >59)

## 2021-06-10 LAB — CYCLIC CITRUL PEPTIDE ANTIBODY, IGG/IGA: Cyclic Citrullin Peptide Ab: 1 units (ref 0–19)

## 2021-06-10 LAB — SEDIMENTATION RATE: Sed Rate: 11 mm/hr (ref 0–30)

## 2021-06-10 LAB — ANA: Anti Nuclear Antibody (ANA): NEGATIVE

## 2021-07-06 ENCOUNTER — Ambulatory Visit (HOSPITAL_COMMUNITY)
Admission: RE | Admit: 2021-07-06 | Discharge: 2021-07-06 | Disposition: A | Discharge status: Home / Self Care | Payer: PPO | Source: Ambulatory Visit | Attending: Pulmonary Disease | Admitting: Pulmonary Disease

## 2021-07-06 DIAGNOSIS — J84112 Idiopathic pulmonary fibrosis: Secondary | ICD-10-CM | POA: Insufficient documentation

## 2021-07-06 DIAGNOSIS — E7849 Other hyperlipidemia: Secondary | ICD-10-CM | POA: Diagnosis not present

## 2021-07-06 DIAGNOSIS — I482 Chronic atrial fibrillation, unspecified: Secondary | ICD-10-CM | POA: Diagnosis not present

## 2021-07-06 DIAGNOSIS — I700 Atherosclerosis of aorta: Secondary | ICD-10-CM | POA: Diagnosis not present

## 2021-07-06 DIAGNOSIS — I25111 Atherosclerotic heart disease of native coronary artery with angina pectoris with documented spasm: Secondary | ICD-10-CM | POA: Diagnosis not present

## 2021-07-06 DIAGNOSIS — I712 Thoracic aortic aneurysm, without rupture, unspecified: Secondary | ICD-10-CM | POA: Diagnosis not present

## 2021-07-06 DIAGNOSIS — I10 Essential (primary) hypertension: Secondary | ICD-10-CM | POA: Diagnosis not present

## 2021-07-06 DIAGNOSIS — I2510 Atherosclerotic heart disease of native coronary artery without angina pectoris: Secondary | ICD-10-CM | POA: Diagnosis not present

## 2021-07-06 DIAGNOSIS — J479 Bronchiectasis, uncomplicated: Secondary | ICD-10-CM | POA: Diagnosis not present

## 2021-07-24 ENCOUNTER — Other Ambulatory Visit: Payer: Self-pay | Admitting: Physician Assistant

## 2021-07-29 ENCOUNTER — Ambulatory Visit (INDEPENDENT_AMBULATORY_CARE_PROVIDER_SITE_OTHER): Payer: PPO | Admitting: Pulmonary Disease

## 2021-07-29 DIAGNOSIS — J84112 Idiopathic pulmonary fibrosis: Secondary | ICD-10-CM

## 2021-07-29 DIAGNOSIS — R0602 Shortness of breath: Secondary | ICD-10-CM | POA: Diagnosis not present

## 2021-07-29 LAB — PULMONARY FUNCTION TEST
DL/VA % pred: 87 %
DL/VA: 3.4 ml/min/mmHg/L
DLCO cor % pred: 60 %
DLCO cor: 14.42 ml/min/mmHg
DLCO unc % pred: 60 %
DLCO unc: 14.42 ml/min/mmHg
FEF 25-75 Post: 4.8 L/sec
FEF 25-75 Pre: 1.65 L/sec
FEF2575-%Change-Post: 190 %
FEF2575-%Pred-Post: 254 %
FEF2575-%Pred-Pre: 87 %
FEV1-%Change-Post: 19 %
FEV1-%Pred-Post: 87 %
FEV1-%Pred-Pre: 73 %
FEV1-Post: 2.41 L
FEV1-Pre: 2.01 L
FEV1FVC-%Change-Post: 18 %
FEV1FVC-%Pred-Pre: 107 %
FEV6-%Change-Post: 1 %
FEV6-%Pred-Post: 73 %
FEV6-%Pred-Pre: 72 %
FEV6-Post: 2.66 L
FEV6-Pre: 2.62 L
FEV6FVC-%Pred-Post: 107 %
FEV6FVC-%Pred-Pre: 107 %
FVC-%Change-Post: 1 %
FVC-%Pred-Post: 68 %
FVC-%Pred-Pre: 67 %
FVC-Post: 2.66 L
FVC-Pre: 2.62 L
Post FEV1/FVC ratio: 91 %
Post FEV6/FVC ratio: 100 %
Pre FEV1/FVC ratio: 77 %
Pre FEV6/FVC Ratio: 100 %
RV % pred: 71 %
RV: 1.85 L
TLC % pred: 66 %
TLC: 4.54 L

## 2021-07-29 NOTE — Progress Notes (Signed)
Full PFT performed today. °

## 2021-08-02 ENCOUNTER — Encounter: Payer: Self-pay | Admitting: Pulmonary Disease

## 2021-08-02 ENCOUNTER — Telehealth: Payer: Self-pay | Admitting: Pharmacist

## 2021-08-02 ENCOUNTER — Ambulatory Visit: Payer: PPO | Admitting: Pulmonary Disease

## 2021-08-02 DIAGNOSIS — J84112 Idiopathic pulmonary fibrosis: Secondary | ICD-10-CM | POA: Diagnosis not present

## 2021-08-02 NOTE — Assessment & Plan Note (Addendum)
HRCT is diagnostic of UIP.  Serology is negative.  There has been progression with about 10% drop in lung function over the past few years.  This is consistent with his symptoms.  The above constellation and 81 year old man would be consistent with a diagnosis of idiopathic pulmonary fibrosis. We discussed implications and prognosis for this condition and natural history of the patient and his daughter. I discussed treatment options including pirfenidone and nintedanib.  He has some baseline constipation so he was willing to trial ofev.  He is on Lipitor 80 so we will have to monitor LFTs closely.  We will check LFTs 1 month after starting

## 2021-08-03 ENCOUNTER — Other Ambulatory Visit (HOSPITAL_COMMUNITY): Payer: Self-pay

## 2021-08-03 NOTE — Telephone Encounter (Signed)
ATC patient.  Per Arvilla Market with Pharmacy team, patient states he did not go home with patient assistance application for Ofev medication.  Left vm letting patient know we needed to get the forms to him and asked him to call back. Will confirm when he returns call if we should either mail it or if he will come pick it up.

## 2021-08-03 NOTE — Telephone Encounter (Signed)
Received notification from  [place]  regarding a prior authorization for Eric Benitez. Authorization has been APPROVED from 08/02/21 to 08/03/22.   Per test claim, copay for 30 days supply is $2215.97   Authorization # [id number] (per fax) or # [id number] (per CMM)  Called patient - he did not complete any patient assistance paperwork at [place] or take any forms home from [place]. He states he would like application mailed. Placed in mail today and patient advised he can drop off at [place] location with income documents once completed. Provider portion is filed in PAP Scientist, clinical (histocompatibility and immunogenetics) in pharmacy office  Knox Saliva, PharmD, MPH, BCPS, CPP Clinical Pharmacist (Rheumatology and Pulmonology)

## 2021-08-03 NOTE — Telephone Encounter (Signed)
Pharmacy team has mailed application to patient. Will wait for patient to return to [facility] office.

## 2021-08-11 NOTE — Telephone Encounter (Signed)
Received patient forms for [employer] Ofev application hwoever income documents were not droped off. I spoke with patient today and he states he will drop off income documents for both he and his wife tomorrow and will have them fax to pharmacy team again  Application is placed in PAP pending info folder for now.  Knox Saliva, PharmD, MPH, BCPS, CPP Clinical Pharmacist (Rheumatology and Pulmonology)

## 2021-08-11 NOTE — Telephone Encounter (Signed)
Patient brought in assistance forms to [place] office.  Will give to [place] and pt requesting to pick up forms.  Please advise.

## 2021-08-11 NOTE — Telephone Encounter (Signed)
Forms faxed to [phone].  Will route to [place] and [facility] so they are aware.

## 2021-08-12 ENCOUNTER — Other Ambulatory Visit (HOSPITAL_COMMUNITY): Payer: Self-pay

## 2021-08-12 NOTE — Telephone Encounter (Signed)
Patient brought in proof of income from [employer] for him and his wife.  Gave copies to [employer] and she will fax to [facility] in [place].

## 2021-08-12 NOTE — Telephone Encounter (Signed)
Submitted Patient Assistance Application to [employer] for OFEV along with provider portion, PA and income documents. Will update patient when we receive a response.  Fax# [phone] Phone# [phone] 

## 2021-08-12 NOTE — Telephone Encounter (Signed)
Papers faxed to [phone]. [place] routing to you so you're aware! Thanks!

## 2021-08-19 DIAGNOSIS — I5032 Chronic diastolic (congestive) heart failure: Secondary | ICD-10-CM | POA: Diagnosis not present

## 2021-08-19 DIAGNOSIS — K2100 Gastro-esophageal reflux disease with esophagitis, without bleeding: Secondary | ICD-10-CM | POA: Diagnosis not present

## 2021-08-19 DIAGNOSIS — D649 Anemia, unspecified: Secondary | ICD-10-CM | POA: Diagnosis not present

## 2021-08-19 DIAGNOSIS — E7849 Other hyperlipidemia: Secondary | ICD-10-CM | POA: Diagnosis not present

## 2021-08-19 DIAGNOSIS — E782 Mixed hyperlipidemia: Secondary | ICD-10-CM | POA: Diagnosis not present

## 2021-08-19 DIAGNOSIS — I10 Essential (primary) hypertension: Secondary | ICD-10-CM | POA: Diagnosis not present

## 2021-08-19 DIAGNOSIS — E7800 Pure hypercholesterolemia, unspecified: Secondary | ICD-10-CM | POA: Diagnosis not present

## 2021-08-19 DIAGNOSIS — R5381 Other malaise: Secondary | ICD-10-CM | POA: Diagnosis not present

## 2021-08-19 DIAGNOSIS — E7801 Familial hypercholesterolemia: Secondary | ICD-10-CM | POA: Diagnosis not present

## 2021-08-25 NOTE — Telephone Encounter (Signed)
Called [employer] for update on patient's Ofev application. Per rep, page 3 came in sideways and needs to be refaxed. They received a second fax with all documents needed.  Received a verbal confirmation from  [place] regarding an approval for OFEV patient assistance from 08/25/21 to 02/05/22.   Phone# [phone], option 0  Knox Saliva, PharmD, MPH, BCPS, CPP Clinical Pharmacist (Rheumatology and Pulmonology)

## 2021-08-29 ENCOUNTER — Telehealth: Payer: Self-pay | Admitting: Pharmacist

## 2021-08-29 DIAGNOSIS — I482 Chronic atrial fibrillation, unspecified: Secondary | ICD-10-CM | POA: Diagnosis not present

## 2021-08-29 DIAGNOSIS — D6869 Other thrombophilia: Secondary | ICD-10-CM | POA: Diagnosis not present

## 2021-08-29 DIAGNOSIS — N401 Enlarged prostate with lower urinary tract symptoms: Secondary | ICD-10-CM | POA: Diagnosis not present

## 2021-08-29 DIAGNOSIS — Z0001 Encounter for general adult medical examination with abnormal findings: Secondary | ICD-10-CM | POA: Diagnosis not present

## 2021-08-29 DIAGNOSIS — Z6825 Body mass index (BMI) 25.0-25.9, adult: Secondary | ICD-10-CM | POA: Diagnosis not present

## 2021-08-29 DIAGNOSIS — E7849 Other hyperlipidemia: Secondary | ICD-10-CM | POA: Diagnosis not present

## 2021-08-29 DIAGNOSIS — J84112 Idiopathic pulmonary fibrosis: Secondary | ICD-10-CM

## 2021-08-29 DIAGNOSIS — I10 Essential (primary) hypertension: Secondary | ICD-10-CM | POA: Diagnosis not present

## 2021-08-29 DIAGNOSIS — D649 Anemia, unspecified: Secondary | ICD-10-CM | POA: Diagnosis not present

## 2021-08-29 DIAGNOSIS — I25119 Atherosclerotic heart disease of native coronary artery with unspecified angina pectoris: Secondary | ICD-10-CM | POA: Diagnosis not present

## 2021-08-29 DIAGNOSIS — K2100 Gastro-esophageal reflux disease with esophagitis, without bleeding: Secondary | ICD-10-CM | POA: Diagnosis not present

## 2021-08-29 DIAGNOSIS — Z5181 Encounter for therapeutic drug level monitoring: Secondary | ICD-10-CM

## 2021-08-29 MED ORDER — OFEV 150 MG PO CAPS: 150.0000 mg | ORAL_CAPSULE | Freq: Two times a day (BID) | ORAL | 5 refills | Status: DC

## 2021-08-29 NOTE — Telephone Encounter (Signed)
Subjective:  Patient called today by [facility] pharmacy team for Ofev new start counseling.   Patient was last seen by Dr. Elsworth Soho on 08/02/21.  Pertinent past medical history includes IPF, asthma, CAD, history of unstable angina (on chronic anticoagulation), PUD, GERD, hyperlipidemia.  He is naive to antifibrotics.cm  History of CAD: Yes History of MI: No Current anticoagulant use: Yes - Elquis 44m twice daily History of HTN: Yes  History of elevated LFTs: No History of diarrhea, nausea, vomiting: No  Objective: Allergies  Allergen Reactions   Vancomycin Rash    Outpatient Encounter Medications as of 08/29/2021  Medication Sig   albuterol (VENTOLIN HFA) 108 (90 Base) MCG/ACT inhaler Inhale into the lungs.   apixaban (ELIQUIS) 5 MG TABS tablet Take 1 tablet (5 mg total) by mouth every 12 (twelve) hours.   atorvastatin (LIPITOR) 80 MG tablet Take 80 mg by mouth at bedtime.    benzonatate (TESSALON) 100 MG capsule Take 100 mg by mouth 3 (three) times daily as needed.   Budeson-Glycopyrrol-Formoterol (BREZTRI AEROSPHERE) 160-9-4.8 MCG/ACT AERO Inhale into the lungs.   diphenhydramine-acetaminophen (TYLENOL PM) 25-500 MG TABS tablet Take 2 tablets by mouth at bedtime.    doxazosin (CARDURA) 8 MG tablet Take 8 mg by mouth at bedtime.    fluticasone (FLONASE) 50 MCG/ACT nasal spray Place 2 sprays into the nose daily as needed (allergies).   furosemide (LASIX) 20 MG tablet Take 2 tablets (40 mg total) by mouth daily for 3 days, THEN 1 tablet (20 mg total) daily.   Hypromellose (ARTIFICIAL TEARS OP) Place 1-2 drops into both eyes daily as needed (for dry eyes).   loratadine (CLARITIN) 10 MG tablet Take 10 mg by mouth daily.   metoprolol succinate (TOPROL-XL) 25 MG 24 hr tablet Take 25 mg by mouth daily.    nitroGLYCERIN (NITROSTAT) 0.4 MG SL tablet PLACE 1 TABLET (0.4 MG TOTAL) UNDER THE TONGUE EVERY 5 (FIVE) MINUTES AS NEEDED FOR CHEST PAIN.   pantoprazole (PROTONIX) 40 MG tablet TAKE  1 TABLET BY MOUTH DAILY   potassium chloride (KLOR-CON) 10 MEQ tablet Take 1 tablet (10 mEq total) by mouth daily.   Probiotic Product (PROBIOTIC PO) Take 1 capsule by mouth in the morning and at bedtime.   ranolazine (RANEXA) 500 MG 12 hr tablet Take 500 mg by mouth 2 (two) times daily.    senna (SENOKOT) 8.6 MG tablet Take 2 tablets by mouth at bedtime.   vitamin B-12 (CYANOCOBALAMIN) 1000 MCG tablet Take 1,000 mcg by mouth daily.   No facility-administered encounter medications on file as of 08/29/2021.     Immunization History  Administered Date(s) Administered   Influenza-Unspecified 11/06/2013, 11/19/2015   Pneumococcal Polysaccharide-23 07/07/2014   Pneumococcal-Unspecified 03/09/2013   Tdap 07/12/2015      PFT's TLC  Date Value Ref Range Status  07/29/2021 4.54 L Final      CMP     Component Value Date/Time   NA 138 06/08/2021 0940   K 4.8 06/08/2021 0940   CL 102 06/08/2021 0940   CO2 23 06/08/2021 0940   GLUCOSE 97 06/08/2021 0940   GLUCOSE 113 (H) 06/08/2018 2108   BUN 11 06/08/2021 0940   CREATININE 1.01 06/08/2021 0940   CALCIUM 9.1 06/08/2021 0940   PROT 6.9 06/08/2021 0940   ALBUMIN 4.2 06/08/2021 0940   AST 19 06/08/2021 0940   ALT 20 06/08/2021 0940   ALKPHOS 77 06/08/2021 0940   BILITOT 0.6 06/08/2021 0940   GFRNONAA >60 06/08/2018 2108  GFRAA >60 06/08/2018 2108    CBC    Component Value Date/Time   WBC 6.2 06/08/2018 2108   RBC 4.09 (L) 06/08/2018 2108   HGB 12.9 (L) 06/08/2018 2108   HCT 38.8 (L) 06/08/2018 2108   PLT 156 06/08/2018 2108   MCV 94.9 06/08/2018 2108   MCH 31.5 06/08/2018 2108   MCHC 33.2 06/08/2018 2108   RDW 13.4 06/08/2018 2108   LYMPHSABS 2.1 06/08/2018 2108   MONOABS 0.9 06/08/2018 2108   EOSABS 0.2 06/08/2018 2108   BASOSABS 0.0 06/08/2018 2108    LFT's    Latest Ref Rng & Units 06/08/2021    9:40 AM 06/08/2018    9:08 PM 06/03/2018    1:44 PM  Hepatic Function  Total Protein 6.0 - 8.5 g/dL 6.9  6.7  7.5    Albumin 3.7 - 4.7 g/dL 4.2  3.8  3.9   AST 0 - 40 IU/L 19  31  24   ALT 0 - 44 IU/L 20  30  26   Alk Phosphatase 44 - 121 IU/L 77  75  67   Total Bilirubin 0.0 - 1.2 mg/dL 0.6  0.1  0.7     Patient states he had labs drawn this morning with PCP and everything was wnl.  HRCT (07/07/21) - appearance of the lungs is considered diagnostic of usual interstitial pneumonia (UIP) per current ATS guidelines  Assessment and Plan  Ofev Medication Management Thoroughly counseled patient on the efficacy, mechanism of action, dosing, administration, adverse effects, and monitoring parameters of Ofev. Patient verbalized understanding.   Goals of Therapy: Will not stop or reverse the progression of ILD. It will slow the progression of ILD.  Inhibits tyrosine kinase inhibitors which slow the fibrosis/progression of ILD -Significant reduction in the rate of disease progression was observed after treatment (61.1% [before] vs 33.3% [after], P?=?0.008) over 42 weeks.  Dosing: 150 mg (one capsule) by mouth twice daily (approx 12 hours apart). Discussed taking with food approximately 12 hours apart. Discussed that capsule should not be crushed or split.  Adverse Effects: Nausea, vomiting, diarrhea (2 in 3 patients) appetite loss, weight loss - management of diarrhea with loperamide discussed including max use of 48 hours and max of 8 capsules per day. Abdominal pain (up to 1 in 5 patients) Nasopharyngitis (13%), UTI (6%) Risk of thrombosis (3%) and acute MI (2%) Hypertension (5%) Dizziness Fatigue (10%)  Monitoring: Monitor for diarrhea, nausea and vomiting, GI perforation, hepatotoxicity  Monitor LFTs - baseline, monthly for first 6 months, then every 3 months routinely  Access: Approval of Ofev through: patient assistance Rx sent to: [employer] for Ofev: [phone]  Medication Reconciliation A drug regimen assessment was performed, including review of allergies, interactions, disease-state  management, dosing and immunization history. Medications were reviewed with the patient, including name, instructions, indication, goals of therapy, potential side effects, importance of adherence, and safe use.  Anticoagulant use: Yes  This appointment required 20 minutes of patient care (this includes precharting, chart review, review of results, face-to-face care, etc.).  Thank you for involving pharmacy to assist in providing this patient's care.    , PharmD, MPH, BCPS, CPP Clinical Pharmacist (Rheumatology and Pulmonology) 

## 2021-09-28 DIAGNOSIS — Z6824 Body mass index (BMI) 24.0-24.9, adult: Secondary | ICD-10-CM | POA: Diagnosis not present

## 2021-09-28 DIAGNOSIS — R030 Elevated blood-pressure reading, without diagnosis of hypertension: Secondary | ICD-10-CM | POA: Diagnosis not present

## 2021-09-28 DIAGNOSIS — S51811A Laceration without foreign body of right forearm, initial encounter: Secondary | ICD-10-CM | POA: Diagnosis not present

## 2021-09-29 ENCOUNTER — Ambulatory Visit: Payer: PPO | Admitting: Pulmonary Disease

## 2021-09-29 ENCOUNTER — Encounter: Payer: Self-pay | Admitting: Pulmonary Disease

## 2021-09-29 DIAGNOSIS — J84112 Idiopathic pulmonary fibrosis: Secondary | ICD-10-CM | POA: Diagnosis not present

## 2021-09-29 NOTE — Patient Instructions (Addendum)
   OK to stop breztri OK to use albuterol as needed for shortness of breath every 6h   STop ofev We discussed alternative medication called pirfenidone and side effect profile  Make appointment with cardiology please

## 2021-09-29 NOTE — Assessment & Plan Note (Signed)
Unfortunately he was unable to tolerate Ofev and had several side effects, does not want to resume. We discussed alternative medication called pirfenidone and side effect profile.  He would rather take a break. I do not feel Judithann Sauger is helping him and he can discontinue this. He can use albuterol on an as-needed basis. We will reassess in 3 to 4 months and see if he is ready to restart another anti fibrotic

## 2021-09-29 NOTE — Progress Notes (Signed)
   Subjective:    Patient ID: Eric Benitez, male    DOB: 15-Nov-1940, 81 y.o.   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  There was mold under the floorboards which was cleared in 2022. 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  Lives with his wife Eric Benitez in his 54s less than 10 pack years  Chief Complaint  Patient presents with   Follow-up    Stopped Ofev    He started Ofev a few weeks ago and within 2 weeks he developed diarrhea, chest pain and neck pain and stopped taking this medication.  He does not want to start again He complains of hoarseness of voice, stopped taking Breztri and voice improved Dyspnea is otherwise stable. His neck pain is persistent and previously this had improved after he had cardiac stents placed. I reviewed cardiology consultation from 04/2021 Accompanied by his wife and daughter who corroborates history  Significant tests/ events reviewed  HRCT 06/2021 UIP pattern 06/2021 ANA, CCP neg   07/2021 PFTs ratio 77, FVC 68%, TLC 66%, DLCO 14.40/60%   10/2015 PFTs moderate intraparenchymal restriction, ratio 89, FEV1 94%, FVC 76%, TLC 80%, DLCO 17.1/55%, corrects for alveolar volume   CT abdomen/pelvis 06/26/2018 and 11/26/2019 shows peripheral fibrosis at the lung bases   Chest x-ray 05/2017 shows bilateral interstitial chronic infiltrates   Review of Systems neg for any significant sore throat, dysphagia, itching, sneezing, nasal congestion or excess/ purulent secretions, fever,  chills, sweats, unintended wt loss, pleuritic or exertional cp, hempoptysis, orthopnea pnd or change in chronic leg swelling. Also denies presyncope, palpitations, heartburn, abdominal pain, nausea, vomiting, diarrhea or change in bowel or urinary habits, dysuria,hematuria, rash, arthralgias, visual complaints, headache, numbness weakness or ataxia.     Objective:   Physical Exam  Gen. Pleasant, well-nourished, in no distress ENT - no thrush, no pallor/icterus,no post nasal drip Neck: No JVD, no thyromegaly, no carotid bruits Lungs: no use of accessory muscles, no dullness to percussion,  Bibasal dry crackles Cardiovascular: Rhythm regular, heart sounds  normal, no murmurs or gallops, no peripheral edema Musculoskeletal: No deformities, no cyanosis or clubbing  Neuro -brisk bilateral reflexes       Assessment & Plan:   His chest pain is resolved. With his persistent neck pain he is concerned about cardiac etiology and I will ask him to make appointment with cardiology

## 2021-09-30 ENCOUNTER — Encounter: Payer: Self-pay | Admitting: Cardiology

## 2021-09-30 NOTE — Telephone Encounter (Signed)
Error

## 2021-10-04 ENCOUNTER — Emergency Department (HOSPITAL_COMMUNITY)
Admission: EM | Admit: 2021-10-04 | Discharge: 2021-10-04 | Disposition: A | Discharge status: Home / Self Care | Payer: PPO | Attending: Emergency Medicine | Admitting: Emergency Medicine

## 2021-10-04 ENCOUNTER — Other Ambulatory Visit: Payer: Self-pay

## 2021-10-04 ENCOUNTER — Ambulatory Visit: Payer: PPO | Admitting: Medical

## 2021-10-04 ENCOUNTER — Emergency Department (HOSPITAL_COMMUNITY): Payer: PPO

## 2021-10-04 DIAGNOSIS — M542 Cervicalgia: Secondary | ICD-10-CM | POA: Diagnosis not present

## 2021-10-04 DIAGNOSIS — R42 Dizziness and giddiness: Secondary | ICD-10-CM | POA: Insufficient documentation

## 2021-10-04 DIAGNOSIS — I5032 Chronic diastolic (congestive) heart failure: Secondary | ICD-10-CM | POA: Insufficient documentation

## 2021-10-04 DIAGNOSIS — Z7982 Long term (current) use of aspirin: Secondary | ICD-10-CM | POA: Diagnosis not present

## 2021-10-04 DIAGNOSIS — I4892 Unspecified atrial flutter: Secondary | ICD-10-CM

## 2021-10-04 DIAGNOSIS — E785 Hyperlipidemia, unspecified: Secondary | ICD-10-CM

## 2021-10-04 DIAGNOSIS — I10 Essential (primary) hypertension: Secondary | ICD-10-CM | POA: Diagnosis not present

## 2021-10-04 DIAGNOSIS — R079 Chest pain, unspecified: Secondary | ICD-10-CM

## 2021-10-04 DIAGNOSIS — Z79899 Other long term (current) drug therapy: Secondary | ICD-10-CM | POA: Insufficient documentation

## 2021-10-04 DIAGNOSIS — R0789 Other chest pain: Secondary | ICD-10-CM | POA: Diagnosis not present

## 2021-10-04 DIAGNOSIS — R0602 Shortness of breath: Secondary | ICD-10-CM | POA: Diagnosis not present

## 2021-10-04 DIAGNOSIS — R52 Pain, unspecified: Secondary | ICD-10-CM | POA: Diagnosis not present

## 2021-10-04 DIAGNOSIS — I2510 Atherosclerotic heart disease of native coronary artery without angina pectoris: Secondary | ICD-10-CM

## 2021-10-04 DIAGNOSIS — Z7901 Long term (current) use of anticoagulants: Secondary | ICD-10-CM | POA: Insufficient documentation

## 2021-10-04 LAB — BASIC METABOLIC PANEL
Anion gap: 6 (ref 5–15)
BUN: 16 mg/dL (ref 8–23)
CO2: 25 mmol/L (ref 22–32)
Calcium: 9 mg/dL (ref 8.9–10.3)
Chloride: 104 mmol/L (ref 98–111)
Creatinine, Ser: 1.04 mg/dL (ref 0.61–1.24)
GFR, Estimated: >60 mL/min (ref >60)
Glucose, Bld: 111 mg/dL — ABNORMAL HIGH (ref 70–99)
Potassium: 4 mmol/L (ref 3.5–5.1)
Sodium: 135 mmol/L (ref 135–145)

## 2021-10-04 LAB — TROPONIN I (HIGH SENSITIVITY)
Troponin I (High Sensitivity): 6 ng/L (ref <18)
Troponin I (High Sensitivity): 6 ng/L (ref <18)

## 2021-10-04 LAB — CBC
HCT: 37.9 % — ABNORMAL LOW (ref 39.0–52.0)
Hemoglobin: 12.8 g/dL — ABNORMAL LOW (ref 13.0–17.0)
MCH: 31.7 pg (ref 26.0–34.0)
MCHC: 33.8 g/dL (ref 30.0–36.0)
MCV: 93.8 fL (ref 80.0–100.0)
Platelets: 170 10*3/uL (ref 150–400)
RBC: 4.04 MIL/uL — ABNORMAL LOW (ref 4.22–5.81)
RDW: 14.6 % (ref 11.5–15.5)
WBC: 7.6 10*3/uL (ref 4.0–10.5)
nRBC: 0 % (ref 0.0–0.2)

## 2021-10-04 NOTE — Discharge Instructions (Signed)
Dr. Nelly Laurence office will contact you to set up an expedited stress test.  Please contact their office if you do not hear from them in the next week or so.  Please return to the emergency department for any worsening symptoms you might have.

## 2021-10-04 NOTE — ED Provider Notes (Signed)
[facility] EMERGENCY DEPARTMENT Provider Note   CSN: [id number] Arrival date & time: 10/04/21  4098     History Chief Complaint  Patient presents with   Chest Pain    Eric Benitez is a 81 y.o. male with history of CAD with stents on aspirin and Eliquis, hypertension, hyperlipidemia, and idiopathic pulmonary fibrosis who presents to the emergency department with a 2-day history of worsening chest tightness.  Patient is complaining of constant chest tightness that is worse with any exertion and better with rest.  He is also endorsing associated shortness of breath.  Patient also complaining of neck pain and dizziness but this has been chronic for several months.  Family at bedside states that it has been worsening over the last several days though.  Chart review revealed that the patient had an echocardiogram back in March with a normal ejection fraction and no valvular abnormalities.  Further, patient had a heart catheterization in April 2020 which did not show any new findings.  Stent at that time was patent.  Patient saw his pulmonologist last week and was complaining of some chest tightness in his neck pain at that time.  He does mention that he took a nitroglycerin earlier which did relieve some of his chest tightness.   Chest Pain      Home Medications Prior to Admission medications   Medication Sig Start Date End Date Taking? Authorizing Provider  albuterol (VENTOLIN HFA) 108 (90 Base) MCG/ACT inhaler Inhale into the lungs.   Yes [provider]  apixaban (ELIQUIS) 5 MG TABS tablet Take 1 tablet (5 mg total) by mouth every 12 (twelve) hours. 06/07/18  Yes Bhagat, Bhavinkumar, PA  atorvastatin (LIPITOR) 80 MG tablet Take 80 mg by mouth at bedtime.    Yes [provider]  doxazosin (CARDURA) 8 MG tablet Take 8 mg by mouth at bedtime.    Yes [provider]  fluticasone (FLONASE) 50 MCG/ACT nasal spray Place 2 sprays into the nose daily as needed  (allergies).   Yes [provider]  metoprolol succinate (TOPROL-XL) 25 MG 24 hr tablet Take 25 mg by mouth daily.    Yes [provider]  nitroGLYCERIN (NITROSTAT) 0.4 MG SL tablet PLACE 1 TABLET (0.4 MG TOTAL) UNDER THE TONGUE EVERY 5 (FIVE) MINUTES AS NEEDED FOR CHEST PAIN. 06/29/17  Yes Branch, Alphonse Guild, MD  pantoprazole (PROTONIX) 40 MG tablet TAKE 1 TABLET BY MOUTH DAILY 11/10/15  Yes Branch, Alphonse Guild, MD  ranolazine (RANEXA) 500 MG 12 hr tablet Take 500 mg by mouth 2 (two) times daily.  12/31/17  Yes [provider]  furosemide (LASIX) 20 MG tablet Take 2 tablets (40 mg total) by mouth daily for 3 days, THEN 1 tablet (20 mg total) daily. 05/02/21 08/03/21  Imogene Burn, PA-C  Nintedanib (OFEV) 150 MG CAPS Take 1 capsule (150 mg total) by mouth 2 (two) times daily. Patient not taking: Reported on 10/04/2021 08/29/21   Rigoberto Noel, MD  potassium chloride (KLOR-CON) 10 MEQ tablet Take 1 tablet (10 mEq total) by mouth daily. Patient not taking: Reported on 10/04/2021 07/25/21   Imogene Burn, PA-C      Allergies    Vancomycin    Review of Systems   Review of Systems  Cardiovascular:  Positive for chest pain.  All other systems reviewed and are negative.   Physical Exam Updated Vital Signs BP (!) 156/78   Pulse (!) 56   Temp 98.6 F (37 C) (Oral)  Resp 16   Ht '5\' 9"'$  (1.753 m)   Wt 74.8 kg   SpO2 96%   BMI 24.37 kg/m  Physical Exam Vitals and nursing note reviewed.  Constitutional:      General: He is not in acute distress.    Appearance: Normal appearance.  HENT:     Head: Normocephalic and atraumatic.  Eyes:     General:        Right eye: No discharge.        Left eye: No discharge.  Cardiovascular:     Comments: Regular rate and rhythm.  S1/S2 are distinct without any evidence of murmur, rubs, or gallops.  Radial pulses are 2+ bilaterally.  Dorsalis pedis pulses are 2+ bilaterally.  No evidence of pedal edema. Pulmonary:      Comments: Clear to auscultation bilaterally.  Normal effort.  No respiratory distress.  No evidence of wheezes, rales, or rhonchi heard throughout. Abdominal:     General: Abdomen is flat. Bowel sounds are normal. There is no distension.     Tenderness: There is no abdominal tenderness. There is no guarding or rebound.  Musculoskeletal:        General: Normal range of motion.     Cervical back: Neck supple.  Skin:    General: Skin is warm and dry.     Findings: No rash.  Neurological:     General: No focal deficit present.     Mental Status: He is alert.  Psychiatric:        Mood and Affect: Mood normal.        Behavior: Behavior normal.     ED Results / Procedures / Treatments   Labs (all labs ordered are listed, but only abnormal results are displayed) Labs Reviewed  BASIC METABOLIC PANEL - Abnormal; Notable for the following components:      Result Value   Glucose, Bld 111 (*)    All other components within normal limits  CBC - Abnormal; Notable for the following components:   RBC 4.04 (*)    Hemoglobin 12.8 (*)    HCT 37.9 (*)    All other components within normal limits  TROPONIN I (HIGH SENSITIVITY)  TROPONIN I (HIGH SENSITIVITY)    EKG EKG Interpretation  Date/Time:  Tuesday October 04 2021 09:02:51 EDT Ventricular Rate:  61 PR Interval:  220 QRS Duration: 88 QT Interval:  429 QTC Calculation: 433 R Axis:   61 Text Interpretation: Sinus rhythm Prolonged PR interval no acute ST/T changes Confirmed by Sherwood Gambler [phone]) on 10/04/2021 9:09:50 AM  Radiology DG Chest 2 View  Result Date: 10/04/2021 CLINICAL DATA:  Chest tightness EXAM: CHEST - 2 VIEW COMPARISON:  05/01/2021 FINDINGS: Chronic areas of scarring/fibrosis in the mid to lower lungs. No definite acute confluent opacities or effusions. Heart is normal size. Mediastinal contours within normal limits. No acute bony abnormality. IMPRESSION: Chronic scarring/fibrosis in the lungs.  No definite acute  process. Electronically Signed   By: Rolm Baptise M.D.   On: 10/04/2021 10:14    Procedures Procedures    Medications Ordered in ED Medications - No data to display  ED Course/ Medical Decision Making/ A&P Clinical Course as of 10/04/21 1527  Tue Oct 04, 2021  1420 I spoke with Dr. Gardiner Rhyme with cardiology who will come evaluate the patient at the bedside.  [CF]  1421 On reevaluation, patient states his chest tightness is improved although still present. [CF]  1421 Troponin I (High Sensitivity) Initial delta troponin are  negative. [CF]  7159 Basic metabolic panel(!) Normal. [CF]  1421 CBC(!) Slight anemia but does not meet transfusion criteria.  Seems to be at baseline for patient. [CF]  1522 Spoke with Dr. Gardiner Rhyme with cardiology who will get him plugged in for an expedited stress test in the outpatient setting.  From a cardiology standpoint he is safe for discharge. [CF]    Clinical Course User Index [CF] Hendricks Limes, PA-C                           Medical Decision Making Eric Benitez is a 81 y.o. male patient who presents to the emergency department today for further evaluation of chest tightness.  Patient is classified as a high risk cardiac patient given his history and story today.  I will get chest pain labs, chest x-ray, and EKG to further assess.  Apart from some elevated blood pressure here his vital signs are normal.  I will plan to reassess frequently as work-up results.  He is in no acute distress at this time.  After speaking with cardiology, we are both in agreement to let the patient go home and follow-up closely with cardiology for an outpatient stress test.  Patient amenable this plan.  I have a low suspicion for any acute ACS at this time.  He is safe for discharge at this time.  Strict return precautions were discussed.  He is safe for discharge.  Amount and/or Complexity of Data Reviewed Labs: ordered. Decision-making details documented in ED  Course. Radiology: ordered.    Final Clinical Impression(s) / ED Diagnoses Final diagnoses:  Chest pain, unspecified type    Rx / DC Orders ED Discharge Orders     None         Hendricks Limes, [place] 10/04/21 1527    Sherwood Gambler, MD 10/05/21 [phone]

## 2021-10-04 NOTE — ED Triage Notes (Signed)
Pt c/o chest tightness x 2 days, accompanied by Sob and dizziness. Pt also c/o neck pain. Per pt, past cardiac Hx of stent placement. Pt took one nitroglycerin this morning, states a "little bit of relief"

## 2021-10-04 NOTE — Consult Note (Addendum)
Cardiology Consultation   Patient ID: Eric Benitez MRN: [id number]; DOB: [birth year]  Admit date: 10/04/2021 Date of Consult: 10/04/2021  PCP:  Caryl Bis, MD   [place] Providers Cardiologist:  Carlyle Dolly, MD        Patient Profile:   Eric Benitez is a 81 y.o. male with a hx of  CAD status post LAD stenting, persistent atrial flutter status post DCCV 06/2018, hypertension, hyperlipidemia, IPF who is being seen 10/04/2021 for the evaluation of chest pain at the request of Dr. Regenia Skeeter.  History of Present Illness:   Eric Benitez is an 81 year old male with a history of CAD status post LAD stenting, persistent atrial flutter status post DCCV 06/2018, hypertension, hyperlipidemia, IPF who we are consulted by Dr. Verner Chol for evaluation of chest pain  Most recent cath 06/05/2019 showed patent LAD stent, ostial D1 60 to 70% stenosis.  Underwent Lexiscan Myoview 05/2019 which showed normal perfusion.  Echocardiogram 05/05/2021 showed EF 65 to 70%, normal RV function, no significant valvular disease.  He reports onset of chest pain 2 days ago.  Describes as tightness across lower chest.  Has been constant pain for past 2 days.  Initially 7 out of 10 intensity, currently 5 out of 10 in intensity.  Also reports feeling short of breath.  In the ED, vital signs notable for BP 163/83, pulse 63, SPO2 99% on room air.  Labs notable for creatinine 1.0, troponin 6 > 6, WBC 7.6, hemoglobin 12.8.  Chest x-ray shows chronic scarring/fibrosis in the lungs, no definite acute process.  EKG shows sinus rhythm, rate 61, first-degree AV block, no ST abnormalities.   Past Medical History:  Diagnosis Date   Arthritis    "neck, hands, fingers" (04/09/2014)   Bleeds easily ([place])    CAD (coronary artery disease)    a. s/p prior stenting of LAD b.  patent stent by cath in 2016 and 06/2017 --> residual 60% D1 stenosis and moderate disease along the RCA   COPD (chronic obstructive pulmonary  disease) (HCC)    GERD (gastroesophageal reflux disease)    Hyperlipemia    Hypertension    Kidney stones    "I've had several; had to go in and get them once" (04/09/2014)   Pneumonia 1940's   Sleep apnea     Past Surgical History:  Procedure Laterality Date   ANKLE FRACTURE SURGERY Right 2009   CARDIAC CATHETERIZATION  1990's X 2;  04/09/2014   CARDIOVERSION N/A 06/07/2018   Procedure: CARDIOVERSION;  Surgeon: Jolaine Artist, MD;  Location: [facility] ENDOSCOPY;  Service: Cardiovascular;  Laterality: N/A;   COLONOSCOPY N/A 01/22/2017   Dr. fields: 6 tubular adenomas removed, external hemorrhoids.  Recommended next exam in 3 years.   COLONOSCOPY WITH PROPOFOL N/A 03/02/2020   Surgeon: Hurshel Keys K, DO; Nonbleeding internal hemorrhoids, multiple diverticula in the sigmoid, descending, transverse colon, 2 polyps 4 to 6 mm in size and one 2 mm polyp.  Pathology with tubular adenoma and hyperplastic polyp. Recommended repeat in 5 years.   CORONARY ANGIOPLASTY WITH STENT PLACEMENT  2006   "2"   CYSTOSCOPY W/ STONE MANIPULATION  ~ 2008   ESOPHAGOGASTRODUODENOSCOPY N/A 01/22/2017   Dr. Oneida Alar: Mild to moderate gastritis.  No H. pylori   ESOPHAGOGASTRODUODENOSCOPY N/A 05/31/2018   Procedure: ESOPHAGOGASTRODUODENOSCOPY (EGD);  Surgeon: Danie Binder, MD;  normal examined esophagus, gastritis due to ASA, and normal examined duodenum.  No obvious source for melena.    FRACTURE SURGERY  INTRAVASCULAR PRESSURE WIRE/FFR STUDY N/A 06/08/2017   Procedure: INTRAVASCULAR PRESSURE WIRE/FFR STUDY;  Surgeon: Nelva Bush, MD;  Location: [place] CV LAB;  Service: Cardiovascular;  Laterality: N/A;   LEFT HEART CATH AND CORONARY ANGIOGRAPHY N/A 06/08/2017   Procedure: LEFT HEART CATH AND CORONARY ANGIOGRAPHY;  Surgeon: Nelva Bush, MD;  Location: [place] CV LAB;  Service: Cardiovascular;  Laterality: N/A;   LEFT HEART CATH AND CORONARY ANGIOGRAPHY N/A 06/05/2018   Procedure: LEFT HEART CATH AND  CORONARY ANGIOGRAPHY;  Surgeon: Belva Crome, MD;  Location: [place] CV LAB;  Service: Cardiovascular;  Laterality: N/A;   LEFT HEART CATHETERIZATION WITH CORONARY ANGIOGRAM N/A 04/09/2014   Procedure: LEFT HEART CATHETERIZATION WITH CORONARY ANGIOGRAM;  Surgeon: Leonie Man, MD;  Location[facility] C[facility] LAB;  Service: Cardiovascular;  Laterality: N/A;   NASAL SEPTUM SURGERY  1999   POLYPECTOMY  01/22/2017   Procedure: POLYPECTOMY;  Surgeon: Danie Binder, MD;  Location: [facility] ENDO SUITE;  Service: Endoscopy;;  right colon x4   POLYPECTOMY  03/02/2020   Procedure: POLYPECTOMY;  Surgeon: Eloise Harman, DO;  Location: [facility] ENDO SUITE;  Service: Endoscopy;;   TEE WITHOUT CARDIOVERSION N/A 06/07/2018   Procedure: TRANSESOPHAGEAL ECHOCARDIOGRAM (TEE);  Surgeon: Jolaine Artist, MD;  Location: [facility] ENDOSCOPY;  Service: Cardiovascular;  Laterality: N/A;      Inpatient Medications: Scheduled Meds:  Continuous Infusions:  PRN Meds:   Allergies:    Allergies  Allergen Reactions   Vancomycin Rash    Social History:   Social History   Socioeconomic History   Marital status: Married    Spouse name: Not on file   Number of children: Not on file   Years of education: Not on file   Highest education level: Not on file  Occupational History    Comment: Retired  Tobacco Use   Smoking status: Former    Packs/day: 1.50    Years: 22.00    Total pack years: 33.00    Types: Cigarettes    Start date: 11/28/1942    Quit date: 02/07/1964    Years since quitting: 14.6   Smokeless tobacco: Never  Vaping Use   Vaping Use: Never used  Substance and Sexual Activity   Alcohol use: No    Alcohol/week: 0.0 standard drinks of alcohol   Drug use: No   Sexual activity: Yes  Other Topics Concern   Not on file  Social History Narrative   Not on file   Social Determinants of Health   Financial Resource Strain: Low Risk  (06/26/2018)   Overall Financial Resource Strain (CARDIA)    Difficulty of  Paying Living Expenses: Not hard at all  Food Insecurity: No Food Insecurity (06/26/2018)   Hunger Vital Sign    Worried About Running Out of Food in the Last Year: Never true    [place] in the Last Year: Never true  Transportation Needs: No Transportation Needs (06/26/2018)   PRAPARE - Hydrologist (Medical): No    Lack of Transportation (Non-Medical): No  Physical Activity: Sufficiently Active (06/26/2018)   Exercise Vital Sign    Days of Exercise per Week: 5 days    Minutes of Exercise per Session: 30 min  Stress: No Stress Concern Present (06/26/2018)   [place]    Feeling of Stress : Only a little  Social Connections: Moderately Integrated (06/26/2018)   Social Connection and Isolation Panel [NHANES]  Frequency of Communication with Friends and Family: More than three times a week    Frequency of Social Gatherings with Friends and Family: More than three times a week    Attends Religious Services: 1 to 4 times per year    Active Member of [employer] or Organizations: No    Attends Archivist Meetings: Never    Marital Status: Married  Human resources officer Violence: Not At Risk (06/26/2018)   Humiliation, Afraid, Rape, and Kick questionnaire    Fear of Current or Ex-Partner: No    Emotionally Abused: No    Physically Abused: No    Sexually Abused: No    Family History:    Family History  Problem Relation Age of Onset   Heart disease Mother    Colon cancer Neg Hx    Gastric cancer Neg Hx    Esophageal cancer Neg Hx      ROS:  Please see the history of present illness.   All other ROS reviewed and negative.     Physical Exam/Data:   Vitals:   10/04/21 1300 10/04/21 1314 10/04/21 1330 10/04/21 1400  BP: (!) 172/82  (!) 170/83 (!) 156/78  Pulse: (!) 57  (!) 57 (!) 56  Resp: '12  12 16  '$ Temp:  98.6 F (37 C)    TempSrc:  Oral    SpO2: 97%  97% 96%  Weight:       Height:       No intake or output data in the 24 hours ending 10/04/21 1407    10/04/2021    9:07 AM 09/29/2021   10:27 AM 08/02/2021   10:33 AM  Last 3 Weights  Weight (lbs) 165 lb 162 lb 3.2 oz 162 lb 3.2 oz  Weight (kg) 74.844 kg 73.573 kg 73.573 kg     Body mass index is 24.37 kg/m.  General:  Well nourished, well developed, in no acute distress HEENT: normal Neck: no JVD Vascular: No carotid bruits; Distal pulses 2+ bilaterally Cardiac:  normal S1, S2; RRR; no murmur  Lungs:  rhonchi Abd: soft, nontender, no hepatomegaly  Ext: no edema Musculoskeletal:  No deformities, BUE and BLE strength normal and equal Skin: warm and dry  Neuro:  CNs 2-12 intact, no focal abnormalities noted Psych:  Normal affect   EKG:  The EKG was personally reviewed and demonstrates:  EKG shows sinus rhythm, rate 61, first-degree AV block, no ST abnormalities. Telemetry:  Telemetry was personally reviewed and demonstrates: Normal sinus rhythm  Relevant CV Studies:   Laboratory Data:  High Sensitivity Troponin:   Recent Labs  Lab 10/04/21 0919 10/04/21 1244  TROPONINIHS 6 6     Chemistry Recent Labs  Lab 10/04/21 0919  NA 135  K 4.0  CL 104  CO2 25  GLUCOSE 111*  BUN 16  CREATININE 1.04  CALCIUM 9.0  GFRNONAA >60  ANIONGAP 6    No results for input(s): "PROT", "ALBUMIN", "AST", "ALT", "ALKPHOS", "BILITOT" in the last 168 hours. Lipids No results for input(s): "CHOL", "TRIG", "HDL", "LABVLDL", "LDLCALC", "CHOLHDL" in the last 168 hours.  Hematology Recent Labs  Lab 10/04/21 0919  WBC 7.6  RBC 4.04*  HGB 12.8*  HCT 37.9*  MCV 93.8  MCH 31.7  MCHC 33.8  RDW 14.6  PLT 170   Thyroid No results for input(s): "TSH", "FREET4" in the last 168 hours.  BNPNo results for input(s): "BNP", "PROBNP" in the last 168 hours.  DDimer No results for input(s): "DDIMER" in the  last 168 hours.   Radiology/Studies:  DG Chest 2 View  Result Date: 10/04/2021 CLINICAL DATA:  Chest  tightness EXAM: CHEST - 2 VIEW COMPARISON:  05/01/2021 FINDINGS: Chronic areas of scarring/fibrosis in the mid to lower lungs. No definite acute confluent opacities or effusions. Heart is normal size. Mediastinal contours within normal limits. No acute bony abnormality. IMPRESSION: Chronic scarring/fibrosis in the lungs.  No definite acute process. Electronically Signed   By: Rolm Baptise M.D.   On: 10/04/2021 10:14     Assessment and Plan:   Chest pain: Given persistent chest pain x2 days with negative troponins, suspect noncardiac chest pain.  No ischemic changes on EKG.  Given his history, will plan outpatient Myoview to evaluate for ischemia.  CAD: Most recent cath 06/05/2019 showed patent LAD stent, ostial D1 60 to 70% stenosis.  Underwent Lexiscan Myoview 05/2019 which showed normal perfusion.  Echocardiogram 05/05/2021 showed EF 65 to 70%, normal RV function, no significant valvular disease. -Continue Eliquis, statin  Atrial flutter: Status post DCCV 06/2018.  Currently in sinus rhythm.  Continue Eliquis, Toprol-XL.  Chronic diastolic heart failure: On Lasix 20 mg daily  Hyperlipidemia: Continue atorvastatin 80 mg daily  Disposition: OK for discharge from cardiac standpoint.  Will schedule outpatient Myoview and follow-up with Dr. Harl Bowie or APP  Shared Decision Making/Informed Consent The risks [chest pain, shortness of breath, cardiac arrhythmias, dizziness, blood pressure fluctuations, myocardial infarction, stroke/transient ischemic attack, nausea, vomiting, allergic reaction, radiation exposure, metallic taste sensation and life-threatening complications (estimated to be 1 in 10,000)], benefits (risk stratification, diagnosing coronary artery disease, treatment guidance) and alternatives of a nuclear stress test were discussed in detail with Mr. Dunshee and he agrees to proceed.   For questions or updates, please contact [place] Please consult [email or web address] for contact info  under    Signed, Donato Heinz, MD  10/04/2021 2:07 PM

## 2021-10-04 NOTE — ED Notes (Signed)
Per PA-C, pt is allowed to eat and drink

## 2021-10-05 ENCOUNTER — Telehealth: Payer: Self-pay | Admitting: Cardiology

## 2021-10-05 ENCOUNTER — Other Ambulatory Visit: Payer: Self-pay | Admitting: *Deleted

## 2021-10-05 DIAGNOSIS — R079 Chest pain, unspecified: Secondary | ICD-10-CM

## 2021-10-05 DIAGNOSIS — R0789 Other chest pain: Secondary | ICD-10-CM

## 2021-10-05 NOTE — Telephone Encounter (Signed)
Pt daughter called stating that she was told at [facility] yesterday that pt needs a stress test and wants to know if we can do it.

## 2021-10-05 NOTE — Telephone Encounter (Signed)
Furth, Cadence H, PA-C  P [facility] [employer] needs a Exercise Myoview test at [employer], and 4-6 week follow-up in [place] with Dr. Harl Bowie or an APP in 1 month     Daughter called,wants testing done at [facility], will notify our front desk    I spoke with patient,he asked that I call either of his daughters. Was able to leave message for Rosario Jacks. I sent test instructions,date/registration time thru MyChart. Await call back.

## 2021-10-05 NOTE — Telephone Encounter (Signed)
I spoke with daughter and discussed instructions for exercise myoview. In addition to holding the metoprolol the am of test, he will also hold lasix the am of test.

## 2021-10-06 ENCOUNTER — Other Ambulatory Visit: Payer: Self-pay

## 2021-10-06 DIAGNOSIS — I10 Essential (primary) hypertension: Secondary | ICD-10-CM | POA: Diagnosis not present

## 2021-10-06 DIAGNOSIS — E782 Mixed hyperlipidemia: Secondary | ICD-10-CM | POA: Diagnosis not present

## 2021-10-06 DIAGNOSIS — R079 Chest pain, unspecified: Secondary | ICD-10-CM

## 2021-10-11 ENCOUNTER — Encounter: Payer: Self-pay | Admitting: Physician Assistant

## 2021-10-11 ENCOUNTER — Ambulatory Visit (HOSPITAL_COMMUNITY)
Admission: RE | Admit: 2021-10-11 | Discharge: 2021-10-11 | Disposition: A | Discharge status: Home / Self Care | Payer: PPO | Source: Ambulatory Visit | Attending: Medical | Admitting: Medical

## 2021-10-11 ENCOUNTER — Encounter (HOSPITAL_BASED_OUTPATIENT_CLINIC_OR_DEPARTMENT_OTHER)
Admission: RE | Admit: 2021-10-11 | Discharge: 2021-10-11 | Disposition: A | Discharge status: Home / Self Care | Payer: PPO | Source: Ambulatory Visit | Attending: Medical | Admitting: Medical

## 2021-10-11 DIAGNOSIS — R079 Chest pain, unspecified: Secondary | ICD-10-CM

## 2021-10-11 LAB — NM MYOCAR MULTI W/SPECT W/WALL MOTION / EF
LV dias vol: 73 mL (ref 62–150)
LV sys vol: 13 mL
Nuc Stress EF: 82 %
Peak HR: 93 {beats}/min
RATE: 0.4
Rest HR: 62 {beats}/min
Rest Nuclear Isotope Dose: 10.5 mCi
SDS: 2
SRS: 1
SSS: 3
ST Depression (mm): 0 mm
Stress Nuclear Isotope Dose: 29.2 mCi
TID: 0.93

## 2021-10-11 MED ORDER — REGADENOSON 0.4 MG/5ML IV SOLN
INTRAVENOUS | Status: AC
  Administered 2021-10-11: 0.4 mg via INTRAVENOUS
  Filled 2021-10-11: qty 5

## 2021-10-11 MED ORDER — TECHNETIUM TC 99M TETROFOSMIN IV KIT
30.0000 | PACK | Freq: Once | INTRAVENOUS | Status: AC | PRN
  Administered 2021-10-11: 29 via INTRAVENOUS

## 2021-10-11 MED ORDER — TECHNETIUM TC 99M TETROFOSMIN IV KIT
10.0000 | PACK | Freq: Once | INTRAVENOUS | Status: AC | PRN
  Administered 2021-10-11: 10.5 via INTRAVENOUS

## 2021-10-11 MED ORDER — SODIUM CHLORIDE FLUSH 0.9 % IV SOLN
INTRAVENOUS | Status: AC
  Administered 2021-10-11: 10 mL via INTRAVENOUS
  Filled 2021-10-11: qty 10

## 2021-10-11 NOTE — Progress Notes (Signed)
Pt presented for stress test in stable condition. Ordered as exercise nuc per notes but patient ambulates with cane and reports some baseline unchanged unsteadiness. For safety reasons, test changed to [place], no contraindications, which patient tolerated well.

## 2021-10-13 ENCOUNTER — Telehealth: Payer: Self-pay

## 2021-10-13 NOTE — Telephone Encounter (Signed)
Patient notified and verbalized understanding. Patient had no questions or concerns at this time. PCP copied 

## 2021-10-13 NOTE — Telephone Encounter (Signed)
-----   Message from Lamar Laundry, RN sent at 10/13/2021  1:19 PM EDT -----  ----- Message ----- From: Antony Madura, PA-C Sent: 10/13/2021  12:18 PM EDT To: Lamar Laundry, RN  Stress test showed no ischemia, normal pump function, overall low risk

## 2021-10-25 DIAGNOSIS — Z20828 Contact with and (suspected) exposure to other viral communicable diseases: Secondary | ICD-10-CM | POA: Diagnosis not present

## 2021-10-27 ENCOUNTER — Ambulatory Visit: Payer: PPO | Admitting: Student

## 2021-11-11 ENCOUNTER — Ambulatory Visit: Payer: PPO | Admitting: Nurse Practitioner

## 2021-12-05 ENCOUNTER — Ambulatory Visit: Payer: PPO | Admitting: Medical

## 2021-12-06 DIAGNOSIS — I10 Essential (primary) hypertension: Secondary | ICD-10-CM | POA: Diagnosis not present

## 2021-12-06 DIAGNOSIS — H2513 Age-related nuclear cataract, bilateral: Secondary | ICD-10-CM | POA: Diagnosis not present

## 2021-12-06 DIAGNOSIS — H16223 Keratoconjunctivitis sicca, not specified as Sjogren's, bilateral: Secondary | ICD-10-CM | POA: Diagnosis not present

## 2021-12-06 DIAGNOSIS — E782 Mixed hyperlipidemia: Secondary | ICD-10-CM | POA: Diagnosis not present

## 2021-12-19 DIAGNOSIS — K2100 Gastro-esophageal reflux disease with esophagitis, without bleeding: Secondary | ICD-10-CM | POA: Diagnosis not present

## 2021-12-19 DIAGNOSIS — D529 Folate deficiency anemia, unspecified: Secondary | ICD-10-CM | POA: Diagnosis not present

## 2021-12-19 DIAGNOSIS — D519 Vitamin B12 deficiency anemia, unspecified: Secondary | ICD-10-CM | POA: Diagnosis not present

## 2021-12-19 DIAGNOSIS — I10 Essential (primary) hypertension: Secondary | ICD-10-CM | POA: Diagnosis not present

## 2021-12-19 DIAGNOSIS — E7849 Other hyperlipidemia: Secondary | ICD-10-CM | POA: Diagnosis not present

## 2021-12-19 DIAGNOSIS — E782 Mixed hyperlipidemia: Secondary | ICD-10-CM | POA: Diagnosis not present

## 2021-12-19 DIAGNOSIS — D649 Anemia, unspecified: Secondary | ICD-10-CM | POA: Diagnosis not present

## 2021-12-23 DIAGNOSIS — N401 Enlarged prostate with lower urinary tract symptoms: Secondary | ICD-10-CM | POA: Diagnosis not present

## 2021-12-23 DIAGNOSIS — D6869 Other thrombophilia: Secondary | ICD-10-CM | POA: Diagnosis not present

## 2021-12-23 DIAGNOSIS — Z23 Encounter for immunization: Secondary | ICD-10-CM | POA: Diagnosis not present

## 2021-12-23 DIAGNOSIS — E7849 Other hyperlipidemia: Secondary | ICD-10-CM | POA: Diagnosis not present

## 2021-12-23 DIAGNOSIS — D649 Anemia, unspecified: Secondary | ICD-10-CM | POA: Diagnosis not present

## 2021-12-23 DIAGNOSIS — Z6825 Body mass index (BMI) 25.0-25.9, adult: Secondary | ICD-10-CM | POA: Diagnosis not present

## 2021-12-23 DIAGNOSIS — I25119 Atherosclerotic heart disease of native coronary artery with unspecified angina pectoris: Secondary | ICD-10-CM | POA: Diagnosis not present

## 2021-12-23 DIAGNOSIS — I482 Chronic atrial fibrillation, unspecified: Secondary | ICD-10-CM | POA: Diagnosis not present

## 2021-12-23 DIAGNOSIS — I10 Essential (primary) hypertension: Secondary | ICD-10-CM | POA: Diagnosis not present

## 2021-12-23 DIAGNOSIS — K2100 Gastro-esophageal reflux disease with esophagitis, without bleeding: Secondary | ICD-10-CM | POA: Diagnosis not present

## 2022-01-05 DIAGNOSIS — H25813 Combined forms of age-related cataract, bilateral: Secondary | ICD-10-CM | POA: Diagnosis not present

## 2022-01-05 DIAGNOSIS — H01004 Unspecified blepharitis left upper eyelid: Secondary | ICD-10-CM | POA: Diagnosis not present

## 2022-01-05 DIAGNOSIS — H01002 Unspecified blepharitis right lower eyelid: Secondary | ICD-10-CM | POA: Diagnosis not present

## 2022-01-05 DIAGNOSIS — H01001 Unspecified blepharitis right upper eyelid: Secondary | ICD-10-CM | POA: Diagnosis not present

## 2022-01-09 DIAGNOSIS — H25812 Combined forms of age-related cataract, left eye: Secondary | ICD-10-CM | POA: Diagnosis not present

## 2022-01-11 ENCOUNTER — Encounter (HOSPITAL_COMMUNITY): Payer: Self-pay

## 2022-01-11 ENCOUNTER — Encounter (HOSPITAL_COMMUNITY)
Admission: RE | Admit: 2022-01-11 | Discharge: 2022-01-11 | Disposition: A | Discharge status: Home / Self Care | Payer: PPO | Source: Ambulatory Visit | Attending: Ophthalmology | Admitting: Ophthalmology

## 2022-01-11 HISTORY — DX: Unspecified pneumoconiosis: J64

## 2022-01-11 HISTORY — DX: Personal history of urinary calculi: Z87.442

## 2022-01-11 NOTE — H&P (Signed)
Surgical History & Physical  Patient Name: Eric Benitez DOB: [birth year]  Surgery: Cataract extraction with intraocular lens implant phacoemulsification; Left Eye  Surgeon: Baruch Goldmann MD Surgery Date:  01-16-22 Pre-Op Date:  01-05-22  HPI: A 63 Yr. old male patient present for cataract eval per Dr. Hassell Done. 1. 1. The patient complains of difficulty when driving due to glare from headlights or sun, which began many years ago. Has progressively worsen. Both eyes are affected. The episode is constant. This is negatively affecting the patient's quality of life and the patient is unable to function adequately in life with the current level of vision. HPI Completed by Dr. Baruch Goldmann  Medical History: Dry Eyes Cataracts Arthritis Heart Problem High Blood Pressure LDL Lung Problems acid reflux  Review of Systems Cardiovascular High Blood Pressure, A-fib All recorded systems are negative except as noted above.  Social   Former smoker / Cigarettes   Medication  Pantoprazole, Metoprolol, Vitamin B-12, Allergy relief, Ranexa, Atorvastatin, Doxazosin, Lisinopril, Eliquis,   Sx/Procedures  Sinus Surgery, Heart shocked,   Drug Allergies   NKDA  History & Physical: Heent: Cataract, left eye NECK: supple without bruits LUNGS: lungs clear to auscultation CV: regular rate and rhythm Abdomen: soft and non-tender Impression & Plan: Assessment: 1.  COMBINED FORMS AGE RELATED CATARACT; Both Eyes (H25.813) 2.  BLEPHARITIS; Right Upper Lid, Right Lower Lid, Left Upper Lid, Left Lower Lid (H01.001, H01.002,H01.004,H01.005) 3.  VITREOUS DETACHMENT PVD; Left Eye (H43.812) 4.  CONJUNCTIVOCHALASIS; Both Eyes (H11.823) 5.  Presbyopia (H52.4)  Plan: 1.  Cataract accounts for the patient's decreased vision. This visual impairment is not correctable with a tolerable change in glasses or contact lenses. Cataract surgery with an implantation of a new lens should significantly improve the visual  and functional status of the patient. Discussed all risks, benefits, alternatives, and potential complications. Discussed the procedures and recovery. Patient desires to have surgery. A-scan ordered and performed today for intra-ocular lens calculations. The surgery will be performed in order to improve vision for driving, reading, and for eye examinations. Recommend phacoemulsification with intra-ocular lens. Recommend Dextenza for post-operative pain and inflammation. Left Eye. Dilates poorly - shugarcaine by protocol. Malyugin Ring. Omidira. Recommend Vivity Lens.  2.  Recommend regular lid cleaning.  3.  Asymptomatic. RD precautions given. Patient to call with increase in flashing lights/floaters/dark curtain.  4.  Artificial tears 1 drop 2-3x/day. as needed.  5.  Recommend presbyopia correcting IOL.

## 2022-01-16 ENCOUNTER — Ambulatory Visit (HOSPITAL_COMMUNITY)
Admission: RE | Admit: 2022-01-16 | Discharge: 2022-01-16 | Disposition: A | Discharge status: Home / Self Care | Payer: PPO | Attending: Ophthalmology | Admitting: Ophthalmology

## 2022-01-16 ENCOUNTER — Ambulatory Visit (HOSPITAL_COMMUNITY): Payer: PPO | Admitting: Certified Registered Nurse Anesthetist

## 2022-01-16 ENCOUNTER — Encounter (HOSPITAL_COMMUNITY)
Admission: RE | Disposition: A | Discharge status: Home / Self Care | Payer: Self-pay | Source: Home / Self Care | Attending: Ophthalmology

## 2022-01-16 ENCOUNTER — Encounter (HOSPITAL_COMMUNITY): Payer: Self-pay | Admitting: Ophthalmology

## 2022-01-16 ENCOUNTER — Ambulatory Visit (HOSPITAL_BASED_OUTPATIENT_CLINIC_OR_DEPARTMENT_OTHER): Payer: PPO | Admitting: Certified Registered Nurse Anesthetist

## 2022-01-16 DIAGNOSIS — H0100A Unspecified blepharitis right eye, upper and lower eyelids: Secondary | ICD-10-CM | POA: Insufficient documentation

## 2022-01-16 DIAGNOSIS — J449 Chronic obstructive pulmonary disease, unspecified: Secondary | ICD-10-CM | POA: Diagnosis not present

## 2022-01-16 DIAGNOSIS — H0100B Unspecified blepharitis left eye, upper and lower eyelids: Secondary | ICD-10-CM | POA: Insufficient documentation

## 2022-01-16 DIAGNOSIS — Z87891 Personal history of nicotine dependence: Secondary | ICD-10-CM

## 2022-01-16 DIAGNOSIS — H25812 Combined forms of age-related cataract, left eye: Secondary | ICD-10-CM

## 2022-01-16 DIAGNOSIS — G4730 Sleep apnea, unspecified: Secondary | ICD-10-CM | POA: Diagnosis not present

## 2022-01-16 DIAGNOSIS — K219 Gastro-esophageal reflux disease without esophagitis: Secondary | ICD-10-CM | POA: Insufficient documentation

## 2022-01-16 DIAGNOSIS — H25813 Combined forms of age-related cataract, bilateral: Secondary | ICD-10-CM | POA: Diagnosis not present

## 2022-01-16 DIAGNOSIS — I25119 Atherosclerotic heart disease of native coronary artery with unspecified angina pectoris: Secondary | ICD-10-CM | POA: Insufficient documentation

## 2022-01-16 DIAGNOSIS — D649 Anemia, unspecified: Secondary | ICD-10-CM | POA: Diagnosis not present

## 2022-01-16 DIAGNOSIS — H524 Presbyopia: Secondary | ICD-10-CM | POA: Insufficient documentation

## 2022-01-16 DIAGNOSIS — I10 Essential (primary) hypertension: Secondary | ICD-10-CM | POA: Insufficient documentation

## 2022-01-16 DIAGNOSIS — H43812 Vitreous degeneration, left eye: Secondary | ICD-10-CM | POA: Insufficient documentation

## 2022-01-16 DIAGNOSIS — K279 Peptic ulcer, site unspecified, unspecified as acute or chronic, without hemorrhage or perforation: Secondary | ICD-10-CM | POA: Insufficient documentation

## 2022-01-16 DIAGNOSIS — H11823 Conjunctivochalasis, bilateral: Secondary | ICD-10-CM | POA: Insufficient documentation

## 2022-01-16 HISTORY — PX: CATARACT EXTRACTION W/PHACO: SHX586

## 2022-01-16 SURGERY — PHACOEMULSIFICATION, CATARACT, WITH IOL INSERTION
Anesthesia: Monitor Anesthesia Care | Site: Eye | Laterality: Left

## 2022-01-16 MED ORDER — EPINEPHRINE PF 1 MG/ML IJ SOLN
INTRAMUSCULAR | Status: AC
  Filled 2022-01-16: qty 2

## 2022-01-16 MED ORDER — PHENYLEPHRINE HCL 2.5 % OP SOLN
1.0000 [drp] | OPHTHALMIC | Status: AC | PRN
Start: 2022-01-16 — End: 2022-01-16
  Administered 2022-01-16 (×3): 1 [drp] via OPHTHALMIC

## 2022-01-16 MED ORDER — PHENYLEPHRINE-KETOROLAC 1-0.3 % IO SOLN
INTRAOCULAR | Status: DC | PRN
  Administered 2022-01-16: 500 mL via OPHTHALMIC

## 2022-01-16 MED ORDER — SODIUM HYALURONATE 23MG/ML IO SOSY
PREFILLED_SYRINGE | INTRAOCULAR | Status: DC | PRN
  Administered 2022-01-16: .6 mL via INTRAOCULAR

## 2022-01-16 MED ORDER — BSS IO SOLN
INTRAOCULAR | Status: DC | PRN
  Administered 2022-01-16: 15 mL via INTRAOCULAR

## 2022-01-16 MED ORDER — TROPICAMIDE 1 % OP SOLN
1.0000 [drp] | OPHTHALMIC | Status: AC | PRN
  Administered 2022-01-16 (×3): 1 [drp] via OPHTHALMIC

## 2022-01-16 MED ORDER — STERILE WATER FOR IRRIGATION IR SOLN
Status: DC | PRN
  Administered 2022-01-16: 250 mL

## 2022-01-16 MED ORDER — TETRACAINE HCL 0.5 % OP SOLN
1.0000 [drp] | OPHTHALMIC | Status: AC | PRN
Start: 2022-01-16 — End: 2022-01-16
  Administered 2022-01-16 (×3): 1 [drp] via OPHTHALMIC

## 2022-01-16 MED ORDER — LACTATED RINGERS IV SOLN: INTRAVENOUS | Status: DC

## 2022-01-16 MED ORDER — LIDOCAINE HCL (PF) 1 % IJ SOLN
INTRAOCULAR | Status: DC | PRN
  Administered 2022-01-16: 1 mL via OPHTHALMIC

## 2022-01-16 MED ORDER — LIDOCAINE HCL 3.5 % OP GEL
1.0000 | Freq: Once | OPHTHALMIC | Status: AC
  Administered 2022-01-16: 1 via OPHTHALMIC

## 2022-01-16 MED ORDER — SODIUM HYALURONATE 10 MG/ML IO SOLUTION
PREFILLED_SYRINGE | INTRAOCULAR | Status: DC | PRN
  Administered 2022-01-16: .85 mL via INTRAOCULAR

## 2022-01-16 MED ORDER — MIDAZOLAM HCL 2 MG/2ML IJ SOLN
INTRAMUSCULAR | Status: AC
  Filled 2022-01-16: qty 2

## 2022-01-16 MED ORDER — MOXIFLOXACIN HCL 0.5 % OP SOLN
OPHTHALMIC | Status: DC | PRN
  Administered 2022-01-16: .2 mL via OPHTHALMIC

## 2022-01-16 MED ORDER — MIDAZOLAM HCL 5 MG/5ML IJ SOLN
INTRAMUSCULAR | Status: DC | PRN
  Administered 2022-01-16: 1 mg via INTRAVENOUS

## 2022-01-16 MED ORDER — MOXIFLOXACIN HCL 5 MG/ML IO SOLN
INTRAOCULAR | Status: AC
  Filled 2022-01-16: qty 1

## 2022-01-16 MED ORDER — PHENYLEPHRINE-KETOROLAC 1-0.3 % IO SOLN
INTRAOCULAR | Status: AC
  Filled 2022-01-16: qty 4

## 2022-01-16 MED ORDER — POVIDONE-IODINE 5 % OP SOLN
OPHTHALMIC | Status: DC | PRN
  Administered 2022-01-16: 1 via OPHTHALMIC

## 2022-01-16 SURGICAL SUPPLY — 16 items
CATARACT SUITE SIGHTPATH (MISCELLANEOUS) ×1 IMPLANT
CLOTH BEACON ORANGE TIMEOUT ST (SAFETY) ×1 IMPLANT
EYE SHIELD UNIVERSAL CLEAR (GAUZE/BANDAGES/DRESSINGS) IMPLANT
FEE CATARACT SUITE SIGHTPATH (MISCELLANEOUS) ×1 IMPLANT
GLOVE BIOGEL PI IND STRL 6.5 (GLOVE) IMPLANT
GLOVE BIOGEL PI IND STRL 7.0 (GLOVE) ×2 IMPLANT
GLOVE SURG SS PI 7.0 STRL IVOR (GLOVE) IMPLANT
LENS IOL RAYNER 21.5 (Intraocular Lens) ×1 IMPLANT
LENS IOL RAYONE EMV 21.5 (Intraocular Lens) IMPLANT
NDL HYPO 18GX1.5 BLUNT FILL (NEEDLE) ×1 IMPLANT
NEEDLE HYPO 18GX1.5 BLUNT FILL (NEEDLE) ×1 IMPLANT
PAD ARMBOARD 7.5X6 YLW CONV (MISCELLANEOUS) ×1 IMPLANT
SYR TB 1ML LL NO SAFETY (SYRINGE) ×1 IMPLANT
TAPE SURG TRANSPORE 1 IN (GAUZE/BANDAGES/DRESSINGS) IMPLANT
TAPE SURGICAL TRANSPORE 1 IN (GAUZE/BANDAGES/DRESSINGS) ×1
WATER STERILE IRR 250ML POUR (IV SOLUTION) ×1 IMPLANT

## 2022-01-16 NOTE — Transfer of Care (Signed)
Immediate Anesthesia Transfer of Care Note  Patient: Eric Benitez  Procedure(s) Performed: CATARACT EXTRACTION PHACO AND INTRAOCULAR LENS PLACEMENT (IOC) (Left: Eye)  Patient Location: PACU  Anesthesia Type:MAC  Level of Consciousness: awake, alert , and oriented  Airway & Oxygen Therapy: Patient Spontanous Breathing  Post-op Assessment: Report given to RN and Post -op Vital signs reviewed and stable  Post vital signs: Reviewed and stable  Last Vitals:  Vitals Value Taken Time  BP    Temp    Pulse    Resp    SpO2      Last Pain:  Vitals:   01/16/22 1033  TempSrc: Oral  PainSc: 0-No pain         Complications: No notable events documented.

## 2022-01-16 NOTE — Op Note (Signed)
Date of procedure: 01/16/22  Pre-operative diagnosis: Visually significant age-related combined cataract, Left Eye (H25.812)  Post-operative diagnosis: Visually significant age-related combined cataract, Left Eye (H25.812)  Procedure: Removal of cataract via phacoemulsification and insertion of intra-ocular lens Rayner [id number] +21.5D into the capsular bag of the Left Eye  Attending surgeon: Gerda Diss. Starlin Steib, MD, MA  Anesthesia: MAC, Topical Akten  Complications: None  Estimated Blood Loss: <15m (minimal)  Specimens: None  Implants: As above  Indications:  Visually significant age-related cataract, Left Eye  Procedure:  The patient was seen and identified in the pre-operative area. The operative eye was identified and dilated.  The operative eye was marked.  Topical anesthesia was administered to the operative eye.     The patient was then to the operative suite and placed in the supine position.  A timeout was performed confirming the patient, procedure to be performed, and all other relevant information.   The patient's face was prepped and draped in the usual fashion for intra-ocular surgery.  A lid speculum was placed into the operative eye and the surgical microscope moved into place and focused.  An inferotemporal paracentesis was created using a 20 gauge paracentesis blade.  Shugarcaine was injected into the anterior chamber.  Viscoelastic was injected into the anterior chamber.  A temporal clear-corneal main wound incision was created using a 2.462mmicrokeratome.  A continuous curvilinear capsulorrhexis was initiated using an irrigating cystitome and completed using capsulorrhexis forceps.  Hydrodissection and hydrodeliniation were performed.  Viscoelastic was injected into the anterior chamber.  A phacoemulsification handpiece and a chopper as a second instrument were used to remove the nucleus and epinucleus. The irrigation/aspiration handpiece was used to remove any remaining  cortical material.   The capsular bag was reinflated with viscoelastic, checked, and found to be intact.  The intraocular lens was inserted into the capsular bag.  The irrigation/aspiration handpiece was used to remove any remaining viscoelastic.  The clear corneal wound and paracentesis wounds were then hydrated and checked with Weck-Cels to be watertight. 0.90m74mf moxifloxacin was injected into the anterior chamber. The lid-speculum was removed.  The drape was removed.  The patient's face was cleaned with a wet and dry 4x4.    A clear shield was taped over the eye. The patient was taken to the post-operative care unit in good condition, having tolerated the procedure well.  Post-Op Instructions: [facility] and will receive all other orders and instructions.

## 2022-01-16 NOTE — Interval H&P Note (Signed)
History and Physical Interval Note:  01/16/2022 10:55 AM  Eric Benitez  has presented today for surgery, with the diagnosis of combined forms age related cataract; left.  The various methods of treatment have been discussed with the patient and family. After consideration of risks, benefits and other options for treatment, the patient has consented to  Procedure(s) with comments: CATARACT EXTRACTION PHACO AND INTRAOCULAR LENS PLACEMENT ([place]) (Left) - CDE as a surgical intervention.  The patient's history has been reviewed, patient examined, no change in status, stable for surgery.  I have reviewed the patient's chart and labs.  Questions were answered to the patient's satisfaction.     Baruch Goldmann

## 2022-01-16 NOTE — Anesthesia Preprocedure Evaluation (Signed)
Anesthesia Evaluation  Patient identified by MRN, date of birth, ID band Patient awake    Reviewed: Allergy & Precautions, H&P , NPO status , Patient's Chart, lab work & pertinent test results, reviewed documented beta blocker date and time   Airway Mallampati: II  TM Distance: >3 FB Neck ROM: full    Dental no notable dental hx.    Pulmonary asthma , sleep apnea , pneumonia, COPD, former smoker   Pulmonary exam normal breath sounds clear to auscultation       Cardiovascular Exercise Tolerance: Good hypertension, + angina  + CAD   Rhythm:regular Rate:Normal     Neuro/Psych negative neurological ROS  negative psych ROS   GI/Hepatic Neg liver ROS, PUD,GERD  Medicated,,  Endo/Other  negative endocrine ROS    Renal/GU negative Renal ROS  negative genitourinary   Musculoskeletal   Abdominal   Peds  Hematology  (+) Blood dyscrasia, anemia   Anesthesia Other Findings   Reproductive/Obstetrics negative OB ROS                             Anesthesia Physical Anesthesia Plan  ASA: 3  Anesthesia Plan: MAC   Post-op Pain Management:    Induction:   PONV Risk Score and Plan:   Airway Management Planned:   Additional Equipment:   Intra-op Plan:   Post-operative Plan:   Informed Consent: I have reviewed the patients History and Physical, chart, labs and discussed the procedure including the risks, benefits and alternatives for the proposed anesthesia with the patient or authorized representative who has indicated his/her understanding and acceptance.     Dental Advisory Given  Plan Discussed with: CRNA  Anesthesia Plan Comments:        Anesthesia Quick Evaluation

## 2022-01-16 NOTE — Discharge Instructions (Signed)
Please discharge patient when stable, will follow up today with Dr. Chaunda Vandergriff at the [facility] office immediately following discharge.  Leave shield in place until visit.  All paperwork with discharge instructions will be given at the office.  [facility] [place] Address:  [address]  [place], [place] [address]  

## 2022-01-16 NOTE — Anesthesia Postprocedure Evaluation (Signed)
Anesthesia Post Note  Patient: Eric Benitez  Procedure(s) Performed: CATARACT EXTRACTION PHACO AND INTRAOCULAR LENS PLACEMENT (I[facility]) (Left: Eye)  Patient location during evaluation: Phase II Anesthesia Type: MAC Level of consciousness: awake Pain management: pain level controlled Vital Signs Assessment: post-procedure vital signs reviewed and stable Respiratory status: spontaneous breathing and respiratory function stable Cardiovascular status: blood pressure returned to baseline and stable Postop Assessment: no headache and no apparent nausea or vomiting Anesthetic complications: no Comments: Late entry   No notable events documented.   Last Vitals:  Vitals:   01/16/22 1033 01/16/22 1124  BP: (!) 149/78 (!) 154/72  Pulse: 71 65  Resp: 16 15  Temp: 36.7 C 36.7 C  SpO2: 100% 100%    Last Pain:  Vitals:   01/16/22 1124  TempSrc: Oral  PainSc: 0-No pain                 Louann Sjogren

## 2022-01-18 ENCOUNTER — Encounter (HOSPITAL_COMMUNITY): Payer: Self-pay | Admitting: Ophthalmology

## 2022-01-20 ENCOUNTER — Ambulatory Visit: Payer: PPO | Admitting: Pulmonary Disease

## 2022-01-20 ENCOUNTER — Encounter: Payer: Self-pay | Admitting: Pulmonary Disease

## 2022-01-20 VITALS — BP 140/80 | HR 70 | Ht 68.0 in | Wt 172.0 lb

## 2022-01-20 DIAGNOSIS — J84112 Idiopathic pulmonary fibrosis: Secondary | ICD-10-CM

## 2022-01-20 NOTE — Progress Notes (Signed)
   Subjective:    Patient ID: Tilden Fossa, male    DOB: 1940-05-25, 81 y.o.   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  There was mold under the floorboards which was cleared in 2022. 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  Lives with his wife Otilio Connors in his 45s less than 10 pack years  Chief Complaint  Patient presents with   Follow-up    Pt f/u states he feels well but does have DOE, that improves with rest   He was unable to tolerate Ofev due to chest pain and diarrhea and stopped.  He does not want to trial another antifibrotic. He states breathing is at baseline.  He gets short of breath on activities around the house.  He is able to walk in the store at his own pace holding onto the cart.  He denies pedal edema. Chest pain issues are resolved  On ambulation, oxygen saturation stayed at 97% after walking 3 laps heart rate increased from 66-87  Significant tests/ events reviewed  HRCT 06/2021 UIP pattern 06/2021 ANA, CCP neg   07/2021 PFTs ratio 77, FVC 68%, TLC 66%, DLCO 14.40/60%   10/2015 PFTs moderate intraparenchymal restriction, ratio 89, FEV1 94%, FVC 76%, TLC 80%, DLCO 17.1/55%, corrects for alveolar volume   CT abdomen/pelvis 06/26/2018 and 11/26/2019 shows peripheral fibrosis at the lung bases   Chest x-ray 05/2017 shows bilateral interstitial chronic infiltrates   Review of Systems neg for any significant sore throat, dysphagia, itching, sneezing, nasal congestion or excess/ purulent secretions, fever, chills, sweats, unintended wt loss,  pleuritic or exertional cp, hempoptysis, orthopnea pnd or change in chronic leg swelling. Also denies presyncope, palpitations, heartburn, abdominal pain, nausea, vomiting, diarrhea or change in bowel or urinary habits, dysuria,hematuria, rash, arthralgias, visual complaints, headache, numbness weakness or ataxia.     Objective:   Physical Exam   Gen. Pleasant, well-nourished, in no distress ENT - no thrush, no pallor/icterus,no post nasal drip Neck: No JVD, no thyromegaly, no carotid bruits Lungs: no use of accessory muscles, no dullness to percussion, clear without rales or rhonchi  Cardiovascular: Rhythm regular, heart sounds  normal, no murmurs or gallops, no peripheral edema Musculoskeletal: No deformities, no cyanosis or clubbing         Assessment & Plan:

## 2022-01-20 NOTE — Patient Instructions (Signed)
  X amb sat  We decided to hold off on PERFENIDONE

## 2022-01-20 NOTE — Assessment & Plan Note (Signed)
Appears clinically stable from last visit although he does have a progressive phenotype. We discussed alternative antifibrotic pirfenidone and side effect profile but he does not want to try We will reassess in 4 months. We will reimage and follow-up with PFTs on an annual basis

## 2022-01-23 DIAGNOSIS — H25811 Combined forms of age-related cataract, right eye: Secondary | ICD-10-CM | POA: Diagnosis not present

## 2022-01-24 NOTE — H&P (Signed)
Surgical History & Physical  Patient Name: Eric Benitez DOB: [birth year]  Surgery: Cataract extraction with intraocular lens implant phacoemulsification; Right Eye  Surgeon: Baruch Goldmann MD Surgery Date:  02-03-22 Pre-Op Date:  01-23-22  HPI: A 1 Yr. old male patient 1. The patient is returning after cataract surgery. The left eye is affected. Status post cataract surgery, which began 1 weeks ago: Since the last visit, the affected area has a skim that stays over it, constant. Eye feels dry. Patient is following medication instructions: Combo TID OS. 2. 2. Glare problems during the day on bright sunny days and at night when driving due to the cataract OD. This is negatively affecting the patient's quality of life and the patient is unable to function adequately in life with the current level of vision. Patient would like to proceed with cataract sx OD. HPI was performed by Baruch Goldmann .  Medical History: Dry Eyes Cataracts Arthritis Heart Problem High Blood Pressure LDL Lung Problems acid reflux  Review of Systems Cardiovascular High Blood Pressure, A-fib All recorded systems are negative except as noted above.  Social   Former smoker /  Cigarettes   Medication Prednisolone-Moxifloxacin-Bromfenac,  Pantoprazole, Metoprolol, Vitamin B-12, Allergy relief, Ranexa, Atorvastatin, Doxazosin, Lisinopril, Eliquis,   Sx/Procedures Phaco c IOL OS,  Sinus Surgery, Heart shocked,   Drug Allergies   NKDA  History & Physical: Heent: Cataract, right eye NECK: supple without bruits LUNGS: lungs clear to auscultation CV: regular rate and rhythm Abdomen: soft and non-tender Impression & Plan: Assessment: 1.  CATARACT EXTRACTION STATUS; Left Eye (Z98.42) 2.  COMBINED FORMS AGE RELATED CATARACT; Right Eye (H25.811) 3.  Myopia ; Left Eye (H52.12)  Plan: 1.  1 week after cataract surgery. Doing well with improved vision and normal eye pressure. Call with any problems or  concerns. Continue Pred-Moxi-Brom 2x/day for 3 more weeks.  2.  Cataract accounts for the patient's decreased vision. This visual impairment is not correctable with a tolerable change in glasses or contact lenses. Cataract surgery with an implantation of a new lens should significantly improve the visual and functional status of the patient. Discussed all risks, benefits, alternatives, and potential complications. Discussed the procedures and recovery. Patient desires to have surgery. A-scan ordered and performed today for intra-ocular lens calculations. The surgery will be performed in order to improve vision for driving, reading, and for eye examinations. Recommend phacoemulsification with intra-ocular lens. Recommend Dextenza for post-operative pain and inflammation. Right Eye. Surgery required to correct imbalance of vision. Dilates poorly - shugarcaine by protocol. Malyugin Ring. Omidira.  3.

## 2022-01-26 NOTE — Pre-Procedure Instructions (Signed)
Attempted pre-op phone call again. 2 VM have been left for him to call back.

## 2022-01-27 ENCOUNTER — Encounter (HOSPITAL_COMMUNITY)
Admission: RE | Admit: 2022-01-27 | Discharge: 2022-01-27 | Disposition: A | Discharge status: Home / Self Care | Payer: PPO | Source: Ambulatory Visit | Attending: Ophthalmology | Admitting: Ophthalmology

## 2022-02-01 ENCOUNTER — Encounter (HOSPITAL_COMMUNITY): Payer: Self-pay

## 2022-02-03 ENCOUNTER — Ambulatory Visit (HOSPITAL_COMMUNITY): Payer: PPO | Admitting: Anesthesiology

## 2022-02-03 ENCOUNTER — Ambulatory Visit (HOSPITAL_BASED_OUTPATIENT_CLINIC_OR_DEPARTMENT_OTHER): Payer: PPO | Admitting: Anesthesiology

## 2022-02-03 ENCOUNTER — Other Ambulatory Visit: Payer: Self-pay

## 2022-02-03 ENCOUNTER — Encounter (HOSPITAL_COMMUNITY)
Admission: RE | Disposition: A | Discharge status: Home / Self Care | Payer: Self-pay | Source: Home / Self Care | Attending: Ophthalmology

## 2022-02-03 ENCOUNTER — Encounter (HOSPITAL_COMMUNITY): Payer: Self-pay | Admitting: Ophthalmology

## 2022-02-03 ENCOUNTER — Ambulatory Visit (HOSPITAL_COMMUNITY)
Admission: RE | Admit: 2022-02-03 | Discharge: 2022-02-03 | Disposition: A | Discharge status: Home / Self Care | Payer: PPO | Attending: Ophthalmology | Admitting: Ophthalmology

## 2022-02-03 DIAGNOSIS — I2510 Atherosclerotic heart disease of native coronary artery without angina pectoris: Secondary | ICD-10-CM | POA: Diagnosis not present

## 2022-02-03 DIAGNOSIS — Z87891 Personal history of nicotine dependence: Secondary | ICD-10-CM

## 2022-02-03 DIAGNOSIS — I10 Essential (primary) hypertension: Secondary | ICD-10-CM

## 2022-02-03 DIAGNOSIS — Z79899 Other long term (current) drug therapy: Secondary | ICD-10-CM | POA: Diagnosis not present

## 2022-02-03 DIAGNOSIS — M1990 Unspecified osteoarthritis, unspecified site: Secondary | ICD-10-CM | POA: Insufficient documentation

## 2022-02-03 DIAGNOSIS — J449 Chronic obstructive pulmonary disease, unspecified: Secondary | ICD-10-CM | POA: Insufficient documentation

## 2022-02-03 DIAGNOSIS — I25119 Atherosclerotic heart disease of native coronary artery with unspecified angina pectoris: Secondary | ICD-10-CM

## 2022-02-03 DIAGNOSIS — K219 Gastro-esophageal reflux disease without esophagitis: Secondary | ICD-10-CM | POA: Diagnosis not present

## 2022-02-03 DIAGNOSIS — H25811 Combined forms of age-related cataract, right eye: Secondary | ICD-10-CM | POA: Insufficient documentation

## 2022-02-03 DIAGNOSIS — Z955 Presence of coronary angioplasty implant and graft: Secondary | ICD-10-CM | POA: Diagnosis not present

## 2022-02-03 DIAGNOSIS — G4730 Sleep apnea, unspecified: Secondary | ICD-10-CM | POA: Diagnosis not present

## 2022-02-03 HISTORY — PX: CATARACT EXTRACTION W/PHACO: SHX586

## 2022-02-03 SURGERY — PHACOEMULSIFICATION, CATARACT, WITH IOL INSERTION
Anesthesia: Monitor Anesthesia Care | Site: Eye | Laterality: Right

## 2022-02-03 MED ORDER — PHENYLEPHRINE-KETOROLAC 1-0.3 % IO SOLN
INTRAOCULAR | Status: DC | PRN
  Administered 2022-02-03: 500 mL via OPHTHALMIC

## 2022-02-03 MED ORDER — STERILE WATER FOR IRRIGATION IR SOLN
Status: DC | PRN
  Administered 2022-02-03: 250 mL

## 2022-02-03 MED ORDER — SODIUM HYALURONATE 23MG/ML IO SOSY
PREFILLED_SYRINGE | INTRAOCULAR | Status: DC | PRN
  Administered 2022-02-03: .6 mL via INTRAOCULAR

## 2022-02-03 MED ORDER — PHENYLEPHRINE-KETOROLAC 1-0.3 % IO SOLN
INTRAOCULAR | Status: AC
  Filled 2022-02-03: qty 4

## 2022-02-03 MED ORDER — LIDOCAINE HCL (PF) 1 % IJ SOLN
INTRAOCULAR | Status: DC | PRN
  Administered 2022-02-03: 1 mL via OPHTHALMIC

## 2022-02-03 MED ORDER — LIDOCAINE HCL 3.5 % OP GEL
1.0000 | Freq: Once | OPHTHALMIC | Status: AC
  Administered 2022-02-03: 1 via OPHTHALMIC

## 2022-02-03 MED ORDER — PHENYLEPHRINE HCL 2.5 % OP SOLN
1.0000 [drp] | OPHTHALMIC | Status: AC | PRN
  Administered 2022-02-03 (×3): 1 [drp] via OPHTHALMIC

## 2022-02-03 MED ORDER — TETRACAINE HCL 0.5 % OP SOLN
1.0000 [drp] | OPHTHALMIC | Status: AC | PRN
  Administered 2022-02-03 (×3): 1 [drp] via OPHTHALMIC

## 2022-02-03 MED ORDER — EPINEPHRINE PF 1 MG/ML IJ SOLN
INTRAMUSCULAR | Status: AC
  Filled 2022-02-03: qty 2

## 2022-02-03 MED ORDER — MOXIFLOXACIN HCL 5 MG/ML IO SOLN
INTRAOCULAR | Status: AC
  Filled 2022-02-03: qty 1

## 2022-02-03 MED ORDER — MOXIFLOXACIN HCL 0.5 % OP SOLN
OPHTHALMIC | Status: DC | PRN
  Administered 2022-02-03: .2 mL via OPHTHALMIC

## 2022-02-03 MED ORDER — SODIUM HYALURONATE 10 MG/ML IO SOLUTION
PREFILLED_SYRINGE | INTRAOCULAR | Status: DC | PRN
  Administered 2022-02-03: .85 mL via INTRAOCULAR

## 2022-02-03 MED ORDER — MIDAZOLAM HCL 2 MG/2ML IJ SOLN
INTRAMUSCULAR | Status: AC
  Filled 2022-02-03: qty 2

## 2022-02-03 MED ORDER — BSS IO SOLN
INTRAOCULAR | Status: DC | PRN
  Administered 2022-02-03: 15 mL via INTRAOCULAR

## 2022-02-03 MED ORDER — MIDAZOLAM HCL 2 MG/2ML IJ SOLN
INTRAMUSCULAR | Status: DC | PRN
  Administered 2022-02-03: 1 mg via INTRAVENOUS

## 2022-02-03 MED ORDER — POVIDONE-IODINE 5 % OP SOLN
OPHTHALMIC | Status: DC | PRN
  Administered 2022-02-03: 1 via OPHTHALMIC

## 2022-02-03 MED ORDER — TROPICAMIDE 1 % OP SOLN
1.0000 [drp] | OPHTHALMIC | Status: AC | PRN
  Administered 2022-02-03 (×3): 1 [drp] via OPHTHALMIC

## 2022-02-03 SURGICAL SUPPLY — 17 items
0.9mm MicroSmooth Ultra Infusion Sleeve Kit IMPLANT
CATARACT SUITE SIGHTPATH (MISCELLANEOUS) ×1 IMPLANT
CLOTH BEACON ORANGE TIMEOUT ST (SAFETY) ×1 IMPLANT
DRAPE UTILITY W/TAPE 26X15 (DRAPES) IMPLANT
EYE SHIELD UNIVERSAL CLEAR (GAUZE/BANDAGES/DRESSINGS) IMPLANT
FEE CATARACT SUITE SIGHTPATH (MISCELLANEOUS) ×1 IMPLANT
GLOVE BIOGEL PI IND STRL 7.0 (GLOVE) ×2 IMPLANT
LENS IOL RAYNER 21.5 (Intraocular Lens) ×1 IMPLANT
LENS IOL RAYONE EMV 21.5 (Intraocular Lens) IMPLANT
NDL HYPO 18GX1.5 BLUNT FILL (NEEDLE) ×1 IMPLANT
NEEDLE HYPO 18GX1.5 BLUNT FILL (NEEDLE) ×1 IMPLANT
PAD ARMBOARD 7.5X6 YLW CONV (MISCELLANEOUS) ×1 IMPLANT
SIGHTPATH CAT PROC W REG LENS (Ophthalmic Related) IMPLANT
SYR TB 1ML LL NO SAFETY (SYRINGE) ×1 IMPLANT
TAPE SURG TRANSPORE 1 IN (GAUZE/BANDAGES/DRESSINGS) IMPLANT
TAPE SURGICAL TRANSPORE 1 IN (GAUZE/BANDAGES/DRESSINGS) ×1
WATER STERILE IRR 250ML POUR (IV SOLUTION) ×1 IMPLANT

## 2022-02-03 NOTE — Op Note (Signed)
Date of procedure: 02/03/22  Pre-operative diagnosis:  Visually significant combined form age-related cataract, Right Eye (H25.811)  Post-operative diagnosis:  Visually significant combined form age-related cataract, Right Eye (H25.811)  Procedure: Removal of cataract via phacoemulsification and insertion of intra-ocular lens Rayner RAO200E +21.5D into the capsular bag of the Right Eye  Attending surgeon: Gerda Diss. Abanoub Hanken, MD, MA  Anesthesia: MAC, Topical Akten  Complications: None  Estimated Blood Loss: <442m (minimal)  Specimens: None  Implants: As above  Indications:  Visually significant age-related cataract, Right Eye  Procedure:  The patient was seen and identified in the pre-operative area. The operative eye was identified and dilated.  The operative eye was marked.  Topical anesthesia was administered to the operative eye.     The patient was then to the operative suite and placed in the supine position.  A timeout was performed confirming the patient, procedure to be performed, and all other relevant information.   The patient's face was prepped and draped in the usual fashion for intra-ocular surgery.  A lid speculum was placed into the operative eye and the surgical microscope moved into place and focused.  A superotemporal paracentesis was created using a 20 gauge paracentesis blade.  Shugarcaine was injected into the anterior chamber.  Viscoelastic was injected into the anterior chamber.  A temporal clear-corneal main wound incision was created using a 2.454mmicrokeratome.  A continuous curvilinear capsulorrhexis was initiated using an irrigating cystitome and completed using capsulorrhexis forceps.  Hydrodissection and hydrodeliniation were performed.  Viscoelastic was injected into the anterior chamber.  A phacoemulsification handpiece and a chopper as a second instrument were used to remove the nucleus and epinucleus. The irrigation/aspiration handpiece was used to remove any  remaining cortical material.   The capsular bag was reinflated with viscoelastic, checked, and found to be intact.  The intraocular lens was inserted into the capsular bag.  The irrigation/aspiration handpiece was used to remove any remaining viscoelastic.  The clear corneal wound and paracentesis wounds were then hydrated and checked with Weck-Cels to be watertight. 0.42m69mf moxifloxacin was injected into the anterior chamber. The lid-speculum was removed.  The drape was removed.  The patient's face was cleaned with a wet and dry 4x4. A clear shield was taped over the eye. The patient was taken to the post-operative care unit in good condition, having tolerated the procedure well.  Post-Op Instructions: The [facility] post[facility]operative evaluation and will receive all other orders and instructions.

## 2022-02-03 NOTE — Transfer of Care (Signed)
Immediate Anesthesia Transfer of Care Note  Patient: Eric Benitez  Procedure(s) Performed: CATARACT EXTRACTION PHACO AND INTRAOCULAR LENS PLACEMENT (IOC) (Right: Eye)  Patient Location: Short Stay  Anesthesia Type:MAC  Level of Consciousness: awake, alert , and oriented  Airway & Oxygen Therapy: Patient Spontanous Breathing  Post-op Assessment: Report given to RN and Post -op Vital signs reviewed and stable  Post vital signs: Reviewed and stable  Last Vitals:  Vitals Value Taken Time  BP 166/83 02/03/22 0903  Temp 36.6 C 02/03/22 0903  Pulse 62 02/03/22 0903  Resp 15 02/03/22 0903  SpO2 99 % 02/03/22 0903    Last Pain:  Vitals:   02/03/22 0903  TempSrc: Oral  PainSc: 0-No pain         Complications: No notable events documented.

## 2022-02-03 NOTE — Anesthesia Postprocedure Evaluation (Signed)
Anesthesia Post Note  Patient: Eric Benitez  Procedure(s) Performed: CATARACT EXTRACTION PHACO AND INTRAOCULAR LENS PLACEMENT (IOC) (Right: Eye)  Patient location during evaluation: Phase II Anesthesia Type: MAC Level of consciousness: awake and alert and oriented Pain management: pain level controlled Vital Signs Assessment: post-procedure vital signs reviewed and stable Respiratory status: spontaneous breathing, nonlabored ventilation and respiratory function stable Cardiovascular status: blood pressure returned to baseline and stable Postop Assessment: no apparent nausea or vomiting Anesthetic complications: no  No notable events documented.   Last Vitals:  Vitals:   02/03/22 0700 02/03/22 0903  BP: (!) 152/71 (!) 166/83  Pulse:  62  Resp:  15  Temp:  36.6 C  SpO2:  99%    Last Pain:  Vitals:   02/03/22 0903  TempSrc: Oral  PainSc: 0-No pain                 Carole Doner C Karrah Mangini

## 2022-02-03 NOTE — Interval H&P Note (Signed)
History and Physical Interval Note:  02/03/2022 8:29 AM  Auther D Altice  has presented today for surgery, with the diagnosis of combined forms age related cataract; right.  The various methods of treatment have been discussed with the patient and family. After consideration of risks, benefits and other options for treatment, the patient has consented to  Procedure(s): CATARACT EXTRACTION PHACO AND INTRAOCULAR LENS PLACEMENT ([place]) (Right) as a surgical intervention.  The patient's history has been reviewed, patient examined, no change in status, stable for surgery.  I have reviewed the patient's chart and labs.  Questions were answered to the patient's satisfaction.     Baruch Goldmann

## 2022-02-03 NOTE — Anesthesia Preprocedure Evaluation (Addendum)
Anesthesia Evaluation  Patient identified by MRN, date of birth, ID band Patient awake    Reviewed: Allergy & Precautions, NPO status , Patient's Chart, lab work & pertinent test results, reviewed documented beta blocker date and time   Airway Mallampati: II  TM Distance: >3 FB Neck ROM: Full    Dental  (+) Dental Advisory Given, Upper Dentures   Pulmonary asthma , sleep apnea , pneumonia, COPD,  COPD inhaler, former smoker   Pulmonary exam normal breath sounds clear to auscultation       Cardiovascular Exercise Tolerance: Good hypertension, Pt. on medications and Pt. on home beta blockers + angina  + CAD and + Cardiac Stents  Normal cardiovascular exam Rhythm:Regular Rate:Normal     Neuro/Psych negative neurological ROS  negative psych ROS   GI/Hepatic Neg liver ROS, PUD,GERD  Medicated and Controlled,,  Endo/Other  negative endocrine ROS    Renal/GU negative Renal ROS     Musculoskeletal  (+) Arthritis , Osteoarthritis,    Abdominal   Peds  Hematology  (+) Blood dyscrasia, anemia   Anesthesia Other Findings   Reproductive/Obstetrics                             Anesthesia Physical Anesthesia Plan  ASA: 3  Anesthesia Plan: MAC   Post-op Pain Management: Minimal or no pain anticipated   Induction: Intravenous  PONV Risk Score and Plan: Treatment may vary due to age or medical condition  Airway Management Planned: Nasal Cannula and Natural Airway  Additional Equipment:   Intra-op Plan:   Post-operative Plan:   Informed Consent: I have reviewed the patients History and Physical, chart, labs and discussed the procedure including the risks, benefits and alternatives for the proposed anesthesia with the patient or authorized representative who has indicated his/her understanding and acceptance.     Dental advisory given  Plan Discussed with: CRNA and Surgeon  Anesthesia Plan  Comments:        Anesthesia Quick Evaluation

## 2022-02-03 NOTE — Discharge Instructions (Addendum)
Please discharge patient when stable, will follow up today with Dr. Wrzosek at the [facility] [place] office immediately following discharge.  Leave shield in place until visit.  All paperwork with discharge instructions will be given at the office.  [facility] Address:  [address]  [place], [place] [address]  

## 2022-02-13 ENCOUNTER — Encounter (HOSPITAL_COMMUNITY): Payer: Self-pay | Admitting: Ophthalmology

## 2022-03-08 DIAGNOSIS — I10 Essential (primary) hypertension: Secondary | ICD-10-CM | POA: Diagnosis not present

## 2022-03-08 DIAGNOSIS — E1169 Type 2 diabetes mellitus with other specified complication: Secondary | ICD-10-CM | POA: Diagnosis not present

## 2022-04-11 DIAGNOSIS — Z1329 Encounter for screening for other suspected endocrine disorder: Secondary | ICD-10-CM | POA: Diagnosis not present

## 2022-04-11 DIAGNOSIS — E7849 Other hyperlipidemia: Secondary | ICD-10-CM | POA: Diagnosis not present

## 2022-04-11 DIAGNOSIS — K2100 Gastro-esophageal reflux disease with esophagitis, without bleeding: Secondary | ICD-10-CM | POA: Diagnosis not present

## 2022-04-11 DIAGNOSIS — J449 Chronic obstructive pulmonary disease, unspecified: Secondary | ICD-10-CM | POA: Diagnosis not present

## 2022-04-11 DIAGNOSIS — I10 Essential (primary) hypertension: Secondary | ICD-10-CM | POA: Diagnosis not present

## 2022-04-11 DIAGNOSIS — E7801 Familial hypercholesterolemia: Secondary | ICD-10-CM | POA: Diagnosis not present

## 2022-04-18 DIAGNOSIS — N401 Enlarged prostate with lower urinary tract symptoms: Secondary | ICD-10-CM | POA: Diagnosis not present

## 2022-04-18 DIAGNOSIS — I25119 Atherosclerotic heart disease of native coronary artery with unspecified angina pectoris: Secondary | ICD-10-CM | POA: Diagnosis not present

## 2022-04-18 DIAGNOSIS — D649 Anemia, unspecified: Secondary | ICD-10-CM | POA: Diagnosis not present

## 2022-04-18 DIAGNOSIS — E7849 Other hyperlipidemia: Secondary | ICD-10-CM | POA: Diagnosis not present

## 2022-04-18 DIAGNOSIS — K2100 Gastro-esophageal reflux disease with esophagitis, without bleeding: Secondary | ICD-10-CM | POA: Diagnosis not present

## 2022-04-18 DIAGNOSIS — D6869 Other thrombophilia: Secondary | ICD-10-CM | POA: Diagnosis not present

## 2022-04-18 DIAGNOSIS — I482 Chronic atrial fibrillation, unspecified: Secondary | ICD-10-CM | POA: Diagnosis not present

## 2022-04-18 DIAGNOSIS — I10 Essential (primary) hypertension: Secondary | ICD-10-CM | POA: Diagnosis not present

## 2022-04-18 DIAGNOSIS — Z6825 Body mass index (BMI) 25.0-25.9, adult: Secondary | ICD-10-CM | POA: Diagnosis not present

## 2022-06-06 DIAGNOSIS — I10 Essential (primary) hypertension: Secondary | ICD-10-CM | POA: Diagnosis not present

## 2022-06-06 DIAGNOSIS — E782 Mixed hyperlipidemia: Secondary | ICD-10-CM | POA: Diagnosis not present

## 2022-06-09 DIAGNOSIS — R6889 Other general symptoms and signs: Secondary | ICD-10-CM | POA: Diagnosis not present

## 2022-06-09 DIAGNOSIS — R509 Fever, unspecified: Secondary | ICD-10-CM | POA: Diagnosis not present

## 2022-06-09 DIAGNOSIS — Z6824 Body mass index (BMI) 24.0-24.9, adult: Secondary | ICD-10-CM | POA: Diagnosis not present

## 2022-06-09 DIAGNOSIS — J441 Chronic obstructive pulmonary disease with (acute) exacerbation: Secondary | ICD-10-CM | POA: Diagnosis not present

## 2022-06-09 DIAGNOSIS — R030 Elevated blood-pressure reading, without diagnosis of hypertension: Secondary | ICD-10-CM | POA: Diagnosis not present

## 2022-06-09 DIAGNOSIS — Z20822 Contact with and (suspected) exposure to covid-19: Secondary | ICD-10-CM | POA: Diagnosis not present

## 2022-06-09 DIAGNOSIS — R062 Wheezing: Secondary | ICD-10-CM | POA: Diagnosis not present

## 2022-06-13 ENCOUNTER — Emergency Department (HOSPITAL_COMMUNITY)
Admission: EM | Admit: 2022-06-13 | Discharge: 2022-06-13 | Disposition: A | Discharge status: Home / Self Care | Payer: PPO | Attending: Emergency Medicine | Admitting: Emergency Medicine

## 2022-06-13 ENCOUNTER — Emergency Department (HOSPITAL_COMMUNITY): Payer: PPO

## 2022-06-13 ENCOUNTER — Encounter (HOSPITAL_COMMUNITY): Payer: Self-pay | Admitting: Emergency Medicine

## 2022-06-13 ENCOUNTER — Other Ambulatory Visit: Payer: Self-pay

## 2022-06-13 DIAGNOSIS — J449 Chronic obstructive pulmonary disease, unspecified: Secondary | ICD-10-CM | POA: Diagnosis not present

## 2022-06-13 DIAGNOSIS — I10 Essential (primary) hypertension: Secondary | ICD-10-CM | POA: Insufficient documentation

## 2022-06-13 DIAGNOSIS — Z7901 Long term (current) use of anticoagulants: Secondary | ICD-10-CM | POA: Insufficient documentation

## 2022-06-13 DIAGNOSIS — R0789 Other chest pain: Secondary | ICD-10-CM | POA: Insufficient documentation

## 2022-06-13 DIAGNOSIS — S2231XA Fracture of one rib, right side, initial encounter for closed fracture: Secondary | ICD-10-CM | POA: Diagnosis not present

## 2022-06-13 DIAGNOSIS — I509 Heart failure, unspecified: Secondary | ICD-10-CM | POA: Diagnosis not present

## 2022-06-13 DIAGNOSIS — I2510 Atherosclerotic heart disease of native coronary artery without angina pectoris: Secondary | ICD-10-CM | POA: Diagnosis not present

## 2022-06-13 DIAGNOSIS — R0602 Shortness of breath: Secondary | ICD-10-CM | POA: Insufficient documentation

## 2022-06-13 LAB — CBC WITH DIFFERENTIAL/PLATELET
Abs Immature Granulocytes: 0.08 10*3/uL — ABNORMAL HIGH (ref 0.00–0.07)
Basophils Absolute: 0 10*3/uL (ref 0.0–0.1)
Basophils Relative: 1 %
Eosinophils Absolute: 0.2 10*3/uL (ref 0.0–0.5)
Eosinophils Relative: 2 %
HCT: 37.9 % — ABNORMAL LOW (ref 39.0–52.0)
Hemoglobin: 13 g/dL (ref 13.0–17.0)
Immature Granulocytes: 1 %
Lymphocytes Relative: 28 %
Lymphs Abs: 2.1 10*3/uL (ref 0.7–4.0)
MCH: 32.3 pg (ref 26.0–34.0)
MCHC: 34.3 g/dL (ref 30.0–36.0)
MCV: 94.3 fL (ref 80.0–100.0)
Monocytes Absolute: 0.8 10*3/uL (ref 0.1–1.0)
Monocytes Relative: 10 %
Neutro Abs: 4.4 10*3/uL (ref 1.7–7.7)
Neutrophils Relative %: 58 %
Platelets: 238 10*3/uL (ref 150–400)
RBC: 4.02 MIL/uL — ABNORMAL LOW (ref 4.22–5.81)
RDW: 13.3 % (ref 11.5–15.5)
WBC: 7.5 10*3/uL (ref 4.0–10.5)
nRBC: 0 % (ref 0.0–0.2)

## 2022-06-13 LAB — COMPREHENSIVE METABOLIC PANEL
ALT: 23 U/L (ref 0–44)
AST: 26 U/L (ref 15–41)
Albumin: 3.7 g/dL (ref 3.5–5.0)
Alkaline Phosphatase: 70 U/L (ref 38–126)
Anion gap: 11 (ref 5–15)
BUN: 18 mg/dL (ref 8–23)
CO2: 21 mmol/L — ABNORMAL LOW (ref 22–32)
Calcium: 9 mg/dL (ref 8.9–10.3)
Chloride: 101 mmol/L (ref 98–111)
Creatinine, Ser: 1.07 mg/dL (ref 0.61–1.24)
GFR, Estimated: >60 mL/min (ref >60)
Glucose, Bld: 106 mg/dL — ABNORMAL HIGH (ref 70–99)
Potassium: 4.3 mmol/L (ref 3.5–5.1)
Sodium: 133 mmol/L — ABNORMAL LOW (ref 135–145)
Total Bilirubin: 0.8 mg/dL (ref 0.3–1.2)
Total Protein: 8.1 g/dL (ref 6.5–8.1)

## 2022-06-13 LAB — BRAIN NATRIURETIC PEPTIDE: B Natriuretic Peptide: 529 pg/mL — ABNORMAL HIGH (ref 0.0–100.0)

## 2022-06-13 LAB — TROPONIN I (HIGH SENSITIVITY)
Troponin I (High Sensitivity): 10 ng/L (ref <18)
Troponin I (High Sensitivity): 11 ng/L (ref <18)

## 2022-06-13 MED ORDER — FUROSEMIDE 20 MG PO TABS: 40.0000 mg | ORAL_TABLET | Freq: Every day | ORAL | 0 refills | Status: DC

## 2022-06-13 MED ORDER — FUROSEMIDE 10 MG/ML IJ SOLN
40.0000 mg | Freq: Once | INTRAMUSCULAR | Status: AC
  Administered 2022-06-13: 40 mg via INTRAVENOUS
  Filled 2022-06-13: qty 4

## 2022-06-13 NOTE — Discharge Instructions (Signed)
Please be sure to take 40 mg of Lasix daily and monitor your condition carefully.  Follow-up with your cardiologist as scheduled next week.  Return here for concerning changes in your condition.

## 2022-06-13 NOTE — ED Provider Notes (Signed)
[place] EMERGENCY DEPARTMENT AT [facility] Provider Note   CSN: [id number] Arrival date & time: 06/13/22  4098     History  Chief Complaint  Patient presents with   Chest Pain   Shortness of Breath    Eric Benitez is a 82 y.o. male.  HPI Patient presents with his wife who assists with the history.  Patient has a history of respiratory disease, has been feeling dyspneic for about 8 days, but has become worse over the past few days in spite of seeing his physician 4 days ago, starting azithromycin, albuterol.  He notes his symptoms are worse with activity, has had occasional chest tightness as well, but no fever, nausea, vomiting, syncope.    Home Medications Prior to Admission medications   Medication Sig Start Date End Date Taking? Authorizing Provider  furosemide (LASIX) 20 MG tablet Take 2 tablets (40 mg total) by mouth daily for 7 days. 06/13/22 06/20/22 Yes Gerhard Munch, MD  albuterol (VENTOLIN HFA) 108 (90 Base) MCG/ACT inhaler Inhale into the lungs.    [provider]  apixaban (ELIQUIS) 5 MG TABS tablet Take 1 tablet (5 mg total) by mouth every 12 (twelve) hours. 06/07/18   Bhagat, Sharrell Ku, PA  atorvastatin (LIPITOR) 80 MG tablet Take 80 mg by mouth at bedtime.     [provider]  doxazosin (CARDURA) 8 MG tablet Take 8 mg by mouth at bedtime.     [provider]  fluticasone (FLONASE) 50 MCG/ACT nasal spray Place 2 sprays into the nose daily as needed (allergies).    [provider]  metoprolol succinate (TOPROL-XL) 25 MG 24 hr tablet Take 25 mg by mouth daily.     [provider]  Nintedanib (OFEV) 150 MG CAPS Take 1 capsule (150 mg total) by mouth 2 (two) times daily. Patient not taking: Reported on 01/20/2022 08/29/21   Oretha Milch, MD  nitroGLYCERIN (NITROSTAT) 0.4 MG SL tablet PLACE 1 TABLET (0.4 MG TOTAL) UNDER THE TONGUE EVERY 5 (FIVE) MINUTES AS NEEDED FOR CHEST PAIN. 06/29/17   Antoine Poche, MD   pantoprazole (PROTONIX) 40 MG tablet TAKE 1 TABLET BY MOUTH DAILY 11/10/15   Antoine Poche, MD  potassium chloride (KLOR-CON) 10 MEQ tablet Take 1 tablet (10 mEq total) by mouth daily. 07/25/21   Dyann Kief, PA-C  ranolazine (RANEXA) 500 MG 12 hr tablet Take 500 mg by mouth 2 (two) times daily.  12/31/17   [provider]      Allergies    Vancomycin    Review of Systems   Review of Systems  All other systems reviewed and are negative.   Physical Exam Updated Vital Signs BP 104/73   Pulse 100   Temp 98.1 F (36.7 C) (Oral)   Resp 15   SpO2 99%  Physical Exam Vitals and nursing note reviewed.  Constitutional:      General: He is not in acute distress.    Appearance: He is well-developed.  HENT:     Head: Normocephalic and atraumatic.  Eyes:     Conjunctiva/sclera: Conjunctivae normal.  Cardiovascular:     Rate and Rhythm: Normal rate and regular rhythm.  Pulmonary:     Effort: Pulmonary effort is normal. No respiratory distress.     Breath sounds: No stridor.  Abdominal:     General: There is no distension.  Skin:    General: Skin is warm and dry.  Neurological:     Mental Status: He  is alert and oriented to person, place, and time.     ED Results / Procedures / Treatments   Labs (all labs ordered are listed, but only abnormal results are displayed) Labs Reviewed  CBC WITH DIFFERENTIAL/PLATELET - Abnormal; Notable for the following components:      Result Value   RBC 4.02 (*)    HCT 37.9 (*)    Abs Immature Granulocytes 0.08 (*)    All other components within normal limits  COMPREHENSIVE METABOLIC PANEL - Abnormal; Notable for the following components:   Sodium 133 (*)    CO2 21 (*)    Glucose, Bld 106 (*)    All other components within normal limits  BRAIN NATRIURETIC PEPTIDE - Abnormal; Notable for the following components:   B Natriuretic Peptide 529.0 (*)    All other components within normal limits  TROPONIN I (HIGH SENSITIVITY)   TROPONIN I (HIGH SENSITIVITY)    EKG EKG Interpretation  Date/Time:  Tuesday Jun 13 2022 10:04:48 EDT Ventricular Rate:  87 PR Interval:    QRS Duration: 80 QT Interval:  363 QTC Calculation: 437 R Axis:   32 Text Interpretation: Atrial fibrillation Abnormal ECG Confirmed by Gerhard Munch [phone]) on 06/13/2022 10:28:46 AM  Radiology DG Chest 2 View  Result Date: 06/13/2022 CLINICAL DATA:  Shortness of breath, upper chest tightness, history lung disease, CHF, COPD, former smoker EXAM: CHEST - 2 VIEW COMPARISON:  10/04/2021 FINDINGS: Upper normal heart size. Mediastinal contours and pulmonary vascularity normal. COPD changes with chronic interstitial lung disease changes involving the mid to lower lungs bilaterally similar to previous exam. No definite superimposed infiltrate, pleural effusion, or pneumothorax. Multiple old RIGHT rib fractures. IMPRESSION: Changes of COPD and chronic interstitial lung disease. No acute abnormalities. Electronically Signed   By: Ulyses Southward M.D.   On: 06/13/2022 10:49    Procedures Procedures    Medications Ordered in ED Medications  furosemide (LASIX) injection 40 mg (40 mg Intravenous Given 06/13/22 1123)    ED Course/ Medical Decision Making/ A&P                             Medical Decision Making Patient with a history of CAD, hyperlipidemia, hypertension, A-fib, bronchitis presents with dyspnea, chest pain.  Broad differential including respiratory, pulmonary etiologies, though his initial vital signs are reassuring, physical exam reassuring. He notes compliant with his medications and PE, less likely initially.  On monitor the patient has A-fib, rate 80s, abnormal Pulse ox 99% room air normal   Amount and/or Complexity of Data Reviewed Independent Historian: spouse Labs: ordered. Decision-making details documented in ED Course. Radiology: ordered and independent interpretation performed. Decision-making details documented in ED  Course. ECG/medicine tests: ordered and independent interpretation performed. Decision-making details documented in ED Course.  Risk Prescription drug management. Decision regarding hospitalization.   2:06 PM Patient with substantial diarrhea this.  I discussed findings again with the patient and his wife.  BNP elevated 5 times beyond his most recent value, though his echocardiogram from last year was reviewed, with results below, there are some suspicion for worsening heart failure and/or an adequate diuretic dosing. No evidence for pneumonia, ACS, with 2 normal troponin, nonischemic EKG.  Patient is improved here, notes that he has a follow-up with cardiology scheduled in 1 week.  He will take increased Lasix dosing for that week, discussed with cardiology on follow-up.         Final Clinical Impression(s) /  ED Diagnoses Final diagnoses:  Shortness of breath    Rx / DC Orders ED Discharge Orders          Ordered    furosemide (LASIX) 20 MG tablet  Daily        06/13/22 1405              Gerhard Munch, MD 06/13/22 1406

## 2022-06-13 NOTE — ED Triage Notes (Signed)
Pt c/o sob and upper chest tightness across. Pt hx of lung disease. Saw pcp Friday and was given abx. Pt states sob much worse today. Pt ambulatory with no obvious sob/resp distress noted. Color wnl. Non diaphoretic. A/o.

## 2022-06-14 ENCOUNTER — Ambulatory Visit: Payer: PPO | Admitting: Internal Medicine

## 2022-06-15 ENCOUNTER — Emergency Department (HOSPITAL_COMMUNITY): Payer: PPO

## 2022-06-15 ENCOUNTER — Other Ambulatory Visit: Payer: Self-pay

## 2022-06-15 ENCOUNTER — Encounter (HOSPITAL_COMMUNITY): Payer: Self-pay

## 2022-06-15 ENCOUNTER — Observation Stay (HOSPITAL_COMMUNITY)
Admission: EM | Admit: 2022-06-15 | Discharge: 2022-06-17 | Disposition: A | Discharge status: Home / Self Care | Payer: PPO | Attending: Internal Medicine | Admitting: Internal Medicine

## 2022-06-15 DIAGNOSIS — I200 Unstable angina: Secondary | ICD-10-CM | POA: Diagnosis present

## 2022-06-15 DIAGNOSIS — R0789 Other chest pain: Secondary | ICD-10-CM | POA: Diagnosis not present

## 2022-06-15 DIAGNOSIS — E782 Mixed hyperlipidemia: Secondary | ICD-10-CM | POA: Insufficient documentation

## 2022-06-15 DIAGNOSIS — N179 Acute kidney failure, unspecified: Secondary | ICD-10-CM

## 2022-06-15 DIAGNOSIS — R0602 Shortness of breath: Secondary | ICD-10-CM | POA: Diagnosis not present

## 2022-06-15 DIAGNOSIS — I25110 Atherosclerotic heart disease of native coronary artery with unstable angina pectoris: Principal | ICD-10-CM | POA: Insufficient documentation

## 2022-06-15 DIAGNOSIS — I110 Hypertensive heart disease with heart failure: Secondary | ICD-10-CM | POA: Diagnosis not present

## 2022-06-15 DIAGNOSIS — Z79899 Other long term (current) drug therapy: Secondary | ICD-10-CM | POA: Insufficient documentation

## 2022-06-15 DIAGNOSIS — J449 Chronic obstructive pulmonary disease, unspecified: Secondary | ICD-10-CM | POA: Diagnosis not present

## 2022-06-15 DIAGNOSIS — Z7901 Long term (current) use of anticoagulants: Secondary | ICD-10-CM | POA: Diagnosis not present

## 2022-06-15 DIAGNOSIS — I480 Paroxysmal atrial fibrillation: Secondary | ICD-10-CM | POA: Insufficient documentation

## 2022-06-15 DIAGNOSIS — I5032 Chronic diastolic (congestive) heart failure: Secondary | ICD-10-CM | POA: Insufficient documentation

## 2022-06-15 DIAGNOSIS — I10 Essential (primary) hypertension: Secondary | ICD-10-CM | POA: Diagnosis present

## 2022-06-15 DIAGNOSIS — N289 Disorder of kidney and ureter, unspecified: Secondary | ICD-10-CM | POA: Insufficient documentation

## 2022-06-15 DIAGNOSIS — R079 Chest pain, unspecified: Secondary | ICD-10-CM

## 2022-06-15 DIAGNOSIS — Z1152 Encounter for screening for COVID-19: Secondary | ICD-10-CM | POA: Insufficient documentation

## 2022-06-15 DIAGNOSIS — J84112 Idiopathic pulmonary fibrosis: Secondary | ICD-10-CM | POA: Diagnosis present

## 2022-06-15 DIAGNOSIS — Z87891 Personal history of nicotine dependence: Secondary | ICD-10-CM | POA: Diagnosis not present

## 2022-06-15 DIAGNOSIS — E785 Hyperlipidemia, unspecified: Secondary | ICD-10-CM | POA: Diagnosis present

## 2022-06-15 DIAGNOSIS — I2510 Atherosclerotic heart disease of native coronary artery without angina pectoris: Secondary | ICD-10-CM | POA: Diagnosis present

## 2022-06-15 LAB — COMPREHENSIVE METABOLIC PANEL
ALT: 23 U/L (ref 0–44)
AST: 24 U/L (ref 15–41)
Albumin: 3.5 g/dL (ref 3.5–5.0)
Alkaline Phosphatase: 66 U/L (ref 38–126)
Anion gap: 11 (ref 5–15)
BUN: 24 mg/dL — ABNORMAL HIGH (ref 8–23)
CO2: 21 mmol/L — ABNORMAL LOW (ref 22–32)
Calcium: 8.9 mg/dL (ref 8.9–10.3)
Chloride: 97 mmol/L — ABNORMAL LOW (ref 98–111)
Creatinine, Ser: 1.28 mg/dL — ABNORMAL HIGH (ref 0.61–1.24)
GFR, Estimated: 56 mL/min — ABNORMAL LOW (ref >60)
Glucose, Bld: 121 mg/dL — ABNORMAL HIGH (ref 70–99)
Potassium: 4.2 mmol/L (ref 3.5–5.1)
Sodium: 129 mmol/L — ABNORMAL LOW (ref 135–145)
Total Bilirubin: 0.5 mg/dL (ref 0.3–1.2)
Total Protein: 7.5 g/dL (ref 6.5–8.1)

## 2022-06-15 LAB — CBC WITH DIFFERENTIAL/PLATELET
Abs Immature Granulocytes: 0.1 10*3/uL — ABNORMAL HIGH (ref 0.00–0.07)
Basophils Absolute: 0 10*3/uL (ref 0.0–0.1)
Basophils Relative: 0 %
Eosinophils Absolute: 0.1 10*3/uL (ref 0.0–0.5)
Eosinophils Relative: 2 %
HCT: 36.8 % — ABNORMAL LOW (ref 39.0–52.0)
Hemoglobin: 12.6 g/dL — ABNORMAL LOW (ref 13.0–17.0)
Immature Granulocytes: 1 %
Lymphocytes Relative: 28 %
Lymphs Abs: 2.5 10*3/uL (ref 0.7–4.0)
MCH: 32.2 pg (ref 26.0–34.0)
MCHC: 34.2 g/dL (ref 30.0–36.0)
MCV: 94.1 fL (ref 80.0–100.0)
Monocytes Absolute: 0.8 10*3/uL (ref 0.1–1.0)
Monocytes Relative: 9 %
Neutro Abs: 5.5 10*3/uL (ref 1.7–7.7)
Neutrophils Relative %: 60 %
Platelets: 275 10*3/uL (ref 150–400)
RBC: 3.91 MIL/uL — ABNORMAL LOW (ref 4.22–5.81)
RDW: 13.2 % (ref 11.5–15.5)
WBC: 9.1 10*3/uL (ref 4.0–10.5)
nRBC: 0 % (ref 0.0–0.2)

## 2022-06-15 LAB — SARS CORONAVIRUS 2 BY RT PCR: SARS Coronavirus 2 by RT PCR: NEGATIVE

## 2022-06-15 LAB — TROPONIN I (HIGH SENSITIVITY)
Troponin I (High Sensitivity): 16 ng/L (ref <18)
Troponin I (High Sensitivity): 17 ng/L (ref <18)

## 2022-06-15 LAB — BRAIN NATRIURETIC PEPTIDE: B Natriuretic Peptide: 167 pg/mL — ABNORMAL HIGH (ref 0.0–100.0)

## 2022-06-15 MED ORDER — PANTOPRAZOLE SODIUM 40 MG PO TBEC
40.0000 mg | DELAYED_RELEASE_TABLET | Freq: Every day | ORAL | Status: DC
  Administered 2022-06-16 – 2022-06-17 (×2): 40 mg via ORAL
  Filled 2022-06-15 (×2): qty 1

## 2022-06-15 MED ORDER — ASPIRIN 81 MG PO CHEW
81.0000 mg | CHEWABLE_TABLET | Freq: Every day | ORAL | Status: DC
  Administered 2022-06-16 – 2022-06-17 (×2): 81 mg via ORAL
  Filled 2022-06-15 (×2): qty 1

## 2022-06-15 MED ORDER — ENOXAPARIN SODIUM 40 MG/0.4ML IJ SOSY
40.0000 mg | PREFILLED_SYRINGE | INTRAMUSCULAR | Status: DC
  Administered 2022-06-15: 40 mg via SUBCUTANEOUS
  Filled 2022-06-15: qty 0.4

## 2022-06-15 MED ORDER — ONDANSETRON HCL 4 MG/2ML IJ SOLN: 4.0000 mg | Freq: Four times a day (QID) | INTRAMUSCULAR | Status: DC | PRN

## 2022-06-15 MED ORDER — MORPHINE SULFATE (PF) 4 MG/ML IV SOLN
4.0000 mg | Freq: Once | INTRAVENOUS | Status: AC
  Administered 2022-06-15: 4 mg via INTRAVENOUS
  Filled 2022-06-15: qty 1

## 2022-06-15 MED ORDER — IPRATROPIUM-ALBUTEROL 0.5-2.5 (3) MG/3ML IN SOLN
3.0000 mL | Freq: Once | RESPIRATORY_TRACT | Status: AC
  Administered 2022-06-15: 3 mL via RESPIRATORY_TRACT
  Filled 2022-06-15: qty 3

## 2022-06-15 MED ORDER — RANOLAZINE ER 500 MG PO TB12
500.0000 mg | ORAL_TABLET | Freq: Two times a day (BID) | ORAL | Status: DC
  Administered 2022-06-15 – 2022-06-17 (×4): 500 mg via ORAL
  Filled 2022-06-15 (×5): qty 1

## 2022-06-15 MED ORDER — SENNOSIDES-DOCUSATE SODIUM 8.6-50 MG PO TABS
1.0000 | ORAL_TABLET | Freq: Every evening | ORAL | Status: DC | PRN
  Administered 2022-06-17: 1 via ORAL
  Filled 2022-06-15: qty 1

## 2022-06-15 MED ORDER — ATORVASTATIN CALCIUM 80 MG PO TABS
80.0000 mg | ORAL_TABLET | Freq: Every day | ORAL | Status: DC
  Administered 2022-06-15 – 2022-06-16 (×2): 80 mg via ORAL
  Filled 2022-06-15: qty 2
  Filled 2022-06-15: qty 1

## 2022-06-15 MED ORDER — ASPIRIN 81 MG PO CHEW
324.0000 mg | CHEWABLE_TABLET | Freq: Once | ORAL | Status: AC
  Administered 2022-06-15: 324 mg via ORAL
  Filled 2022-06-15: qty 4

## 2022-06-15 MED ORDER — ALBUTEROL SULFATE HFA 108 (90 BASE) MCG/ACT IN AERS: 1.0000 | INHALATION_SPRAY | RESPIRATORY_TRACT | Status: DC | PRN

## 2022-06-15 MED ORDER — NITROGLYCERIN 2 % TD OINT
0.5000 [in_us] | TOPICAL_OINTMENT | Freq: Once | TRANSDERMAL | Status: AC
  Administered 2022-06-15: 0.5 [in_us] via TOPICAL
  Filled 2022-06-15: qty 1

## 2022-06-15 MED ORDER — FLUTICASONE PROPIONATE 50 MCG/ACT NA SUSP: 2.0000 | Freq: Every day | NASAL | Status: DC | PRN

## 2022-06-15 MED ORDER — DOXAZOSIN MESYLATE 8 MG PO TABS
8.0000 mg | ORAL_TABLET | Freq: Every day | ORAL | Status: DC
  Administered 2022-06-15 – 2022-06-16 (×2): 8 mg via ORAL
  Filled 2022-06-15: qty 1
  Filled 2022-06-15: qty 4
  Filled 2022-06-15: qty 1

## 2022-06-15 MED ORDER — MORPHINE SULFATE (PF) 4 MG/ML IV SOLN: 4.0000 mg | INTRAVENOUS | Status: DC | PRN

## 2022-06-15 MED ORDER — ALBUTEROL SULFATE (2.5 MG/3ML) 0.083% IN NEBU: 2.5000 mg | INHALATION_SOLUTION | RESPIRATORY_TRACT | Status: DC | PRN

## 2022-06-15 MED ORDER — ACETAMINOPHEN 325 MG PO TABS
650.0000 mg | ORAL_TABLET | ORAL | Status: DC | PRN
  Administered 2022-06-15: 650 mg via ORAL
  Filled 2022-06-15: qty 2

## 2022-06-15 MED ORDER — METOPROLOL SUCCINATE ER 25 MG PO TB24
25.0000 mg | ORAL_TABLET | Freq: Every day | ORAL | Status: DC
  Administered 2022-06-16 – 2022-06-17 (×2): 25 mg via ORAL
  Filled 2022-06-15 (×2): qty 1

## 2022-06-15 MED ORDER — NITROGLYCERIN 2 % TD OINT: 0.5000 [in_us] | TOPICAL_OINTMENT | Freq: Four times a day (QID) | TRANSDERMAL | Status: DC | PRN

## 2022-06-15 NOTE — H&P (Signed)
History and Physical    Patient: Eric Benitez DOB: 02-05-1941 DOA: 06/15/2022 DOS: the patient was seen and examined on 06/15/2022 PCP: Richardean Chimera, MD  Patient coming from: Home  Chief Complaint:  Chief Complaint  Patient presents with   Chest Pain   Shortness of Breath   HPI: Eric Benitez is an 82 y.o. male with a history of IPF, CAD s/p LAD stent, HTN, HLD who presented to the ED today with worsening exertional chest tightness. He reports about a week of noticing pressure/heaviness in the central chest radiating to the left side associated with left neck pain worse with some exertion. This worsened despite antibiotics prescribed by PCP. The shortness of breath with exertion is chronic for years and slowly progressing, though worsened significant in this past week until he started taking more lasix as prescribed by the ED. This morning he had this same tightness/pain when getting up and walking around the kitchen, it is "the exact" pain he had before MI previously so he opted to present to the ED. Here they gave nitroglycerin and morphine. That, in addition to stopping exertion seems to have helped the tightness. He has no dyspnea at rest. Has a headache from the nitro. Took medications including eliquis this morning.   In the ED troponin was wnl, ECG showed rate-controlled AFib without ST deviations. BNP elevated to 167, though this is down from 529 on 5/7 and he didn't appear volume overloaded clinically. CXR shows chronic interstitial lung disease without acute changes.   Review of Systems: As mentioned in the history of present illness. All other systems reviewed and are negative.  Past Medical History:  Diagnosis Date   Arthritis    "neck, hands, fingers" (04/09/2014)   Bleeds easily (HCC)    CAD (coronary artery disease)    a. s/p prior stenting of LAD b.  patent stent by cath in 2016 and 06/2017 --> residual 60% D1 stenosis and moderate disease along the RCA    COPD (chronic obstructive pulmonary disease) (HCC)    GERD (gastroesophageal reflux disease)    History of kidney stones    Hyperlipemia    Hypertension    Lung disease caused by breathing particles (HCC)    Pneumonia    Sleep apnea    Past Surgical History:  Procedure Laterality Date   ANKLE FRACTURE SURGERY Right 2009   CARDIAC CATHETERIZATION  1990's X 2;  04/09/2014   CARDIOVERSION N/A 06/07/2018   Procedure: CARDIOVERSION;  Surgeon: Dolores Patty, MD;  Location: [facility] ENDOSCOPY;  Service: Cardiovascular;  Laterality: N/A;   CATARACT EXTRACTION W/PHACO Left 01/16/2022   Procedure: CATARACT EXTRACTION PHACO AND INTRAOCULAR LENS PLACEMENT (IOC);  Surgeon: Fabio Pierce, MD;  Location: [facility] ORS;  Service: Ophthalmology;  Laterality: Left;  CDE 14.63   CATARACT EXTRACTION W/PHACO Right 02/03/2022   Procedure: CATARACT EXTRACTION PHACO AND INTRAOCULAR LENS PLACEMENT (IOC);  Surgeon: Fabio Pierce, MD;  Location: [facility] ORS;  Service: Ophthalmology;  Laterality: Right;  CDE 11.94   COLONOSCOPY N/A 01/22/2017   Dr. Darrick Penna: 6 tubular adenomas removed, external hemorrhoids.  Recommended next exam in 3 years.   COLONOSCOPY WITH PROPOFOL N/A 03/02/2020   Surgeon: Earnest Bailey K, DO; Nonbleeding internal hemorrhoids, multiple diverticula in the sigmoid, descending, transverse colon, 2 polyps 4 to 6 mm in size and one 2 mm polyp.  Pathology with tubular adenoma and hyperplastic polyp. Recommended repeat in 5 years.   CORONARY ANGIOPLASTY WITH STENT PLACEMENT  2006   "2"  CORONARY PRESSURE/FFR STUDY N/A 06/08/2017   Procedure: INTRAVASCULAR PRESSURE WIRE/FFR STUDY;  Surgeon: Yvonne Kendall, MD;  Location: [facility] INVASIVE CV LAB;  Service: Cardiovascular;  Laterality: N/A;   CYSTOSCOPY W/ STONE MANIPULATION  ~ 2008   ESOPHAGOGASTRODUODENOSCOPY N/A 01/22/2017   Dr. Darrick Penna: Mild to moderate gastritis.  No H. pylori   ESOPHAGOGASTRODUODENOSCOPY N/A 05/31/2018   Procedure: ESOPHAGOGASTRODUODENOSCOPY (EGD);   Surgeon: West Bali, MD;  normal examined esophagus, gastritis due to ASA, and normal examined duodenum.  No obvious source for melena.    FRACTURE SURGERY     LEFT HEART CATH AND CORONARY ANGIOGRAPHY N/A 06/08/2017   Procedure: LEFT HEART CATH AND CORONARY ANGIOGRAPHY;  Surgeon: Yvonne Kendall, MD;  Location: [facility] INVASIVE CV LAB;  Service: Cardiovascular;  Laterality: N/A;   LEFT HEART CATH AND CORONARY ANGIOGRAPHY N/A 06/05/2018   Procedure: LEFT HEART CATH AND CORONARY ANGIOGRAPHY;  Surgeon: Lyn Records, MD;  Location: [facility] INVASIVE CV LAB;  Service: Cardiovascular;  Laterality: N/A;   LEFT HEART CATHETERIZATION WITH CORONARY ANGIOGRAM N/A 04/09/2014   Procedure: LEFT HEART CATHETERIZATION WITH CORONARY ANGIOGRAM;  Surgeon: Marykay Lex, MD;  Location: [facility] CATH LAB;  Service: Cardiovascular;  Laterality: N/A;   NASAL SEPTUM SURGERY  1999   POLYPECTOMY  01/22/2017   Procedure: POLYPECTOMY;  Surgeon: West Bali, MD;  Location: [facility] ENDO SUITE;  Service: Endoscopy;;  right colon x4   POLYPECTOMY  03/02/2020   Procedure: POLYPECTOMY;  Surgeon: Lanelle Bal, DO;  Location: [facility] ENDO SUITE;  Service: Endoscopy;;   TEE WITHOUT CARDIOVERSION N/A 06/07/2018   Procedure: TRANSESOPHAGEAL ECHOCARDIOGRAM (TEE);  Surgeon: Dolores Patty, MD;  Location: [facility] ENDOSCOPY;  Service: Cardiovascular;  Laterality: N/A;   Social History:  reports that he quit smoking about 58 years ago. His smoking use included cigarettes. He started smoking about 79 years ago. He has a 33.00 pack-year smoking history. He has never used smokeless tobacco. He reports that he does not drink alcohol and does not use drugs.  Allergies  Allergen Reactions   Vancomycin Rash    Family History  Problem Relation Age of Onset   Heart disease Mother    Colon cancer Neg Hx    Gastric cancer Neg Hx    Esophageal cancer Neg Hx     Prior to Admission medications   Medication Sig Start Date End Date Taking? Authorizing  Provider  albuterol (VENTOLIN HFA) 108 (90 Base) MCG/ACT inhaler Inhale 1-2 puffs into the lungs every 4 (four) hours as needed for wheezing or shortness of breath.   Yes [provider]  apixaban (ELIQUIS) 5 MG TABS tablet Take 1 tablet (5 mg total) by mouth every 12 (twelve) hours. 06/07/18  Yes Bhagat, Bhavinkumar, PA  atorvastatin (LIPITOR) 80 MG tablet Take 80 mg by mouth at bedtime.    Yes [provider]  Docusate Sodium (STOOL SOFTENER LAXATIVE PO) Take 2 tablets by mouth at bedtime.   Yes [provider]  doxazosin (CARDURA) 8 MG tablet Take 8 mg by mouth at bedtime.    Yes [provider]  fluticasone (FLONASE) 50 MCG/ACT nasal spray Place 2 sprays into the nose daily as needed (allergies).   Yes [provider]  furosemide (LASIX) 20 MG tablet Take 2 tablets (40 mg total) by mouth daily for 7 days. 06/13/22 06/20/22 Yes Gerhard Munch, MD  metoprolol succinate (TOPROL-XL) 25 MG 24 hr tablet Take 25 mg by mouth daily.    Yes [provider]  nitroGLYCERIN (NITROSTAT)  0.4 MG SL tablet PLACE 1 TABLET (0.4 MG TOTAL) UNDER THE TONGUE EVERY 5 (FIVE) MINUTES AS NEEDED FOR CHEST PAIN. 06/29/17  Yes Branch, Dorothe Pea, MD  pantoprazole (PROTONIX) 40 MG tablet TAKE 1 TABLET BY MOUTH DAILY Patient taking differently: Take 40 mg by mouth daily. 11/10/15  Yes Branch, Dorothe Pea, MD  potassium chloride (KLOR-CON) 10 MEQ tablet Take 1 tablet (10 mEq total) by mouth daily. 07/25/21  Yes Dyann Kief, PA-C  ranolazine (RANEXA) 500 MG 12 hr tablet Take 500 mg by mouth 2 (two) times daily.  12/31/17  Yes [provider]  losartan (COZAAR) 25 MG tablet Take 25 mg by mouth daily. 05/16/22   [provider]  Nintedanib (OFEV) 150 MG CAPS Take 1 capsule (150 mg total) by mouth 2 (two) times daily. Patient not taking: Reported on 01/20/2022 08/29/21   Oretha Milch, MD    Physical Exam: Vitals:   06/15/22 1230 06/15/22 1300 06/15/22 1400  06/15/22 1453  BP: 121/84 138/89 (!) 133/91 (!) 151/92  Pulse: 86 98 75 87  Resp: 15 17 19    Temp:    98.1 F (36.7 C)  TempSrc:    Oral  SpO2: 100% 100% 98% 99%  Weight:      Height:      Gen: No distress, elderly Pulm: Crackles diffusely, no wheezes, nonlabored at rest  CV: Irreg irreg, no MRG or pitting edema or JVD GI: Soft, NT, ND, +BS Neuro: Alert and oriented. No new focal deficits. Ext: Warm, no deformities. Skin: No rashes, lesions or ulcers on visualized skin  Data Reviewed: Troponin 16 ECG AFib without ST elevations (personal interpretation) CXR: Interstitial prominence without pulmonary edema, effusion or infiltrate (personal interpretation)  Na 129, bicarb 21, BUN 24, SCr 1.24 (previously 1.0. LFTs wnl. WBC 9.1k. Hgb 12.6g/dl. Plt 275k.   Assessment and Plan: CAD with exertional chest tightness concerning for unstable angina:  - Given aspirin - Continue prn morphine, NTG - No heparin gtt per cardiology at this time as long as delta troponin not rising (remains normal on recheck at 1:30pm).  - Echocardiogram ordered and pending. Holding eliquis with possibility of cardiac angiography 5/10. Will make NPO p MN.  - Continue atorvastatin, LDL 80 July 2023, metoprolol, ranexa.  HTN:  - Continue metoprolol succinate 50mg . Will see renal parameters in AM to decide on continuing ARB.   IPF: This explains a significant amount of exertional dyspnea that is chronic and progressive.  - Continue OFEV per [facility], Dr. Vassie Loll. Pt may not be taking this medication currently per pharmacy med rec. Can have prn albuterol though per pt, there was no significant COPD on testing. He has no wheezing currently.   Persistent AFib: Rate controlled, had maintained NSR as of August 2023 after DCCV 2020.  - Continue metoprolol, hold eliquis.   AKI: Mild. SCr 1.28. May have been slightly over diuresed.  - Hold lasix, avoid nephrotoxins today and recheck in AM.   DNR: POA. Discussed  with patient and spouse at length at admission with chaplain present. No restriction on care prior to the event of a cardiac/respiratory arrest however.   Hyponatremia: Monitor off lasix. Asymptomatic.   BPH with LUTS: On ROS.  - Continue doxazosin.  - Urology follow up   Advance Care Planning: DNR  Consults: Cardiology, Dr. Diona Browner  Family Communication: Spouse at bedside  Severity of Illness: The appropriate patient status for this patient is OBSERVATION. Observation status is judged to be reasonable and  necessary in order to provide the required intensity of service to ensure the patient's safety. The patient's presenting symptoms, physical exam findings, and initial radiographic and laboratory data in the context of their medical condition is felt to place them at decreased risk for further clinical deterioration. Furthermore, it is anticipated that the patient will be medically stable for discharge from the hospital within 2 midnights of admission.   Author: Tyrone Nine, MD 06/15/2022 3:22 PM  For on call review [email or web address].

## 2022-06-15 NOTE — ED Triage Notes (Signed)
Pt c/o all over chest pain with SOB since 7am . Pt took a nitro prior to ED did not help just gave him a headache.

## 2022-06-15 NOTE — ED Provider Notes (Signed)
[place] [facility]MENT AT [facility] Provider Note   CSN: [id number] Arrival date & time: 06/15/22  5784     History  Chief Complaint  Patient presents with   Chest Pain   Shortness of Breath    Eric Benitez is a 82 y.o. male.  HPI   82 year old male presents emergency department with complaints of shortness of breath, chest pain, neck pain.  Patient most recently seen 2 days ago and diagnosed with mild CHF exacerbation and told to increase Lasix at home of which she states helped his symptoms significantly.  States that he woke up around 6-7 this morning and began to develop chest tightness as well as shortness of breath when he was walking around; states that at that time became diaphoretic.  States that shortness of breath and chest pain have been constant since onset.  States he took a nitro prior to come to the emergency department with no relief of symptoms but did give him mild headache.  Reports cough that is been consistent over the past 3 days.  Also reports posterior neck pain.  States he has a history of "arthritis" in the neck but pain worsened with symptom onset earlier this morning; patient states that he had similar sort of neck pain with prior myocardial infarction.  Denies fever, abdominal pain, nausea, vomiting, urinary symptoms, change in bowel habits.  Past medical history significant for CAD, COPD, GERD, hypertension, hyperlipidemia, paroxysmal atrial fibrillation on Eliquis, COPD, idiopathic pulmonary fibrosis, chronic diastolic heart failure with left ventricular ejection fraction 65 to 70%  Home Medications Prior to Admission medications   Medication Sig Start Date End Date Taking? Authorizing Provider  albuterol (VENTOLIN HFA) 108 (90 Base) MCG/ACT inhaler Inhale 1-2 puffs into the lungs every 4 (four) hours as needed for wheezing or shortness of breath.   Yes [provider]  apixaban (ELIQUIS) 5 MG TABS tablet Take 1 tablet (5 mg  total) by mouth every 12 (twelve) hours. 06/07/18  Yes Bhagat, Bhavinkumar, PA  atorvastatin (LIPITOR) 80 MG tablet Take 80 mg by mouth at bedtime.    Yes [provider]  Docusate Sodium (STOOL SOFTENER LAXATIVE PO) Take 2 tablets by mouth at bedtime.   Yes [provider]  doxazosin (CARDURA) 8 MG tablet Take 8 mg by mouth at bedtime.    Yes [provider]  fluticasone (FLONASE) 50 MCG/ACT nasal spray Place 2 sprays into the nose daily as needed (allergies).   Yes [provider]  furosemide (LASIX) 20 MG tablet Take 2 tablets (40 mg total) by mouth daily for 7 days. 06/13/22 06/20/22 Yes Gerhard Munch, MD  metoprolol succinate (TOPROL-XL) 25 MG 24 hr tablet Take 25 mg by mouth daily.    Yes [provider]  nitroGLYCERIN (NITROSTAT) 0.4 MG SL tablet PLACE 1 TABLET (0.4 MG TOTAL) UNDER THE TONGUE EVERY 5 (FIVE) MINUTES AS NEEDED FOR CHEST PAIN. 06/29/17  Yes Branch, Dorothe Pea, MD  pantoprazole (PROTONIX) 40 MG tablet TAKE 1 TABLET BY MOUTH DAILY Patient taking differently: Take 40 mg by mouth daily. 11/10/15  Yes Branch, Dorothe Pea, MD  potassium chloride (KLOR-CON) 10 MEQ tablet Take 1 tablet (10 mEq total) by mouth daily. 07/25/21  Yes Dyann Kief, PA-C  ranolazine (RANEXA) 500 MG 12 hr tablet Take 500 mg by mouth 2 (two) times daily.  12/31/17  Yes [provider]  losartan (COZAAR) 25 MG tablet Take 25 mg by mouth daily. 05/16/22   [provider]  Nintedanib (OFEV) 150 MG CAPS Take 1 capsule (150 mg total) by mouth 2 (two) times daily. Patient not taking: Reported on 01/20/2022 08/29/21   Oretha Milch, MD      Allergies    Vancomycin    Review of Systems   Review of Systems  All other systems reviewed and are negative.   Physical Exam Updated Vital Signs BP (!) 151/92 (BP Location: Left Arm)   Pulse 87   Temp 98.1 F (36.7 C) (Oral)   Resp 19   Ht 5\' 9"  (1.753 m)   Wt 74.8 kg   SpO2 99%   BMI 24.37 kg/m   Physical Exam Vitals and nursing note reviewed.  Constitutional:      General: He is not in acute distress.    Appearance: He is well-developed.  HENT:     Head: Normocephalic and atraumatic.  Eyes:     Conjunctiva/sclera: Conjunctivae normal.  Cardiovascular:     Rate and Rhythm: Normal rate and regular rhythm.     Pulses: Normal pulses.  Pulmonary:     Effort: Pulmonary effort is normal. No respiratory distress.     Breath sounds: Wheezing and rhonchi present.     Comments: Diffuse wheeze/rhonchi.  No reproducible chest wall tenderness. Chest:     Chest wall: No tenderness.  Abdominal:     Palpations: Abdomen is soft.     Tenderness: There is no abdominal tenderness. There is no right CVA tenderness, left CVA tenderness or guarding.  Musculoskeletal:        General: No swelling.     Cervical back: Neck supple.     Right lower leg: No edema.     Left lower leg: No edema.     Comments: No midline tenderness in cervical spine.  Paraspinal tenderness noted bilaterally in cervical region.  Skin:    General: Skin is warm and dry.     Capillary Refill: Capillary refill takes less than 2 seconds.  Neurological:     Mental Status: He is alert.  Psychiatric:        Mood and Affect: Mood normal.     ED Results / Procedures / Treatments   Labs (all labs ordered are listed, but only abnormal results are displayed) Labs Reviewed  COMPREHENSIVE METABOLIC PANEL - Abnormal; Notable for the following components:      Result Value   Sodium 129 (*)    Chloride 97 (*)    CO2 21 (*)    Glucose, Bld 121 (*)    BUN 24 (*)    Creatinine, Ser 1.28 (*)    GFR, Estimated 56 (*)    All other components within normal limits  BRAIN NATRIURETIC PEPTIDE - Abnormal; Notable for the following components:   B Natriuretic Peptide 167.0 (*)    All other components within normal limits  CBC WITH DIFFERENTIAL/PLATELET - Abnormal; Notable for the following components:   RBC 3.91 (*)    Hemoglobin  12.6 (*)    HCT 36.8 (*)    Abs Immature Granulocytes 0.10 (*)    All other components within normal limits  SARS CORONAVIRUS 2 BY RT PCR  TROPONIN I (HIGH SENSITIVITY)  TROPONIN I (HIGH SENSITIVITY)    EKG EKG Interpretation  Date/Time:  Thursday Jun 15 2022 10:16:23 EDT Ventricular Rate:  92 PR Interval:    QRS Duration: 74 QT Interval:  355 QTC Calculation: 440 R Axis:   3 Text Interpretation: Atrial fibrillation no ST elevation Confirmed by  Eber Hong [phone]) on 06/15/2022 11:39:07 AM  Radiology DG Chest 2 View  Result Date: 06/15/2022 CLINICAL DATA:  Chest pain and shortness of breath since this morning. EXAM: CHEST - 2 VIEW COMPARISON:  Chest radiograph 2 days prior. FINDINGS: The heart is enlarged, unchanged. The upper mediastinal contours are normal. Coarsened interstitial markings are again seen throughout both lungs consistent with underlying chronic lung disease. There is no new or worsening focal airspace opacity. There is no pulmonary edema. There is no pleural effusion or pneumothorax There is no acute osseous abnormality. IMPRESSION: Stable chronic lung disease without acute airspace opacity. Electronically Signed   By: Lesia Hausen M.D.   On: 06/15/2022 10:57    Procedures Procedures    Medications Ordered in ED Medications  morphine (PF) 4 MG/ML injection 4 mg (has no administration in time range)  nitroGLYCERIN (NITROGLYN) 2 % ointment 0.5 inch (has no administration in time range)  doxazosin (CARDURA) tablet 8 mg (has no administration in time range)  atorvastatin (LIPITOR) tablet 80 mg (has no administration in time range)  metoprolol succinate (TOPROL-XL) 24 hr tablet 25 mg (has no administration in time range)  ranolazine (RANEXA) 12 hr tablet 500 mg (has no administration in time range)  pantoprazole (PROTONIX) EC tablet 40 mg (has no administration in time range)  senna-docusate (Senokot-S) tablet 1 tablet (has no administration in time range)   fluticasone (FLONASE) 50 MCG/ACT nasal spray 2 spray (has no administration in time range)  acetaminophen (TYLENOL) tablet 650 mg (has no administration in time range)  ondansetron (ZOFRAN) injection 4 mg (has no administration in time range)  enoxaparin (LOVENOX) injection 40 mg (has no administration in time range)  aspirin chewable tablet 81 mg (has no administration in time range)  albuterol (PROVENTIL) (2.5 MG/3ML) 0.083% nebulizer solution 2.5 mg (has no administration in time range)  ipratropium-albuterol (DUONEB) 0.5-2.5 (3) MG/3ML nebulizer solution 3 mL (3 mLs Nebulization Given 06/15/22 1037)  morphine (PF) 4 MG/ML injection 4 mg (4 mg Intravenous Given 06/15/22 1138)  nitroGLYCERIN (NITROGLYN) 2 % ointment 0.5 inch (0.5 inches Topical Given 06/15/22 1035)  aspirin chewable tablet 324 mg (324 mg Oral Given 06/15/22 1138)    ED Course/ Medical Decision Making/ A&P Clinical Course as of 06/15/22 1903  Thu Jun 15, 2022  1103 DG Chest 2 View [CR]  1201 Consulted hospitalist Dr. Jarvis Newcomer he recommended reaching out to cardiology for opinion whether or not to admit and location of admission if admission is pursued. [CR]  1247 Consulted Dr. Diona Browner of cardiology who recommended admission to any pain with further workup with echocardiogram and further diuresis/medicine management. [CR]    Clinical Course User Index [CR] Peter Garter, PA                             Medical Decision Making Amount and/or Complexity of Data Reviewed Labs: ordered. Radiology: ordered. Decision-making details documented in ED Course. ECG/medicine tests: ordered.  Risk OTC drugs. Prescription drug management. Decision regarding hospitalization.   This patient presents to the ED for concern of chest pain/shortness of breath, this involves an extensive number of treatment options, and is a complaint that carries with it a high risk of complications and morbidity.  The differential diagnosis includes The  causes for shortness of breath include but are not limited to Cardiac (AHF, pericardial effusion and tamponade, arrhythmias, ischemia, etc) Respiratory (COPD, asthma, pneumonia, pneumothorax, primary pulmonary hypertension, PE/VQ mismatch) Hematological (anemia)  Co morbidities that complicate the patient evaluation  See HPI   Additional history obtained:  Additional history obtained from EMR External records from outside source obtained and reviewed including hospital records   Lab Tests:  I Ordered, and personally interpreted labs.  The pertinent results include: No leukocytosis.  Patient with mild evidence of anemia with hemoglobin 12.6 of which is near patient's baseline from prior laboratory studies performed.  Hyponatremia, hyperchloremia, decrease in bicarb of 129, 97 and 21 respectively.  Very mild AKI with creatinine 1.28, BUN 24 and GFR 56.  No transaminitis.  BNP slightly elevated 167.  Initial troponin is 16.   Imaging Studies ordered:  I ordered imaging studies including chest x-ray I independently visualized and interpreted imaging which showed no acute cardiopulmonary abnormalities.  Stable lung disease I agree with the radiologist interpretation   Cardiac Monitoring: / EKG:  The patient was maintained on a cardiac monitor.  I personally viewed and interpreted the cardiac monitored which showed an underlying rhythm of: Atrial fibrillation without obvious acute ischemic change   Consultations Obtained:  See ED course  Problem List / ED Course / Critical interventions / Medication management  Chest pain, shortness of breath I ordered medication including nitroglycerin, DuoNeb, morphine, aspirin   Reevaluation of the patient after these medicines showed that the patient improved I have reviewed the patients home medicines and have made adjustments as needed   Social Determinants of Health:  Former cigarette use.  Denies illicit drug use.   Test /  Admission - Considered:  Chest pain, shortness of breath Vitals signs within normal range and stable throughout visit. Laboratory/imaging studies significant for: See above 82 year old male presents emergency department with complaints of chest pain and shortness of breath began with exertion earlier this morning.  Patient was recently seen 2 days ago with evidence of CHF exacerbation at that time with noted improvement without home Lasix.  Patient without evidence of recurrence of CHF exacerbation clinically.  Patient reporting pain in similar distribution from prior myocardial infarction.  Some concern for unstable angina with negative initial troponin and EKG without obvious acute ischemic changes.  Shared decision made conversation was had with the patient regarding admission to the hospital given concern for unstable angina of which patient was in agreement.  Consulted cardiology who was in agreement with clinical picture for unstable angina.  Will admit through hospital medicine. Treatment plan were discussed at length with patient and they knowledge understanding was agreeable to said plan.  Appropriate consultations were made as described in the ED course.  Patient was stable upon admission to the hospital.         Final Clinical Impression(s) / ED Diagnoses Final diagnoses:  Chest pain, unspecified type    Rx / DC Orders ED Discharge Orders     None         Peter Garter, [place] 06/15/22 Julian Reil    Eber Hong, MD 06/19/22 1007

## 2022-06-15 NOTE — Consult Note (Signed)
Cardiology Consultation:   Patient ID: Eric Benitez; [id number]; March 20, 1940   Admit date: 06/15/2022 Date of Consult: 06/15/2022  Primary Care Provider: Richardean Chimera, MD Primary Cardiologist: Dina Rich, MD  History of Present Illness:   Eric Benitez is an 82 y.o. male presenting to the [facility] ER complaining of recurrent exertional chest pressure and neck pain as well as dyspnea on exertion and fatigue.  Symptoms began last Wednesday over baseline mild dyspnea on exertion.  He thought that he may have been developing a cold and saw his PCP last Friday, was treated with antibiotics as an outpatient, ultimately came to the ER at [facility] on May 7 given persistent symptoms.  It was felt that he had some component of fluid overload as contributor, was started on Lasix as an outpatient and already had a visit to see Dr. Wyline Mood next week as an outpatient.  High-sensitivity troponin I levels from his ER visit on May 7 were 10 and 11, BNP was 529 at that time.  Chest x-ray indicated chronic changes consistent with COPD and interstitial lung disease.  ECG showed rate controlled atrial fibrillation with no acute ST segment changes.  At presentation today follow-up chest x-ray shows no acute findings and his initial high-sensitivity troponin I is only 16.  BNP 167.  ECG remains nonacute.  He tells me that his current symptoms, neck pain with activity in particular, are reminiscent of symptoms prior to his last stent intervention.  States that he has been compliant with his medications as an outpatient including Eliquis.  Last dose was this morning.  ROS:  Pertinent review in history of present illness.  No sense of palpitations, no orthopnea or PND.  No frank syncope.  Past Medical History:  Diagnosis Date   Arthritis    "neck, hands, fingers" (04/09/2014)   Bleeds easily (HCC)    CAD (coronary artery disease)    a. s/p prior stenting of LAD b.  patent stent by cath in 2016 and 06/2017 -->  residual 60% D1 stenosis and moderate disease along the RCA   COPD (chronic obstructive pulmonary disease) (HCC)    GERD (gastroesophageal reflux disease)    History of kidney stones    Hyperlipemia    Hypertension    Lung disease caused by breathing particles (HCC)    Pneumonia    Sleep apnea     Past Surgical History:  Procedure Laterality Date   ANKLE FRACTURE SURGERY Right 2009   CARDIAC CATHETERIZATION  1990's X 2;  04/09/2014   CARDIOVERSION N/A 06/07/2018   Procedure: CARDIOVERSION;  Surgeon: Dolores Patty, MD;  Location: [facility] ENDOSCOPY;  Service: Cardiovascular;  Laterality: N/A;   CATARACT EXTRACTION W/PHACO Left 01/16/2022   Procedure: CATARACT EXTRACTION PHACO AND INTRAOCULAR LENS PLACEMENT (IOC);  Surgeon: Fabio Pierce, MD;  Location: [facility] ORS;  Service: Ophthalmology;  Laterality: Left;  CDE 14.63   CATARACT EXTRACTION W/PHACO Right 02/03/2022   Procedure: CATARACT EXTRACTION PHACO AND INTRAOCULAR LENS PLACEMENT (IOC);  Surgeon: Fabio Pierce, MD;  Location: [facility] ORS;  Service: Ophthalmology;  Laterality: Right;  CDE 11.94   COLONOSCOPY N/A 01/22/2017   Dr. Darrick Penna: 6 tubular adenomas removed, external hemorrhoids.  Recommended next exam in 3 years.   COLONOSCOPY WITH PROPOFOL N/A 03/02/2020   Surgeon: Earnest Bailey K, DO; Nonbleeding internal hemorrhoids, multiple diverticula in the sigmoid, descending, transverse colon, 2 polyps 4 to 6 mm in size and one 2 mm polyp.  Pathology with tubular adenoma and hyperplastic polyp.  Recommended repeat in 5 years.   CORONARY ANGIOPLASTY WITH STENT PLACEMENT  2006   "2"   CORONARY PRESSURE/FFR STUDY N/A 06/08/2017   Procedure: INTRAVASCULAR PRESSURE WIRE/FFR STUDY;  Surgeon: Yvonne Kendall, MD;  Location: [facility] INVASIVE CV LAB;  Service: Cardiovascular;  Laterality: N/A;   CYSTOSCOPY W/ STONE MANIPULATION  ~ 2008   ESOPHAGOGASTRODUODENOSCOPY N/A 01/22/2017   Dr. Darrick Penna: Mild to moderate gastritis.  No H. pylori    ESOPHAGOGASTRODUODENOSCOPY N/A 05/31/2018   Procedure: ESOPHAGOGASTRODUODENOSCOPY (EGD);  Surgeon: West Bali, MD;  normal examined esophagus, gastritis due to ASA, and normal examined duodenum.  No obvious source for melena.    FRACTURE SURGERY     LEFT HEART CATH AND CORONARY ANGIOGRAPHY N/A 06/08/2017   Procedure: LEFT HEART CATH AND CORONARY ANGIOGRAPHY;  Surgeon: Yvonne Kendall, MD;  Location: [facility] INVASIVE CV LAB;  Service: Cardiovascular;  Laterality: N/A;   LEFT HEART CATH AND CORONARY ANGIOGRAPHY N/A 06/05/2018   Procedure: LEFT HEART CATH AND CORONARY ANGIOGRAPHY;  Surgeon: Lyn Records, MD;  Location: [facility] INVASIVE CV LAB;  Service: Cardiovascular;  Laterality: N/A;   LEFT HEART CATHETERIZATION WITH CORONARY ANGIOGRAM N/A 04/09/2014   Procedure: LEFT HEART CATHETERIZATION WITH CORONARY ANGIOGRAM;  Surgeon: Marykay Lex, MD;  Location: [facility] CATH LAB;  Service: Cardiovascular;  Laterality: N/A;   NASAL SEPTUM SURGERY  1999   POLYPECTOMY  01/22/2017   Procedure: POLYPECTOMY;  Surgeon: West Bali, MD;  Location: [facility] ENDO SUITE;  Service: Endoscopy;;  right colon x4   POLYPECTOMY  03/02/2020   Procedure: POLYPECTOMY;  Surgeon: Lanelle Bal, DO;  Location: [facility] ENDO SUITE;  Service: Endoscopy;;   TEE WITHOUT CARDIOVERSION N/A 06/07/2018   Procedure: TRANSESOPHAGEAL ECHOCARDIOGRAM (TEE);  Surgeon: Dolores Patty, MD;  Location: [facility] ENDOSCOPY;  Service: Cardiovascular;  Laterality: N/A;     Outpatient Medications: No current facility-administered medications on file prior to encounter.   Current Outpatient Medications on File Prior to Encounter  Medication Sig Dispense Refill   albuterol (VENTOLIN HFA) 108 (90 Base) MCG/ACT inhaler Inhale 1-2 puffs into the lungs every 4 (four) hours as needed for wheezing or shortness of breath.     apixaban (ELIQUIS) 5 MG TABS tablet Take 1 tablet (5 mg total) by mouth every 12 (twelve) hours. 60 tablet 11   atorvastatin (LIPITOR) 80 MG tablet  Take 80 mg by mouth at bedtime.      Docusate Sodium (STOOL SOFTENER LAXATIVE PO) Take 2 tablets by mouth at bedtime.     doxazosin (CARDURA) 8 MG tablet Take 8 mg by mouth at bedtime.      fluticasone (FLONASE) 50 MCG/ACT nasal spray Place 2 sprays into the nose daily as needed (allergies).     furosemide (LASIX) 20 MG tablet Take 2 tablets (40 mg total) by mouth daily for 7 days. 30 tablet 0   metoprolol succinate (TOPROL-XL) 25 MG 24 hr tablet Take 25 mg by mouth daily.      nitroGLYCERIN (NITROSTAT) 0.4 MG SL tablet PLACE 1 TABLET (0.4 MG TOTAL) UNDER THE TONGUE EVERY 5 (FIVE) MINUTES AS NEEDED FOR CHEST PAIN. 25 tablet 3   pantoprazole (PROTONIX) 40 MG tablet TAKE 1 TABLET BY MOUTH DAILY (Patient taking differently: Take 40 mg by mouth daily.) 30 tablet 6   potassium chloride (KLOR-CON) 10 MEQ tablet Take 1 tablet (10 mEq total) by mouth daily. 90 tablet 3   ranolazine (RANEXA) 500 MG 12 hr tablet Take 500 mg by mouth 2 (two) times daily.  losartan (COZAAR) 25 MG tablet Take 25 mg by mouth daily.     Nintedanib (OFEV) 150 MG CAPS Take 1 capsule (150 mg total) by mouth 2 (two) times daily. (Patient not taking: Reported on 01/20/2022) 60 capsule 5     Allergies:    Allergies  Allergen Reactions   Vancomycin Rash    Social History:   Social History   Tobacco Use   Smoking status: Former    Packs/day: 1.50    Years: 22.00    Additional pack years: 0.00    Total pack years: 33.00    Types: Cigarettes    Start date: 11/28/1942    Quit date: 02/07/1964    Years since quitting: 58.3   Smokeless tobacco: Never  Substance Use Topics   Alcohol use: No    Alcohol/week: 0.0 standard drinks of alcohol    Family History:   The patient's family history includes Heart disease in his mother. There is no history of Colon cancer, Gastric cancer, or Esophageal cancer.  Physical Exam/Data:   Vitals:   06/15/22 1100 06/15/22 1200 06/15/22 1230 06/15/22 1300  BP: (!) 135/94 (!) 131/90  121/84 138/89  Pulse: 83 86 86 98  Resp: 19 17 15 17   Temp:      TempSrc:      SpO2: 100% 100% 100% 100%  Weight:      Height:       No intake or output data in the 24 hours ending 06/15/22 1321 Filed Weights   06/15/22 1019  Weight: 74.8 kg   Body mass index is 24.37 kg/m.   Gen: Patient appears comfortable at rest. HEENT: Conjunctiva and lids normal. Neck: Supple, no elevated JVP or carotid bruits. Lungs: Coarse, decreased breath sounds without wheezing or egophony. Cardiac: Irregularly irregular, no S3, 1/6 systolic murmur. Abdomen: Soft, nontender, bowel sounds present. Extremities: No pitting edema, distal pulses 2+. Skin: Warm and dry. Musculoskeletal: No kyphosis. Neuropsychiatric: Alert and oriented x3, affect grossly appropriate.  EKG:  An ECG dated 06/15/2022 was personally reviewed today and demonstrated:  Rate controlled atrial fibrillation with no acute ST segment changes.  Telemetry:  I personally reviewed telemetry which shows atrial fibrillation.  Relevant CV Studies:  Lexiscan Myoview 10/11/2021:   Findings are consistent with no prior ischemia. The study is low risk.   No ST deviation was noted.   There is no evidence of ischemia.   Left ventricular function is normal. End diastolic cavity size is normal. End systolic cavity size is normal.   Prior study available for comparison.   IMPRESSIONS Negative for stress induced arrhythmias.  Stress ECG nondiagnostic due to pharmacologic protocol. Normal left ventricular function. There is a perfusion defect in the apex in rest and at stress with normal wall motion consistent with artifact. There is diaphragmatic attenuation in the stress study not present in the rest study.   CONCLUSIONS No ischemia.  Negatives stress test. Low risk study. Similar to 2021 study.  Laboratory Data:  Chemistry Recent Labs  Lab 06/13/22 1019 06/15/22 1003  NA 133* 129*  K 4.3 4.2  CL 101 97*  CO2 21* 21*  GLUCOSE  106* 121*  BUN 18 24*  CREATININE 1.07 1.28*  CALCIUM 9.0 8.9  GFRNONAA >60 56*  ANIONGAP 11 11    Recent Labs  Lab 06/13/22 1019 06/15/22 1003  PROT 8.1 7.5  ALBUMIN 3.7 3.5  AST 26 24  ALT 23 23  ALKPHOS 70 66  BILITOT 0.8 0.5   Hematology  Recent Labs  Lab 06/13/22 1019 06/15/22 1003  WBC 7.5 9.1  RBC 4.02* 3.91*  HGB 13.0 12.6*  HCT 37.9* 36.8*  MCV 94.3 94.1  MCH 32.3 32.2  MCHC 34.3 34.2  RDW 13.3 13.2  PLT 238 275   Cardiac Enzymes Recent Labs  Lab 06/13/22 1030 06/13/22 1231 06/15/22 1003  TROPONINIHS 10 11 16    BNP Recent Labs  Lab 06/13/22 1030 06/15/22 1026  BNP 529.0* 167.0*     Lipid Panel     Component Value Date/Time   CHOL 123 06/05/2018 0539   TRIG 37 06/05/2018 0539   HDL 50 06/05/2018 0539   CHOLHDL 2.5 06/05/2018 0539   VLDL 7 06/05/2018 0539   LDLCALC 66 06/05/2018 0539    Radiology/Studies:  DG Chest 2 View  Result Date: 06/15/2022 CLINICAL DATA:  Chest pain and shortness of breath since this morning. EXAM: CHEST - 2 VIEW COMPARISON:  Chest radiograph 2 days prior. FINDINGS: The heart is enlarged, unchanged. The upper mediastinal contours are normal. Coarsened interstitial markings are again seen throughout both lungs consistent with underlying chronic lung disease. There is no new or worsening focal airspace opacity. There is no pulmonary edema. There is no pleural effusion or pneumothorax There is no acute osseous abnormality. IMPRESSION: Stable chronic lung disease without acute airspace opacity. Electronically Signed   By: Lesia Hausen M.D.   On: 06/15/2022 10:57   DG Chest 2 View  Result Date: 06/13/2022 CLINICAL DATA:  Shortness of breath, upper chest tightness, history lung disease, CHF, COPD, former smoker EXAM: CHEST - 2 VIEW COMPARISON:  10/04/2021 FINDINGS: Upper normal heart size. Mediastinal contours and pulmonary vascularity normal. COPD changes with chronic interstitial lung disease changes involving the mid to lower  lungs bilaterally similar to previous exam. No definite superimposed infiltrate, pleural effusion, or pneumothorax. Multiple old RIGHT rib fractures. IMPRESSION: Changes of COPD and chronic interstitial lung disease. No acute abnormalities. Electronically Signed   By: Ulyses Southward M.D.   On: 06/13/2022 10:49    Assessment and Plan:   1.  Symptoms concerning for unstable angina as discussed above, onset last Wednesday.  Initial high-sensitivity troponin I levels are reassuring and his ECG shows no acute ST segment changes with good heart rate control in atrial fibrillation.  States that the exertional chest pressure and in particular neck pain is very reminiscent of prior angina.  Currently pain-free in the ER with plan to admit to the hospitalist service for further workup.  There was some concern for fluid overload based on his last ER encounter and some mild improvement with diuretics.  Echocardiogram needs to be updated in comparison to study from last year.  2.  CAD status post remote LAD stent intervention, patent at angiography as of 2019 with residual disease including moderate first diagonal stenosis and moderate RCA stenosis managed medically.  Lexiscan Myoview in September 2023 was low risk without active ischemia.  Echocardiogram from March 2023 revealed LVEF 65 to 70%.  Not on aspirin as an outpatient given use of Eliquis.  He is on Toprol-XL, Ranexa, and Lipitor.  3.  COPD and possible interstitial lung disease.  How much of this is a contributor to his more chronic dyspnea exertion is unclear.  4.  Mixed hyperlipidemia, on Lipitor 80 mg daily.  LDL 80 in July of last year.  5.  Persistent atrial fibrillation and history of typical atrial flutter with CHA2DS2-VASc score of 5.  He has prior history of successful cardioversion in 2020,  was in sinus rhythm as of August 2023, however back in atrial fibrillation over the last several months at least.  Heart rate is controlled and he reports no  sense of palpitations.  He is on Toprol-XL and Eliquis.  6.  Acute renal insufficiency with creatinine 1.28 in the setting of recent outpatient use of Lasix.  Potassium normal at 4.2.  Chart reviewed and situation discussed with the patient and his wife.  Recommend admission for further evaluation.  He is clinically stable at this time and will be admitted to the hospitalist service here at [facility].  Last Eliquis dose was this morning, would hold further use in anticipation of a diagnostic cardiac catheterization later tomorrow afternoon following transfer to [facility].  Risks and benefits discussed and he is in agreement to proceed.  Not clear that he needs heparin in the meanwhile unless his high-sensitivity troponin I levels escalate further.  Update echocardiogram to ensure no change in LVEF.  Hold off on further diuretic use for now.  Check CBC and BMET in a.m.  Repeat ECG in a.m.  Otherwise continue baseline medical therapy.  For questions or updates, please contact  [facility] Please consult [email or web address] for contact info under   Signed, Nona Dell, MD  06/15/2022 1:21 PM

## 2022-06-15 NOTE — ED Notes (Signed)
Dr. McDowell at bedside.  

## 2022-06-16 ENCOUNTER — Observation Stay (HOSPITAL_BASED_OUTPATIENT_CLINIC_OR_DEPARTMENT_OTHER): Payer: PPO

## 2022-06-16 ENCOUNTER — Encounter (HOSPITAL_COMMUNITY)
Admission: EM | Disposition: A | Discharge status: Home / Self Care | Payer: Self-pay | Source: Home / Self Care | Attending: Emergency Medicine

## 2022-06-16 DIAGNOSIS — R079 Chest pain, unspecified: Secondary | ICD-10-CM | POA: Diagnosis not present

## 2022-06-16 DIAGNOSIS — I25119 Atherosclerotic heart disease of native coronary artery with unspecified angina pectoris: Secondary | ICD-10-CM

## 2022-06-16 DIAGNOSIS — I200 Unstable angina: Secondary | ICD-10-CM | POA: Diagnosis not present

## 2022-06-16 DIAGNOSIS — J449 Chronic obstructive pulmonary disease, unspecified: Secondary | ICD-10-CM | POA: Diagnosis not present

## 2022-06-16 DIAGNOSIS — N179 Acute kidney failure, unspecified: Secondary | ICD-10-CM | POA: Diagnosis not present

## 2022-06-16 DIAGNOSIS — E782 Mixed hyperlipidemia: Secondary | ICD-10-CM | POA: Diagnosis not present

## 2022-06-16 HISTORY — PX: LEFT HEART CATH AND CORONARY ANGIOGRAPHY: CATH118249

## 2022-06-16 LAB — ECHOCARDIOGRAM COMPLETE
Area-P 1/2: 2.91 cm2
Height: 69 in
S' Lateral: 2.2 cm
Weight: 2550.28 oz

## 2022-06-16 LAB — BASIC METABOLIC PANEL
Anion gap: 9 (ref 5–15)
BUN: 26 mg/dL — ABNORMAL HIGH (ref 8–23)
CO2: 24 mmol/L (ref 22–32)
Calcium: 8.7 mg/dL — ABNORMAL LOW (ref 8.9–10.3)
Chloride: 100 mmol/L (ref 98–111)
Creatinine, Ser: 1.29 mg/dL — ABNORMAL HIGH (ref 0.61–1.24)
GFR, Estimated: 56 mL/min — ABNORMAL LOW (ref >60)
Glucose, Bld: 108 mg/dL — ABNORMAL HIGH (ref 70–99)
Potassium: 4.4 mmol/L (ref 3.5–5.1)
Sodium: 133 mmol/L — ABNORMAL LOW (ref 135–145)

## 2022-06-16 LAB — GLUCOSE, CAPILLARY: Glucose-Capillary: 120 mg/dL — ABNORMAL HIGH (ref 70–99)

## 2022-06-16 SURGERY — LEFT HEART CATH AND CORONARY ANGIOGRAPHY
Anesthesia: LOCAL

## 2022-06-16 MED ORDER — HEPARIN SODIUM (PORCINE) 1000 UNIT/ML IJ SOLN
INTRAMUSCULAR | Status: DC | PRN
  Administered 2022-06-16: 3500 [IU] via INTRAVENOUS

## 2022-06-16 MED ORDER — MIDAZOLAM HCL 2 MG/2ML IJ SOLN
INTRAMUSCULAR | Status: DC | PRN
  Administered 2022-06-16: 1 mg via INTRAVENOUS

## 2022-06-16 MED ORDER — SODIUM CHLORIDE 0.9 % IV SOLN
250.0000 mL | INTRAVENOUS | Status: DC | PRN
  Administered 2022-06-16: 250 mL via INTRAVENOUS

## 2022-06-16 MED ORDER — ASPIRIN 81 MG PO CHEW: 81.0000 mg | CHEWABLE_TABLET | ORAL | Status: DC

## 2022-06-16 MED ORDER — SODIUM CHLORIDE 0.9 % WEIGHT BASED INFUSION: 1.0000 mL/kg/h | INTRAVENOUS | Status: DC

## 2022-06-16 MED ORDER — HYDRALAZINE HCL 20 MG/ML IJ SOLN: 10.0000 mg | INTRAMUSCULAR | Status: AC | PRN

## 2022-06-16 MED ORDER — SODIUM CHLORIDE 0.9 % IV SOLN: INTRAVENOUS | Status: AC

## 2022-06-16 MED ORDER — FENTANYL CITRATE (PF) 100 MCG/2ML IJ SOLN
INTRAMUSCULAR | Status: DC | PRN
  Administered 2022-06-16: 25 ug via INTRAVENOUS

## 2022-06-16 MED ORDER — SODIUM CHLORIDE 0.9 % WEIGHT BASED INFUSION
3.0000 mL/kg/h | INTRAVENOUS | Status: AC
  Administered 2022-06-16: 3 mL/kg/h via INTRAVENOUS

## 2022-06-16 MED ORDER — SODIUM CHLORIDE 0.9% FLUSH: 3.0000 mL | INTRAVENOUS | Status: DC | PRN

## 2022-06-16 MED ORDER — SODIUM CHLORIDE 0.9 % IV SOLN: 250.0000 mL | INTRAVENOUS | Status: DC | PRN

## 2022-06-16 MED ORDER — SODIUM CHLORIDE 0.9 % WEIGHT BASED INFUSION: 3.0000 mL/kg/h | INTRAVENOUS | Status: DC

## 2022-06-16 MED ORDER — VERAPAMIL HCL 2.5 MG/ML IV SOLN
INTRAVENOUS | Status: DC | PRN
  Administered 2022-06-16: 10 mL via INTRA_ARTERIAL

## 2022-06-16 MED ORDER — MIDAZOLAM HCL 2 MG/2ML IJ SOLN
INTRAMUSCULAR | Status: AC
  Filled 2022-06-16: qty 2

## 2022-06-16 MED ORDER — SODIUM CHLORIDE 0.9% FLUSH
3.0000 mL | Freq: Two times a day (BID) | INTRAVENOUS | Status: DC
  Administered 2022-06-16: 3 mL via INTRAVENOUS

## 2022-06-16 MED ORDER — LIDOCAINE HCL (PF) 1 % IJ SOLN
INTRAMUSCULAR | Status: DC | PRN
  Administered 2022-06-16: 3 mL via INTRADERMAL

## 2022-06-16 MED ORDER — HEPARIN (PORCINE) IN NACL 1000-0.9 UT/500ML-% IV SOLN
INTRAVENOUS | Status: DC | PRN
  Administered 2022-06-16 (×2): 500 mL

## 2022-06-16 MED ORDER — IOHEXOL 350 MG/ML SOLN
INTRAVENOUS | Status: DC | PRN
  Administered 2022-06-16: 40 mL

## 2022-06-16 MED ORDER — LABETALOL HCL 5 MG/ML IV SOLN: 10.0000 mg | INTRAVENOUS | Status: AC | PRN

## 2022-06-16 MED ORDER — LIDOCAINE HCL (PF) 1 % IJ SOLN
INTRAMUSCULAR | Status: AC
  Filled 2022-06-16: qty 30

## 2022-06-16 MED ORDER — HEPARIN SODIUM (PORCINE) 1000 UNIT/ML IJ SOLN
INTRAMUSCULAR | Status: AC
  Filled 2022-06-16: qty 10

## 2022-06-16 MED ORDER — APIXABAN 5 MG PO TABS
5.0000 mg | ORAL_TABLET | Freq: Two times a day (BID) | ORAL | Status: DC
  Administered 2022-06-16 – 2022-06-17 (×2): 5 mg via ORAL
  Filled 2022-06-16 (×2): qty 1

## 2022-06-16 MED ORDER — FENTANYL CITRATE (PF) 100 MCG/2ML IJ SOLN
INTRAMUSCULAR | Status: AC
  Filled 2022-06-16: qty 2

## 2022-06-16 MED ORDER — VERAPAMIL HCL 2.5 MG/ML IV SOLN
INTRAVENOUS | Status: AC
  Filled 2022-06-16: qty 2

## 2022-06-16 SURGICAL SUPPLY — 11 items
CATH OPTITORQUE TIG 4.0 5F (CATHETERS) IMPLANT
DEVICE RAD COMP TR BAND LRG (VASCULAR PRODUCTS) IMPLANT
GLIDESHEATH SLEND SS 6F .021 (SHEATH) IMPLANT
GUIDEWIRE INQWIRE 1.5J.035X260 (WIRE) IMPLANT
INQWIRE 1.5J .035X260CM (WIRE) ×1
KIT HEART LEFT (KITS) ×1 IMPLANT
PACK CARDIAC CATHETERIZATION (CUSTOM PROCEDURE TRAY) ×1 IMPLANT
SHEATH PROBE COVER 6X72 (BAG) IMPLANT
SYR MEDRAD MARK 7 150ML (SYRINGE) ×1 IMPLANT
TRANSDUCER W/STOPCOCK (MISCELLANEOUS) ×1 IMPLANT
TUBING CIL FLEX 10 FLL-RA (TUBING) ×1 IMPLANT

## 2022-06-16 NOTE — Progress Notes (Signed)
EKG completed this morning but it did not transfer over to the chart. Physical copy placed in paper chart. Patient remains in Afib. Denies chest pain.

## 2022-06-16 NOTE — Progress Notes (Signed)
Progress Note  Patient Name: Eric Benitez Date of Encounter: 06/16/2022  Primary Cardiologist: Dina Rich, MD  Interval Summary   No chest pressure overnight but did have some neck discomfort.  No palpitations or shortness of breath at rest.  He is n.p.o. in anticipation of cardiac catheterization later this afternoon.  Vital Signs    Vitals:   06/15/22 1453 06/15/22 2036 06/16/22 0147 06/16/22 0401  BP: (!) 151/92 109/76 (!) 146/82 110/61  Pulse: 87 81  86  Resp:   19 18  Temp: 98.1 F (36.7 C) 97.6 F (36.4 C) 98.3 F (36.8 C) 98.1 F (36.7 C)  TempSrc: Oral Oral    SpO2: 99% 99% 97% 99%  Weight:    72.3 kg  Height:        Intake/Output Summary (Last 24 hours) at 06/16/2022 0846 Last data filed at 06/16/2022 0300 Gross per 24 hour  Intake 480 ml  Output 700 ml  Net -220 ml   Filed Weights   06/15/22 1019 06/16/22 0401  Weight: 74.8 kg 72.3 kg    Physical Exam   GEN: No acute distress.   Neck: No JVD. Cardiac: Irregularly irregular without gallop, 1/6 systolic murmur.  Respiratory: Nonlabored.  Decreased breath sounds without wheezing. GI: Soft, nontender, bowel sounds present. MS: No edema. Neuro:  Nonfocal. Psych: Alert and oriented x 3. Normal affect.  ECG/Telemetry   An ECG dated 06/16/2022 was personally reviewed today and demonstrated:  Rate controlled atrial fibrillation.  Telemetry reviewed showing atrial fibrillation.  Labs    Chemistry Recent Labs  Lab 06/13/22 1019 06/15/22 1003 06/16/22 0713  NA 133* 129* 133*  K 4.3 4.2 4.4  CL 101 97* 100  CO2 21* 21* 24  GLUCOSE 106* 121* 108*  BUN 18 24* 26*  CREATININE 1.07 1.28* 1.29*  CALCIUM 9.0 8.9 8.7*  PROT 8.1 7.5  --   ALBUMIN 3.7 3.5  --   AST 26 24  --   ALT 23 23  --   ALKPHOS 70 66  --   BILITOT 0.8 0.5  --   GFRNONAA >60 56* 56*  ANIONGAP 11 11 9     Hematology Recent Labs  Lab 06/13/22 1019 06/15/22 1003  WBC 7.5 9.1  RBC 4.02* 3.91*  HGB 13.0 12.6*   HCT 37.9* 36.8*  MCV 94.3 94.1  MCH 32.3 32.2  MCHC 34.3 34.2  RDW 13.3 13.2  PLT 238 275   Cardiac Enzymes Recent Labs  Lab 06/13/22 1030 06/13/22 1231 06/15/22 1003 06/15/22 1323  TROPONINIHS 10 11 16 17    Lipid Panel     Component Value Date/Time   CHOL 123 06/05/2018 0539   TRIG 37 06/05/2018 0539   HDL 50 06/05/2018 0539   CHOLHDL 2.5 06/05/2018 0539   VLDL 7 06/05/2018 0539   LDLCALC 66 06/05/2018 0539    Cardiac Studies   Echocardiogram pending.  Assessment & Plan   1.  Unstable angina, high-sensitivity troponin I levels remain normal.  No recurrent chest pressure but has had some neck pain with mixed features.  Follow-up echocardiogram is pending.  2.  Persistent atrial fibrillation and history of typical atrial flutter with CHA2DS2-VASc score of 5.  He underwent cardioversion in 2020 and was in sinus rhythm as of August 2023 but back in atrial fibrillation of the last several months and not entirely clear how much this contributes to interval symptomatology.  Rates are adequately controlled and he has been on Toprol-XL along with Eliquis (held  for cardiac catheterization).  3.  Renal insufficiency, creatinine 1.29 and potassium normal.  Recent outpatient Lasix dosing has been held.  4.  Mixed hyperlipidemia on Lipitor 80 mg daily.  LDL was 80 in July of last year.  Would repeat lipid panel.  5.  COPD and possible interstitial lung disease.  Discussed with patient and hospitalist team.  Plan is for diagnostic cardiac catheterization later this afternoon at [facility], transfer being arranged to the hospitalist service.  Continue n.p.o. status for now.  Eliquis has been held, last dose was yesterday morning.  Continue aspirin, Lipitor, Toprol-XL, and Ranexa.  Echocardiogram is pending as well.  For questions or updates, please contact [facility] Please consult [email or web address] for contact info under   Signed, Nona Dell, MD  06/16/2022,  8:46 AM

## 2022-06-16 NOTE — Progress Notes (Signed)
Pt being transferred to bed. 

## 2022-06-16 NOTE — Progress Notes (Signed)
  Echocardiogram 2D Echocardiogram has been performed.  Eric Benitez 06/16/2022, 10:30 AM

## 2022-06-16 NOTE — Progress Notes (Signed)
  Transition of Care [facility]) Screening Note   Patient Details  Name: [name] Date of Birth: February 29, 1940   [facility] of [facility]) CM/SW Contact:    Villa Herb, LCSWA Phone Number: 06/16/2022, 10:26 AM    Transition of Care Department [facility]) has reviewed patient and no TOC needs have been identified at this time. We will continue to monitor patient advancement through interdisciplinary progression rounds. If new patient transition needs arise, please place a TOC consult.

## 2022-06-16 NOTE — Progress Notes (Signed)
[facility] PROGRESS NOTE  Eric Benitez (DOB: 1940-03-06) ZOX:[id number] PCP: Richardean Chimera, MD  Brief Narrative: Eric Benitez is an 82 y.o. male with a history of IPF, CAD s/p LAD stent, HTN, HLD who presented to the ED on 06/15/2022 with worsening exertional chest tightness on chronic progressive exertional dyspnea. Work up revealed rate-controlled AFib without ischemic features and normal troponin x2, though with his history and complaints reminiscent of prior MI, he was admitted with cardiology consultation for concern for unstable angina. Cardiac catheterization is planned 5/10 as well as repeat echocardiogram.   Subjective: Had trouble sleeping with off and on left neck pain. Denies dyspnea at rest, no chest pain currently. No bleeding, palpitations, or other complaints.   Objective: BP 110/61   Pulse 86   Temp 98.1 F (36.7 C)   Resp 18   Ht 5\' 9"  (1.753 m)   Wt 72.3 kg   SpO2 99%   BMI 23.54 kg/m   Gen: Elderly pleasant male in no distress Pulm: Crackles diffusely, nonlabored on room air.  CV: Irreg irreg, no MRG, trace pitting edema. GI: Soft, NT, ND, +BS Neuro: Alert and oriented. No new focal deficits. Ext: Warm, no deformities. Skin: No rashes, lesions or ulcers on visualized skin   Assessment & Plan: CAD with exertional chest tightness concerning for unstable angina:  - Continue aspirin, metoprolol succinate 50mg , ranexa, high-intensity statin (LDL 80 in July 2023, will recheck with fasting AM labs).  - Continue prn morphine, NTG - No heparin gtt per cardiology, awaiting transfer to [facility] for diagnostic cardiac catheterization 5/10. Holding eliquis. Remains NPO.  - Echocardiogram ordered and pending.    - Continue atorvastatin, LDL 80 July 2023, metoprolol, ranexa.   HTN:  - Continue metoprolol succinate 50mg . Currently normotensive with mild AKI and will receive contrast today, so holding ARB for now.   IPF: This explains a significant amount of  exertional dyspnea that is chronic and progressive.  - Continue OFEV per [facility], Dr. Vassie Loll. Pt may not be taking this medication currently per pharmacy med rec. Can have prn albuterol though per pt, there was no significant COPD on testing. He has no wheezing currently, at baseline..    Persistent AFib: Rate controlled, had maintained NSR as of August 2023 after DCCV 2020.  - Continue metoprolol, hold eliquis.    AKI: Mild. SCr 1.28. May have been slightly over diuresed.  - Hold further lasix. Recheck post cath.   DNR: POA. Discussed with patient and spouse at length at admission with chaplain present. No restriction on care prior to the event of a cardiac/respiratory arrest however.    Hyponatremia: Improving, asymptomatic.    BPH with LUTS:   - Continue doxazosin.  - Urology follow up  Tyrone Nine, MD [facility] [email or web address] 06/16/2022, 11:17 AM

## 2022-06-16 NOTE — Interval H&P Note (Signed)
History and Physical Interval Note: Cath Lab Visit (complete for each Cath Lab visit)  Clinical Evaluation Leading to the Procedure:   ACS: Yes.    Non-ACS:    Anginal Classification: CCS III  Anti-ischemic medical therapy: Maximal Therapy (2 or more classes of medications)  Non-Invasive Test Results: No non-invasive testing performed  Prior CABG: No previous CABG       06/16/2022 2:55 PM  Eric Benitez  has presented today for surgery, with the diagnosis of chest pain - Unstable Angina.  The various methods of treatment have been discussed with the patient and family. After consideration of risks, benefits and other options for treatment, the patient has consented to  Procedure(s): LEFT HEART CATH AND CORONARY ANGIOGRAPHY (N/A) PERCUTANEOUS CORONARY INTERVENTION   as a surgical intervention.  The patient's history has been reviewed, patient examined, no change in status, stable for surgery.  I have reviewed the patient's chart and labs.  Questions were answered to the patient's satisfaction.       Bryan Lemma

## 2022-06-16 NOTE — Progress Notes (Signed)
   06/16/22 1800  ECG Monitoring  ECG Heart Rate 77  First Vascular Site Assessment  #1 - Location of Site Assessment Right radial  #1 - Vascular Site Assessment Scale Level 0  #1 - Air in Band  (removed)  #1 - Hematoma present? No  #1 - Dressing Type Gauze (Comment)  #1 - Dressing Status Clean, Dry, Intact     TR band removed per protocol. No complications.Patient denies any pain.

## 2022-06-16 NOTE — H&P (View-Only) (Signed)
 Progress Note  Patient Name: Eric Benitez Date of Encounter: 06/16/2022  Primary Cardiologist: Branch, Jonathan, MD  Interval Summary   No chest pressure overnight but did have some neck discomfort.  No palpitations or shortness of breath at rest.  He is n.p.o. in anticipation of cardiac catheterization later this afternoon.  Vital Signs    Vitals:   06/15/22 1453 06/15/22 2036 06/16/22 0147 06/16/22 0401  BP: (!) 151/92 109/76 (!) 146/82 110/61  Pulse: 87 81  86  Resp:   19 18  Temp: 98.1 F (36.7 C) 97.6 F (36.4 C) 98.3 F (36.8 C) 98.1 F (36.7 C)  TempSrc: Oral Oral    SpO2: 99% 99% 97% 99%  Weight:    72.3 kg  Height:        Intake/Output Summary (Last 24 hours) at 06/16/2022 0846 Last data filed at 06/16/2022 0300 Gross per 24 hour  Intake 480 ml  Output 700 ml  Net -220 ml   Filed Weights   06/15/22 1019 06/16/22 0401  Weight: 74.8 kg 72.3 kg    Physical Exam   GEN: No acute distress.   Neck: No JVD. Cardiac: Irregularly irregular without gallop, 1/6 systolic murmur.  Respiratory: Nonlabored.  Decreased breath sounds without wheezing. GI: Soft, nontender, bowel sounds present. MS: No edema. Neuro:  Nonfocal. Psych: Alert and oriented x 3. Normal affect.  ECG/Telemetry   An ECG dated 06/16/2022 was personally reviewed today and demonstrated:  Rate controlled atrial fibrillation.  Telemetry reviewed showing atrial fibrillation.  Labs    Chemistry Recent Labs  Lab 06/13/22 1019 06/15/22 1003 06/16/22 0713  NA 133* 129* 133*  K 4.3 4.2 4.4  CL 101 97* 100  CO2 21* 21* 24  GLUCOSE 106* 121* 108*  BUN 18 24* 26*  CREATININE 1.07 1.28* 1.29*  CALCIUM 9.0 8.9 8.7*  PROT 8.1 7.5  --   ALBUMIN 3.7 3.5  --   AST 26 24  --   ALT 23 23  --   ALKPHOS 70 66  --   BILITOT 0.8 0.5  --   GFRNONAA >60 56* 56*  ANIONGAP 11 11 9    Hematology Recent Labs  Lab 06/13/22 1019 06/15/22 1003  WBC 7.5 9.1  RBC 4.02* 3.91*  HGB 13.0 12.6*   HCT 37.9* 36.8*  MCV 94.3 94.1  MCH 32.3 32.2  MCHC 34.3 34.2  RDW 13.3 13.2  PLT 238 275   Cardiac Enzymes Recent Labs  Lab 06/13/22 1030 06/13/22 1231 06/15/22 1003 06/15/22 1323  TROPONINIHS 10 11 16 17   Lipid Panel     Component Value Date/Time   CHOL 123 06/05/2018 0539   TRIG 37 06/05/2018 0539   HDL 50 06/05/2018 0539   CHOLHDL 2.5 06/05/2018 0539   VLDL 7 06/05/2018 0539   LDLCALC 66 06/05/2018 0539    Cardiac Studies   Echocardiogram pending.  Assessment & Plan   1.  Unstable angina, high-sensitivity troponin I levels remain normal.  No recurrent chest pressure but has had some neck pain with mixed features.  Follow-up echocardiogram is pending.  2.  Persistent atrial fibrillation and history of typical atrial flutter with CHA2DS2-VASc score of 5.  He underwent cardioversion in 2020 and was in sinus rhythm as of August 2023 but back in atrial fibrillation of the last several months and not entirely clear how much this contributes to interval symptomatology.  Rates are adequately controlled and he has been on Toprol-XL along with Eliquis (held   for cardiac catheterization).  3.  Renal insufficiency, creatinine 1.29 and potassium normal.  Recent outpatient Lasix dosing has been held.  4.  Mixed hyperlipidemia on Lipitor 80 mg daily.  LDL was 80 in July of last year.  Would repeat lipid panel.  5.  COPD and possible interstitial lung disease.  Discussed with patient and hospitalist team.  Plan is for diagnostic cardiac catheterization later this afternoon at [facility], transfer being arranged to the hospitalist service.  Continue n.p.o. status for now.  Eliquis has been held, last dose was yesterday morning.  Continue aspirin, Lipitor, Toprol-XL, and Ranexa.  Echocardiogram is pending as well.  For questions or updates, please contact [facility] Please consult [email or web address] for contact info under   Signed, Tony Friscia, MD  06/16/2022,  8:46 AM    

## 2022-06-17 DIAGNOSIS — I4821 Permanent atrial fibrillation: Secondary | ICD-10-CM

## 2022-06-17 DIAGNOSIS — I2510 Atherosclerotic heart disease of native coronary artery without angina pectoris: Secondary | ICD-10-CM | POA: Diagnosis not present

## 2022-06-17 DIAGNOSIS — J84112 Idiopathic pulmonary fibrosis: Secondary | ICD-10-CM | POA: Diagnosis not present

## 2022-06-17 DIAGNOSIS — Z7901 Long term (current) use of anticoagulants: Secondary | ICD-10-CM | POA: Diagnosis not present

## 2022-06-17 DIAGNOSIS — R079 Chest pain, unspecified: Secondary | ICD-10-CM | POA: Diagnosis not present

## 2022-06-17 LAB — BASIC METABOLIC PANEL
Anion gap: 7 (ref 5–15)
BUN: 21 mg/dL (ref 8–23)
CO2: 25 mmol/L (ref 22–32)
Calcium: 8.8 mg/dL — ABNORMAL LOW (ref 8.9–10.3)
Chloride: 104 mmol/L (ref 98–111)
Creatinine, Ser: 1.38 mg/dL — ABNORMAL HIGH (ref 0.61–1.24)
GFR, Estimated: 51 mL/min — ABNORMAL LOW (ref >60)
Glucose, Bld: 103 mg/dL — ABNORMAL HIGH (ref 70–99)
Potassium: 4.9 mmol/L (ref 3.5–5.1)
Sodium: 136 mmol/L (ref 135–145)

## 2022-06-17 LAB — LIPID PANEL
Cholesterol: 124 mg/dL (ref 0–200)
HDL: 41 mg/dL (ref >40)
LDL Cholesterol: 74 mg/dL (ref 0–99)
Total CHOL/HDL Ratio: 3 RATIO
Triglycerides: 46 mg/dL (ref <150)
VLDL: 9 mg/dL (ref 0–40)

## 2022-06-17 LAB — CBC
HCT: 35.5 % — ABNORMAL LOW (ref 39.0–52.0)
Hemoglobin: 11.8 g/dL — ABNORMAL LOW (ref 13.0–17.0)
MCH: 31.1 pg (ref 26.0–34.0)
MCHC: 33.2 g/dL (ref 30.0–36.0)
MCV: 93.7 fL (ref 80.0–100.0)
Platelets: 248 10*3/uL (ref 150–400)
RBC: 3.79 MIL/uL — ABNORMAL LOW (ref 4.22–5.81)
RDW: 13.1 % (ref 11.5–15.5)
WBC: 9.1 10*3/uL (ref 4.0–10.5)
nRBC: 0 % (ref 0.0–0.2)

## 2022-06-17 LAB — GLUCOSE, CAPILLARY: Glucose-Capillary: 120 mg/dL — ABNORMAL HIGH (ref 70–99)

## 2022-06-17 MED ORDER — PREDNISONE 20 MG PO TABS
60.0000 mg | ORAL_TABLET | Freq: Every day | ORAL | Status: DC
  Administered 2022-06-17: 60 mg via ORAL
  Filled 2022-06-17: qty 3

## 2022-06-17 MED ORDER — PREDNISONE 20 MG PO TABS: ORAL_TABLET | ORAL | 0 refills | Status: DC

## 2022-06-17 NOTE — Progress Notes (Signed)
Progress Note  Patient Name: Kathryn Hubanks Furr Date of Encounter: 06/17/2022  Primary Cardiologist: Dina Rich, MD  Interval Summary   Doing well today. Does note some chest tightness with deep breathing.  Vital Signs    Vitals:   06/17/22 0015 06/17/22 0346 06/17/22 0940 06/17/22 1150  BP: 117/63 (!) 142/83 126/79 134/76  Pulse: 68 75 80 75  Resp:  20  16  Temp: 97.8 F (36.6 C) 98 F (36.7 C)  98.1 F (36.7 C)  TempSrc: Oral Oral  Oral  SpO2: 98% 98%  95%  Weight:      Height:        Intake/Output Summary (Last 24 hours) at 06/17/2022 1330 Last data filed at 06/17/2022 1153 Gross per 24 hour  Intake 402.22 ml  Output 1700 ml  Net -1297.78 ml   Filed Weights   06/15/22 1019 06/16/22 0401  Weight: 74.8 kg 72.3 kg    Physical Exam   GEN: No acute distress.   Neck: No JVD. Cardiac: Irregularly irregular without gallop, 1/6 systolic murmur.  Respiratory: Nonlabored.  Decreased breath sounds without wheezing. GI: Soft, nontender, bowel sounds present. MS: No edema. Neuro:  Nonfocal. Psych: Alert and oriented x 3. Normal affect.  ECG/Telemetry   An ECG dated 06/16/2022 was personally reviewed today and demonstrated:  Rate controlled atrial fibrillation.  Telemetry reviewed showing atrial fibrillation.  Labs    Chemistry Recent Labs  Lab 06/13/22 1019 06/15/22 1003 06/16/22 0713 06/17/22 0214  NA 133* 129* 133* 136  K 4.3 4.2 4.4 4.9  CL 101 97* 100 104  CO2 21* 21* 24 25  GLUCOSE 106* 121* 108* 103*  BUN 18 24* 26* 21  CREATININE 1.07 1.28* 1.29* 1.38*  CALCIUM 9.0 8.9 8.7* 8.8*  PROT 8.1 7.5  --   --   ALBUMIN 3.7 3.5  --   --   AST 26 24  --   --   ALT 23 23  --   --   ALKPHOS 70 66  --   --   BILITOT 0.8 0.5  --   --   GFRNONAA >60 56* 56* 51*  ANIONGAP 11 11 9 7     Hematology Recent Labs  Lab 06/13/22 1019 06/15/22 1003 06/17/22 0214  WBC 7.5 9.1 9.1  RBC 4.02* 3.91* 3.79*  HGB 13.0 12.6* 11.8*  HCT 37.9* 36.8* 35.5*   MCV 94.3 94.1 93.7  MCH 32.3 32.2 31.1  MCHC 34.3 34.2 33.2  RDW 13.3 13.2 13.1  PLT 238 275 248   Cardiac Enzymes Recent Labs  Lab 06/13/22 1030 06/13/22 1231 06/15/22 1003 06/15/22 1323  TROPONINIHS 10 11 16 17    Lipid Panel     Component Value Date/Time   CHOL 124 06/17/2022 0214   TRIG 46 06/17/2022 0214   HDL 41 06/17/2022 0214   CHOLHDL 3.0 06/17/2022 0214   VLDL 9 06/17/2022 0214   LDLCALC 74 06/17/2022 0214    Cardiac Studies   Echocardiogram 5/10: EF 65 to 70%, no regional wall motion abnormalities.  Normal RV function.  Mild mitral regurgitation.  Mild atrial regurgitation.  Coronary angiogram 5/10:   Mid LAD-2 lesion is 20% stenosed.   Ost 1st Diag lesion is 60% stenosed.   Non-stenotic Mid LAD-1 lesion was previously treated.   LV end diastolic pressure is normal.   There is no aortic valve stenosis.  Assessment & Plan   1.  Unstable angina: - cath yesterday with angiographically stable coronary arteries, widely patent  LAD stent -Continue Ranexa, aspirin, Lipitor, metoprolol XL  2.  Persistent atrial fibrillation and history of typical atrial flutter with CHA2DS2-VASc score of 5.  He underwent cardioversion in 2020 and was in sinus rhythm as of August 2023 but back in atrial fibrillation of the last several months and not entirely clear how much this contributes to interval symptomatology.  Rates are adequately controlled and he has been on Toprol-XL along with Eliquis   3.  Renal insufficiency, creatinine 1.29 and potassium normal.  Recent outpatient Lasix dosing has been held.  4.  Mixed hyperlipidemia on Lipitor 80 mg daily.  LDL 74  5.  COPD and possible interstitial lung disease.  With angiographically stable coronary arteries and stable AF, cardiology will sign off. Please call with questions of concerns.   For questions or updates, please contact [facility] Please consult [email or web address] for contact info under   Signed, Maurice Small, MD  06/17/2022, 1:30 PM

## 2022-06-17 NOTE — Discharge Summary (Signed)
[facility]  Physician Discharge Summary   Patient ID: Eric Benitez MRN: [id number] DOB/AGE: [birth year] 82 y.o.  Admit date: 06/15/2022 Discharge date: 06/17/2022    PCP: Richardean Chimera, MD  DISCHARGE DIAGNOSES:    Essential hypertension   Hyperlipidemia with target LDL less than 70   Coronary artery disease involving native coronary artery of native heart   Chronic anticoagulation   IPF (idiopathic pulmonary fibrosis) (HCC)   Chronic diastolic heart failure (HCC)   RECOMMENDATIONS FOR OUTPATIENT FOLLOW UP: Patient encouraged to keep his appointment with the pulmonologist next week Cardiology to arrange outpatient follow-up   Home Health: None Equipment/Devices: None  CODE STATUS: Full code  DISCHARGE CONDITION: fair  Diet recommendation: As before  INITIAL HISTORY: 82 y.o. male with a history of IPF, CAD s/p LAD stent, HTN, HLD who presented to the ED on 06/15/2022 with worsening exertional chest tightness on chronic progressive exertional dyspnea. Work up revealed rate-controlled AFib without ischemic features and normal troponin x2, though with his history and complaints reminiscent of prior MI, he was admitted with cardiology consultation for concern for unstable angina. Cardiac catheterization is planned 5/10 as well as repeat echocardiogram.   Consultations: Cardiology  Procedures: Cardiac catheterization  HOSPITAL COURSE:   Patient presented with chest tightness and shortness of breath.  Concern was for unstable angina.  Patient was seen by cardiology.  Underwent echocardiogram and cardiac catheterization.  No significant CAD was noted.  Cardiology recommended continuing current home medication regimen.  Patient felt better.    However he did mention ongoing exertional dyspnea.  Patient does have history of pulmonary fibrosis.  He is followed by pulmonology.  Has an appointment next week.  Wonder if his symptoms are due to pulmonary fibrosis.  Might  benefit from a short course of steroids.  This has been ordered.  He has been encouraged to keep his appointment with his pulmonologist to discuss further options.  It looks like patient used to be on Ofev previously but does not appear to be taking it currently.  Found to have elevated creatinine.  Noted to be higher than his usual baseline.  Has been producing urine.  Avoid nephrotoxic agents.  Will need outpatient monitoring.    Hyponatremia has improved.  His other medical issues including essential hypertension, persistent atrial fibrillation, BPH are all stable.  Patient is stable.  Cleared by cardiology.  Okay for discharge home today.  PERTINENT LABS:  The results of significant diagnostics from this hospitalization (including imaging, microbiology, ancillary and laboratory) are listed below for reference.    Microbiology: Recent Results (from the past 240 hour(s))  SARS Coronavirus 2 by RT PCR (hospital order, performed in [facility] hospital lab) *cepheid single result test* Anterior Nasal Swab     Status: None   Collection Time: 06/15/22 11:57 AM   Specimen: Anterior Nasal Swab  Result Value Ref Range Status   SARS Coronavirus 2 by RT PCR NEGATIVE NEGATIVE Final    Comment: (NOTE) SARS-CoV-2 target nucleic acids are NOT DETECTED.  The SARS-CoV-2 RNA is generally detectable in upper and lower respiratory specimens during the acute phase of infection. The lowest concentration of SARS-CoV-2 viral copies this assay can detect is 250 copies / mL. A negative result does not preclude SARS-CoV-2 infection and should not be used as the sole basis for treatment or other patient management decisions.  A negative result may occur with improper specimen collection / handling, submission of specimen other than nasopharyngeal swab, presence of viral  mutation(s) within the areas targeted by this assay, and inadequate number of viral copies (<250 copies / mL). A negative result must be  combined with clinical observations, patient history, and epidemiological information.  Fact Sheet for Patients:   RoadLapTop.co.za  Fact Sheet for Healthcare Providers: http://kim-miller.com/  This test is not yet approved or  cleared by the [place] FDA and has been authorized for detection and/or diagnosis of SARS-CoV-2 by FDA under an Emergency Use Authorization (EUA).  This EUA will remain in effect (meaning this test can be used) for the duration of the COVID-19 declaration under Section 564(b)(1) of the Act, 21 U.S.C. section 360bbb-3(b)(1), unless the authorization is terminated or revoked sooner.  Performed at [facility], [address] [place], [place] [address]      Labs:   Basic Metabolic Panel: Recent Labs  Lab 06/13/22 1019 06/15/22 1003 06/16/22 0713 06/17/22 0214  NA 133* 129* 133* 136  K 4.3 4.2 4.4 4.9  CL 101 97* 100 104  CO2 21* 21* 24 25  GLUCOSE 106* 121* 108* 103*  BUN 18 24* 26* 21  CREATININE 1.07 1.28* 1.29* 1.38*  CALCIUM 9.0 8.9 8.7* 8.8*   Liver Function Tests: Recent Labs  Lab 06/13/22 1019 06/15/22 1003  AST 26 24  ALT 23 23  ALKPHOS 70 66  BILITOT 0.8 0.5  PROT 8.1 7.5  ALBUMIN 3.7 3.5    CBC: Recent Labs  Lab 06/13/22 1019 06/15/22 1003 06/17/22 0214  WBC 7.5 9.1 9.1  NEUTROABS 4.4 5.5  --   HGB 13.0 12.6* 11.8*  HCT 37.9* 36.8* 35.5*  MCV 94.3 94.1 93.7  PLT 238 275 248    BNP: BNP (last 3 results) Recent Labs    06/13/22 1030 06/15/22 1026  BNP 529.0* 167.0*    CBG: Recent Labs  Lab 06/16/22 2249 06/17/22 1149  GLUCAP 120* 120*     IMAGING STUDIES CARDIAC CATHETERIZATION  Result Date: 06/16/2022   Mid LAD-2 lesion is 20% stenosed.   Ost 1st Diag lesion is 60% stenosed.   Non-stenotic Mid LAD-1 lesion was previously treated.   LV end diastolic pressure is normal.   There is no aortic valve stenosis. POST-CATH DIAGNOSES Angiographically stable  coronary arteries with widely patent stent in the LAD. Normal LVEDP RECOMMENDATIONS Evaluate for nonmacrovascular/anginal etiology for chest pain versus microvascular angina Consider involvement of atrial fibrillation. Defer to primary rounding cardiology team. Bryan Lemma, MD   ECHOCARDIOGRAM COMPLETE  Result Date: 06/16/2022    ECHOCARDIOGRAM REPORT   Patient Name:   Eric Benitez Date of Exam: 06/16/2022 Medical Rec #:  [id number]       Height:       69.0 in Accession #:    [id number]      Weight:       159.4 lb Date of Birth:  11/25/1940      BSA:          1.876 m Patient Age:    81 years        BP:           110/61 mmHg Patient Gender: M               HR:           68 bpm. [facility] Procedure: 2D Echo, 3D Echo, Cardiac Doppler, Color Doppler and Strain Analysis Indications:    Chest Pain R07.9  History:        Patient has prior history of  Echocardiogram examinations, most                 recent 05/05/2021. CHF, CAD, Signs/Symptoms:Chest Pain; Risk                 Factors:Hypertension, Dyslipidemia, Former Smoker and Sleep                 Apnea.  Sonographer:    Aron Baba Referring Phys: [id number] [address] Nine  Sonographer Comments: Image acquisition challenging due to respiratory motion. Global longitudinal strain was attempted. IMPRESSIONS  1. Left ventricular ejection fraction, by estimation, is 65 to 70%. The left ventricle has normal function. The left ventricle has no regional wall motion abnormalities. There is mild concentric left ventricular hypertrophy. Left ventricular diastolic parameters are indeterminate. The average left ventricular global longitudinal strain is -16.4 %. The global longitudinal strain is normal.  2. Right ventricular systolic function is normal. The right ventricular size is normal. There is normal pulmonary artery systolic pressure. The estimated right ventricular systolic pressure is 28.4 mmHg.  3. Left atrial size was mildly dilated.  4. Right atrial size  was mildly dilated.  5. The mitral valve is degenerative. Mild mitral valve regurgitation.  6. The aortic valve is tricuspid. Aortic valve regurgitation is mild.  7. The inferior vena cava is normal in size with greater than 50% respiratory variability, suggesting right atrial pressure of 3 mmHg. Comparison(s): Prior images reviewed side by side. LVEF remains vigorous at 65-70%. FINDINGS  Left Ventricle: Left ventricular ejection fraction, by estimation, is 65 to 70%. The left ventricle has normal function. The left ventricle has no regional wall motion abnormalities. The average left ventricular global longitudinal strain is -16.4 %. The global longitudinal strain is normal. The left ventricular internal cavity size was normal in size. There is mild concentric left ventricular hypertrophy. Left ventricular diastolic function could not be evaluated due to atrial fibrillation. Left ventricular diastolic parameters are indeterminate. Right Ventricle: The right ventricular size is normal. No increase in right ventricular wall thickness. Right ventricular systolic function is normal. There is normal pulmonary artery systolic pressure. The tricuspid regurgitant velocity is 2.52 m/s, and  with an assumed right atrial pressure of 3 mmHg, the estimated right ventricular systolic pressure is 28.4 mmHg. Left Atrium: Left atrial size was mildly dilated. Right Atrium: Right atrial size was mildly dilated. Pericardium: There is no evidence of pericardial effusion. Mitral Valve: The mitral valve is degenerative in appearance. Mild mitral valve regurgitation. Tricuspid Valve: The tricuspid valve is grossly normal. Tricuspid valve regurgitation is trivial. Aortic Valve: The aortic valve is tricuspid. There is mild to moderate aortic valve annular calcification. Aortic valve regurgitation is mild. Pulmonic Valve: The pulmonic valve was not well visualized. Pulmonic valve regurgitation is trivial. Aorta: The aortic root is normal in  size and structure. Venous: The inferior vena cava is normal in size with greater than 50% respiratory variability, suggesting right atrial pressure of 3 mmHg. IAS/Shunts: No atrial level shunt detected by color flow Doppler.  LEFT VENTRICLE PLAX 2D LVIDd:         3.30 cm   Diastology LVIDs:         2.20 cm   LV e' medial:    7.62 cm/s LV PW:         1.20 cm   LV E/e' medial:  14.3 LV IVS:        1.10 cm   LV e' lateral:   9.20 cm/s LVOT diam:  1.60 cm   LV E/e' lateral: 11.8 LV SV:         27 LV SV Index:   15        2D Longitudinal Strain LVOT Area:     2.01 cm  2D Strain GLS Avg:     -16.4 %                           3D Volume EF:                          3D EF:        51 %                          LV EDV:       109 ml                          LV ESV:       53 ml                          LV SV:        56 ml RIGHT VENTRICLE RV S prime:     9.96 cm/s TAPSE (M-mode): 1.6 cm LEFT ATRIUM             Index        RIGHT ATRIUM           Index LA diam:        4.10 cm 2.19 cm/m   RA Area:     23.50 cm LA Vol (A2C):   52.0 ml 27.72 ml/m  RA Volume:   67.30 ml  35.87 ml/m LA Vol (A4C):   54.6 ml 29.10 ml/m LA Biplane Vol: 54.5 ml 29.05 ml/m  AORTIC VALVE LVOT Vmax:   82.90 cm/s LVOT Vmean:  48.700 cm/s LVOT VTI:    0.136 m  AORTA Ao Root diam: 3.40 cm Ao Asc diam:  3.50 cm MITRAL VALVE                TRICUSPID VALVE MV Area (PHT): 2.91 cm     TR Peak grad:   25.4 mmHg MV Decel Time: 261 msec     TR Vmax:        252.00 cm/s MV E velocity: 109.00 cm/s                             SHUNTS                             Systemic VTI:  0.14 m                             Systemic Diam: 1.60 cm Nona Dell MD Electronically signed by Nona Dell MD Signature Date/Time: 06/16/2022/11:30:28 AM    Final    DG Chest 2 View  Result Date: 06/15/2022 CLINICAL DATA:  Chest pain and shortness of breath since this morning. EXAM: CHEST - 2 VIEW COMPARISON:  Chest radiograph 2 days prior. FINDINGS: The heart is enlarged,  unchanged. The upper mediastinal contours are normal. Coarsened interstitial markings are again seen throughout both lungs consistent with underlying chronic lung  disease. There is no new or worsening focal airspace opacity. There is no pulmonary edema. There is no pleural effusion or pneumothorax There is no acute osseous abnormality. IMPRESSION: Stable chronic lung disease without acute airspace opacity. Electronically Signed   By: Lesia Hausen M.D.   On: 06/15/2022 10:57   DG Chest 2 View  Result Date: 06/13/2022 CLINICAL DATA:  Shortness of breath, upper chest tightness, history lung disease, CHF, COPD, former smoker EXAM: CHEST - 2 VIEW COMPARISON:  10/04/2021 FINDINGS: Upper normal heart size. Mediastinal contours and pulmonary vascularity normal. COPD changes with chronic interstitial lung disease changes involving the mid to lower lungs bilaterally similar to previous exam. No definite superimposed infiltrate, pleural effusion, or pneumothorax. Multiple old RIGHT rib fractures. IMPRESSION: Changes of COPD and chronic interstitial lung disease. No acute abnormalities. Electronically Signed   By: Ulyses Southward M.D.   On: 06/13/2022 10:49    DISCHARGE EXAMINATION: Vitals:   06/17/22 0015 06/17/22 0346 06/17/22 0940 06/17/22 1150  BP: 117/63 (!) 142/83 126/79 134/76  Pulse: 68 75 80 75  Resp:  20  16  Temp: 97.8 F (36.6 C) 98 F (36.7 C)  98.1 F (36.7 C)  TempSrc: Oral Oral  Oral  SpO2: 98% 98%  95%  Weight:      Height:       General appearance: Awake alert.  In no distress Resp: Crackles bilaterally.  Normal effort at rest Cardio: S1-S2 is normal regular.  No S3-S4.  No rubs murmurs or bruit GI: Abdomen is soft.  Nontender nondistended.  Bowel sounds are present normal.  No masses organomegaly    DISPOSITION: Home  Discharge Instructions     Call MD for:  difficulty breathing, headache or visual disturbances   Complete by: As directed    Call MD for:  extreme fatigue   Complete  by: As directed    Call MD for:  persistant dizziness or light-headedness   Complete by: As directed    Call MD for:  persistant nausea and vomiting   Complete by: As directed    Call MD for:  severe uncontrolled pain   Complete by: As directed    Call MD for:  temperature >100.4   Complete by: As directed    Diet - low sodium heart healthy   Complete by: As directed    Discharge instructions   Complete by: As directed    Please take your medications as prescribed.  Please be sure to keep your appointment with the pulmonologist.  You were cared for by a hospitalist during your hospital stay. If you have any questions about your discharge medications or the care you received while you were in the hospital after you are discharged, you can call the unit and asked to speak with the hospitalist on call if the hospitalist that took care of you is not available. Once you are discharged, your primary care physician will handle any further medical issues. Please note that NO REFILLS for any discharge medications will be authorized once you are discharged, as it is imperative that you return to your primary care physician (or establish a relationship with a primary care physician if you do not have one) for your aftercare needs so that they can reassess your need for medications and monitor your lab values. If you do not have a primary care physician, you can call [phone] for a physician referral.   Increase activity slowly   Complete by: As directed  Allergies as of 06/17/2022       Reactions   Vancomycin Rash        Medication List     STOP taking these medications    losartan 25 MG tablet Commonly known as: COZAAR       TAKE these medications    albuterol 108 (90 Base) MCG/ACT inhaler Commonly known as: VENTOLIN HFA Inhale 1-2 puffs into the lungs every 4 (four) hours as needed for wheezing or shortness of breath.   apixaban 5 MG Tabs tablet Commonly known as:  ELIQUIS Take 1 tablet (5 mg total) by mouth every 12 (twelve) hours.   atorvastatin 80 MG tablet Commonly known as: LIPITOR Take 80 mg by mouth at bedtime.   doxazosin 8 MG tablet Commonly known as: CARDURA Take 8 mg by mouth at bedtime.   fluticasone 50 MCG/ACT nasal spray Commonly known as: FLONASE Place 2 sprays into the nose daily as needed (allergies).   furosemide 20 MG tablet Commonly known as: LASIX Take 2 tablets (40 mg total) by mouth daily for 7 days.   metoprolol succinate 25 MG 24 hr tablet Commonly known as: TOPROL-XL Take 25 mg by mouth daily.   nitroGLYCERIN 0.4 MG SL tablet Commonly known as: NITROSTAT PLACE 1 TABLET (0.4 MG TOTAL) UNDER THE TONGUE EVERY 5 (FIVE) MINUTES AS NEEDED FOR CHEST PAIN.   Ofev 150 MG Caps Generic drug: Nintedanib Take 1 capsule (150 mg total) by mouth 2 (two) times daily.   pantoprazole 40 MG tablet Commonly known as: PROTONIX TAKE 1 TABLET BY MOUTH DAILY   potassium chloride 10 MEQ tablet Commonly known as: KLOR-CON Take 1 tablet (10 mEq total) by mouth daily.   predniSONE 20 MG tablet Commonly known as: DELTASONE Take 3 tablets once daily for 5 days followed by 2 tablets once daily for 5 days followed by 1 tablet once daily for 5 days and then stop   ranolazine 500 MG 12 hr tablet Commonly known as: RANEXA Take 500 mg by mouth 2 (two) times daily.   STOOL SOFTENER LAXATIVE PO Take 2 tablets by mouth at bedtime.          Follow-up Information     Richardean Chimera, MD. Schedule an appointment as soon as possible for a visit in 1 week(s).   Specialty: Family Medicine Why: post hospitalization follow up Contact information: [address] [place] [address] [phone]                 TOTAL DISCHARGE TIME: 35 minutes  Zita Ozimek Rito Ehrlich  [facility] Pager on [email or web address]  06/18/2022, 10:48 AM

## 2022-06-19 ENCOUNTER — Encounter (HOSPITAL_COMMUNITY): Payer: Self-pay | Admitting: Cardiology

## 2022-06-19 ENCOUNTER — Ambulatory Visit: Payer: PPO | Admitting: Pulmonary Disease

## 2022-06-19 LAB — GLUCOSE, CAPILLARY: Glucose-Capillary: 92 mg/dL (ref 70–99)

## 2022-06-20 ENCOUNTER — Ambulatory Visit: Payer: PPO | Attending: Cardiology | Admitting: Cardiology

## 2022-06-20 ENCOUNTER — Encounter: Payer: Self-pay | Admitting: Cardiology

## 2022-06-20 VITALS — BP 172/92 | HR 70 | Ht 69.0 in | Wt 168.8 lb

## 2022-06-20 DIAGNOSIS — I4891 Unspecified atrial fibrillation: Secondary | ICD-10-CM | POA: Diagnosis not present

## 2022-06-20 DIAGNOSIS — I10 Essential (primary) hypertension: Secondary | ICD-10-CM | POA: Diagnosis not present

## 2022-06-20 DIAGNOSIS — I2510 Atherosclerotic heart disease of native coronary artery without angina pectoris: Secondary | ICD-10-CM | POA: Diagnosis not present

## 2022-06-20 DIAGNOSIS — I5032 Chronic diastolic (congestive) heart failure: Secondary | ICD-10-CM | POA: Diagnosis not present

## 2022-06-20 DIAGNOSIS — R079 Chest pain, unspecified: Secondary | ICD-10-CM | POA: Diagnosis not present

## 2022-06-20 DIAGNOSIS — Z79899 Other long term (current) drug therapy: Secondary | ICD-10-CM | POA: Diagnosis not present

## 2022-06-20 LAB — LIPOPROTEIN A (LPA): Lipoprotein (a): 29.5 nmol/L (ref <75.0)

## 2022-06-20 MED ORDER — AMLODIPINE BESYLATE 2.5 MG PO TABS: 2.5000 mg | ORAL_TABLET | Freq: Every day | ORAL | 6 refills | Status: DC

## 2022-06-20 MED ORDER — FUROSEMIDE 20 MG PO TABS: 20.0000 mg | ORAL_TABLET | Freq: Every day | ORAL | 3 refills | Status: DC

## 2022-06-20 NOTE — Progress Notes (Signed)
Clinical Summary Mr. Brotherson is a 82 y.o.male seen today for follow up of the following medical problems   1. CAD - hx of prior stenting 10 years ago  - cath 04/09/14 with patent coronaries.   06/2017 cath patent vessels. LVEDP 6.  - 05/2018 cath: patent vessels and stents, ostial D1 60-70% stenosis     -admit 06/2022 with chest pain concerning for unstable angina - 06/2022 cath: LM normal, mid LAD 20%, D1 60%, LCX normal, RCA moderate diffuse disease with catheter induced vasospasm - 06/2022 echo: LVEF 65-70%, no WMAs  - SOB started Tuesday morning. Tightness midchest, 7/10 in severity. Tightness lasted at least 3-4 hours, worst with sitting up. Better with IV in hospital. Recurrent episode Thursday morning. No specific palpitations. Some cough nonproductive, some wheezing. Seen in ER then  - on Thursday recurrent SOB, tightness in chest. Pain in back of neck, not positional. Felt like going to pass out, felt very weak. Labs suggested some dehydration.  - no recurrent symptoms.     2. IPF - followed by pulmonary     3. Atrial flutter/afib - admission 05/2018 with new onset aflutter. Symptoms were recurring chest tightness, neck discomfort, fatigue. DId not have significant palpitations.  - s/p TEE/DCCV that admission.    - sinus rhythm by 09/2021 EKG - during 06/2022 admit EKG afib, rate controlled       Past Medical History:  Diagnosis Date   Arthritis    "neck, hands, fingers" (04/09/2014)   Bleeds easily (HCC)    CAD (coronary artery disease)    a. s/p prior stenting of LAD b.  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 (HCC)    Pneumonia    Sleep apnea      Allergies  Allergen Reactions   Vancomycin  Rash     Current Outpatient Medications  Medication Sig Dispense Refill   albuterol (VENTOLIN HFA) 108 (90 Base) MCG/ACT inhaler Inhale 1-2 puffs into the lungs every 4 (four) hours as needed for wheezing or shortness of breath.     apixaban (ELIQUIS) 5 MG TABS tablet Take 1 tablet (5 mg total) by mouth every 12 (twelve) hours. 60 tablet 11   atorvastatin (LIPITOR) 80 MG tablet Take 80 mg by mouth at bedtime.      Docusate Sodium (STOOL SOFTENER LAXATIVE PO) Take 2 tablets by mouth at bedtime.     doxazosin (CARDURA) 8 MG tablet Take 8 mg by mouth at bedtime.      fluticasone (FLONASE) 50 MCG/ACT nasal spray Place 2 sprays into the nose daily as needed (allergies).     furosemide (LASIX) 20 MG tablet Take 2 tablets (40 mg total) by mouth daily for 7 days. 30 tablet 0   metoprolol succinate (TOPROL-XL) 25 MG 24 hr tablet Take 25 mg by mouth daily.      Nintedanib (OFEV) 150 MG CAPS Take 1 capsule (150 mg total) by mouth 2 (two) times daily. (Patient not taking: Reported on 01/20/2022) 60 capsule 5   nitroGLYCERIN (NITROSTAT) 0.4 MG SL tablet PLACE 1 TABLET (0.4 MG TOTAL) UNDER THE TONGUE EVERY 5 (FIVE) MINUTES AS NEEDED FOR CHEST PAIN. 25 tablet 3  pantoprazole (PROTONIX) 40 MG tablet TAKE 1 TABLET BY MOUTH DAILY (Patient taking differently: Take 40 mg by mouth daily.) 30 tablet 6   potassium chloride (KLOR-CON) 10 MEQ tablet Take 1 tablet (10 mEq total) by mouth daily. 90 tablet 3   predniSONE (DELTASONE) 20 MG tablet Take 3 tablets once daily for 5 days followed by 2 tablets once daily for 5 days followed by 1 tablet once daily for 5 days and then stop 30 tablet 0   ranolazine (RANEXA) 500 MG 12 hr tablet Take 500 mg by mouth 2 (two) times daily.      No current facility-administered medications for this visit.     Past Surgical History:  Procedure Laterality Date   ANKLE FRACTURE SURGERY Right 2009   CARDIAC CATHETERIZATION  1990's X 2;  04/09/2014   CARDIOVERSION N/A 06/07/2018    Procedure: CARDIOVERSION;  Surgeon: Dolores Patty, MD;  Location: [facility] ENDOSCOPY;  Service: Cardiovascular;  Laterality: N/A;   CATARACT EXTRACTION W/PHACO Left 01/16/2022   Procedure: CATARACT EXTRACTION PHACO AND INTRAOCULAR LENS PLACEMENT (IOC);  Surgeon: Fabio Pierce, MD;  Location: [facility]S;  Service: Ophthalmology;  Laterality: Left;  CDE 14.63   CATARACT EXTRACTION W/PHACO Right 02/03/2022   Procedure: CATARACT EXTRACTION PHACO AND INTRAOCULAR LENS PLACEMENT (IOC);  Surgeon: Fabio Pierce, MD;  Location: [facility];  Service: Ophthalmology;  Laterality: Right;  CDE 11.94   COLONOSCOPY N/A 01/22/2017   Dr. Darrick Penna: 6 tubular adenomas removed, external hemorrhoids.  Recommended next exam in 3 years.   COLONOSCOPY WITH PROPOFOL N/A 03/02/2020   Surgeon: Earnest Bailey K, DO; Nonbleeding internal hemorrhoids, multiple diverticula in the sigmoid, descending, transverse colon, 2 polyps 4 to 6 mm in size and one 2 mm polyp.  Pathology with tubular adenoma and hyperplastic polyp. Recommended repeat in 5 years.   CORONARY ANGIOPLASTY WITH STENT PLACEMENT  2006   "2"   CORONARY PRESSURE/FFR STUDY N/A 06/08/2017   Procedure: INTRAVASCULAR PRESSURE WIRE/FFR STUDY;  Surgeon: Yvonne Kendall, MD;  Location: [facility] INVASIVE CV LAB;  Service: Cardiovascular;  Laterality: N/A;   CYSTOSCOPY W/ STONE MANIPULATION  ~ 2008   ESOPHAGOGASTRODUODENOSCOPY N/A 01/22/2017   Dr. Darrick Penna: Mild to moderate gastritis.  No H. pylori   ESOPHAGOGASTRODUODENOSCOPY N/A 05/31/2018   Procedure: ESOPHAGOGASTRODUODENOSCOPY (EGD);  Surgeon: West Bali, MD;  normal examined esophagus, gastritis due to ASA, and normal examined duodenum.  No obvious source for melena.    FRACTURE SURGERY     LEFT HEART CATH AND CORONARY ANGIOGRAPHY N/A 06/08/2017   Procedure: LEFT HEART CATH AND CORONARY ANGIOGRAPHY;  Surgeon: Yvonne Kendall, MD;  Location: [facility] INVASIVE CV LAB;  Service: Cardiovascular;  Laterality: N/A;   LEFT HEART CATH AND CORONARY  ANGIOGRAPHY N/A 06/05/2018   Procedure: LEFT HEART CATH AND CORONARY ANGIOGRAPHY;  Surgeon: Lyn Records, MD;  Location: [facility] INVASIVE CV LAB;  Service: Cardiovascular;  Laterality: N/A;   LEFT HEART CATH AND CORONARY ANGIOGRAPHY N/A 06/16/2022   Procedure: LEFT HEART CATH AND CORONARY ANGIOGRAPHY;  Surgeon: Marykay Lex, MD;  Location: [facility] INVASIVE CV LAB;  Service: Cardiovascular;  Laterality: N/A;   LEFT HEART CATHETERIZATION WITH CORONARY ANGIOGRAM N/A 04/09/2014   Procedure: LEFT HEART CATHETERIZATION WITH CORONARY ANGIOGRAM;  Surgeon: Marykay Lex, MD;  Location: [facility] CATH LAB;  Service: Cardiovascular;  Laterality: N/A;   NASAL SEPTUM SURGERY  1999   POLYPECTOMY  01/22/2017   Procedure: POLYPECTOMY;  Surgeon: West Bali, MD;  Location: [facility] ENDO SUITE;  Service: Endoscopy;;  right  colon x4   POLYPECTOMY  03/02/2020   Procedure: POLYPECTOMY;  Surgeon: Lanelle Bal, DO;  Location: [facility] END[facility];  Service: Endoscopy;;   TEE WITHOUT CARDIOVERSION N/A 06/07/2018   Procedure: TRANSESOPHAGEAL ECHOCARDIOGRAM (TEE);  Surgeon: Dolores Patty, MD;  Location: [facility] ENDOSCOPY;  Service: Cardiovascular;  Laterality: N/A;     Allergies  Allergen Reactions   Vancomycin Rash      Family History  Problem Relation Age of Onset   Heart disease Mother    Colon cancer Neg Hx    Gastric cancer Neg Hx    Esophageal cancer Neg Hx      Social History Mr. Trosclair reports that he quit smoking about 58 years ago. His smoking use included cigarettes. He started smoking about 79 years ago. He has a 33.00 pack-year smoking history. He has never used smokeless tobacco. Mr. Helman reports no history of alcohol use.   Review of Systems CONSTITUTIONAL: No weight loss, fever, chills, weakness or fatigue.  HEENT: Eyes: No visual loss, blurred vision, double vision or yellow sclerae.No hearing loss, sneezing, congestion, runny nose or sore throat.  SKIN: No rash or itching.  CARDIOVASCULAR: per  hpi RESPIRATORY: per hpi GASTROINTESTINAL: No anorexia, nausea, vomiting or diarrhea. No abdominal pain or blood.  GENITOURINARY: No burning on urination, no polyuria NEUROLOGICAL: No headache, dizziness, syncope, paralysis, ataxia, numbness or tingling in the extremities. No change in bowel or bladder control.  MUSCULOSKELETAL: No muscle, back pain, joint pain or stiffness.  LYMPHATICS: No enlarged nodes. No history of splenectomy.  PSYCHIATRIC: No history of depression or anxiety.  ENDOCRINOLOGIC: No reports of sweating, cold or heat intolerance. No polyuria or polydipsia.  Marland Kitchen   Physical Examination Today's Vitals   06/20/22 0806  BP: (!) 172/92  Pulse: 70  SpO2: 100%  Weight: 168 lb 12.8 oz (76.6 kg)  Height: 5\' 9"  (1.753 m)   Body mass index is 24.93 kg/m.  Gen: resting comfortably, no acute distress HEENT: no scleral icterus, pupils equal round and reactive, no palptable cervical adenopathy,  CV: irreg, no m/r/g, no jvd Resp: dry crackles bilaterally GI: abdomen is soft, non-tender, non-distended, normal bowel sounds, no hepatosplenomegaly MSK: extremities are warm, no edema.  Skin: warm, no rash Neuro:  no focal deficits Psych: appropriate affect   Diagnostic Studies  04/09/14 Cath Hemodynamics:   Central Aortic Pressure / Mean: 126/67/92 mmHg Left Ventricular Pressure / LVEDP: 128/10/16 mmHg   Left Ventriculography: EF: 50-65 % Wall Motion: Normal to hyperdynamic   Coronary Anatomy: Dominance: Co-dominant Left Main: Large-caliber vessel that is short. Bifurcates into the LAD and Circumflex. Angiographically normal. LAD: Large-caliber vessel with a very large proximal D1 Henryetta Corriveau that comes off just prior to a widely patent stent in the early mid vessel. There is a small second diagonal Larah Kuntzman that arises from the stented segment and another small third diagonal Shyheim Tanney more distally. The distal vessel tapers into a moderate caliber vessel that wraps around the apex  perfusing the distal third of the inferoapex. D1: Large-caliber proximal Brookelle Pellicane with ostial 40-50% focal stenosis. The vessel then courses almost of the ramus intermedius. The vessel is very tortuous gives rise to several mold-moderate caliber branches distally. Beyond the ostial lesion, the vessel remains angiographically normal.   Left Circumflex: Moderate large-caliber, codominant vessel. There is a small moderate caliber first obtuse marginal Sincerity Cedar before the vessel courses into the AV groove. In the mid AV groove there is a hairpin turn as the vessel courses distally to provide  to posterior lateral branches with the first being moderate caliber and second mean small caliber. OM1: Small moderate caliber Rosalina Dingwall that does not cover large distribution. Tortuous but free of disease.    RCA: Moderate large-caliber, codominant vessel. Minimal luminal irregularities are vessel tapers down to [place] more moderate caliber right posterior descending arteries. There is a very small posterior lateral system.   After reviewing the initial angiography, no culprit lesion was identified.      10/2015 PFTs Mild ventilatory defect   11/2015 AAA screen [place] No aneurysm   04/2014 echo Study Conclusions  - Left ventricle: The cavity size was normal. Wall thickness was   normal. Systolic function was normal. The estimated ejection   fraction was in the range of 60% to 65%. Wall motion was normal;   there were no regional wall motion abnormalities. Left   ventricular diastolic function parameters were normal. - Mitral valve: There was mild regurgitation.   05/2017 nuclear stress No diagnostic ST segment changes to indicate ischemia. Small, mild intensity, apical inferior defect that is reversible and consistent with a small ischemic territory. Fixed inferior defect is more consistent with soft tissue attenuation. This is a low risk study. Nuclear stress EF: 76%.     06/2017 cath Stable appearance of the  coronary arteries since 2016, with 60% ostial D1 stenosis (FFR 0.90), patent mid LAD stent, and mild to moderate diffuse RCA disease. Normal to hyperdynamic left ventricular contraction with low filling pressure (LVEDP 6 mmHg).     05/2018 cath Left dominant coronary anatomy Left main is widely patent LAD including the mid vessel stent is widely patent.  The ostial first diagonal contains 60 to 70% stenosis, unchanged from 1 year ago. The circumflex territory is relatively small, contains irregularities in the mid body, and is unchanged from prior. Right coronary contains diffuse luminal irregularities before giving origin to the PDA.  No focal or significant obstruction is noted. Low normal LVEF, 50%.  LVEDP is normal.   RECOMMENDATIONS:   No change in coronary anatomy. Further management of atrial flutter.     04/2019 monitor 14 day event monitor Min HR 51, Max HR 128, Avg HR 68 Reported symptoms correlated with sinus rhythm and mild sinus tachycardia No significant arrhythmias   Assessment and Plan   1. CAD  -recent admit with chest pain and SOB, benign echo and cath - unclear etiology. Was diuresed, started on steroids for his lung disease - follow symptoms at this time, if recurrent may consider trial of DCCV. He had some similar sypmtoms in the past when out of rhythm - continue cardiac meds     2. Aflutter/afib - rate controlled afib during recent admission - unclear if playing any role in symptoms - nursing visit 3 weeks EKG and vitals, if still in afib and any ongoing symptoms may consider DCCV.Eliquis held during admit for cath  3. HTN - above goal - losartan stopped during recent admission due to elevated Cr, start norvasc 2.5mg  daily - nursing visit 3 weeks for bp check  4. HFpEF - lower lasix to 20mg  daily, may take additional 20mg  prn     Antoine Poche, M.D.

## 2022-06-20 NOTE — Patient Instructions (Addendum)
Medication Instructions:   Decrease Lasix to 20mg  daily, may take an additional 20mg  tab as needed for swelling Begin Norvasc 2.5mg  daily  Continue all other medications.     Labwork:  BMET, Mg - orders given Please do at the end of this week Office will contact with results via phone, letter or mychart.     Testing/Procedures:  none  Follow-Up:  3 months   Any Other Special Instructions Will Be Listed Below (If Applicable).  Nurse visit in 3 weeks for EKG and vitals.   If you need a refill on your cardiac medications before your next appointment, please call your pharmacy.

## 2022-06-21 ENCOUNTER — Encounter: Payer: Self-pay | Admitting: Pulmonary Disease

## 2022-06-21 ENCOUNTER — Ambulatory Visit (INDEPENDENT_AMBULATORY_CARE_PROVIDER_SITE_OTHER): Payer: PPO | Admitting: Pulmonary Disease

## 2022-06-21 VITALS — BP 138/74 | HR 86 | Ht 69.0 in | Wt 168.8 lb

## 2022-06-21 DIAGNOSIS — J84112 Idiopathic pulmonary fibrosis: Secondary | ICD-10-CM

## 2022-06-21 NOTE — Patient Instructions (Signed)
X HR CT chest  X PFTs  X Ambulatory sat

## 2022-06-21 NOTE — Assessment & Plan Note (Signed)
It is possible that he has had progression of IPF.  Will obtain high-resolution CT chest and PFTs to clarify. We again discussed the natural history of IPF.  I tried to explore his resistance to starting in the left antifibrotic.  He had side effects with ofev and does not want to try again. We discussed side effect profile of pirfenidone and he would be willing to try based on results of above LFTs were normal when checked in the hospital

## 2022-06-21 NOTE — Progress Notes (Signed)
   Subjective:    Patient ID: Eric Benitez, male    DOB: 07/22/1940, 82 y.o.   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    PMH - CAD s/p stent, LHC 06/2022 patent stent atrial flutter status post TEE DCCV 05/2018 on Eliquis Echo 04/2021 shows normal LVEF and mild AI   Environment -they live in his [address] single-family home.  There was mold under the floorboards which was cleared in 2022. 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  Lives with his wife Eric Benitez in his 37s less than 10 pack years  Chief Complaint  Patient presents with   Follow-up    Pt f/u states that his breathing has worsened - no longer taking Ofev. Was admitted last week and give lasix and prednisone   64-month follow-up visit. He was hospitalized 06/2022 for chest pain and dyspnea and found to have atrial fibrillation.  He underwent left heart cath which showed patent stent.  I have reviewed hospital course and discharge summary He complains of gradually worsening dyspnea on exertion.  He was given prednisone on discharge from the hospital.  He took 60 mg for 5 days and now has decreased to 40 mg.  He feels this is helping Accompanied by wife and daughter who corroborate history    Significant tests/ events reviewed   HRCT 06/2021 UIP pattern 06/2021 ANA, CCP neg   07/2021 PFTs ratio 77, FVC 68%, TLC 66%, DLCO 14.40/60%   10/2015 PFTs moderate intraparenchymal restriction, ratio 89, FEV1 94%, FVC 76%, TLC 80%, DLCO 17.1/55%, corrects for alveolar volume   CT abdomen/pelvis 06/26/2018 and 11/26/2019 shows peripheral fibrosis at the lung bases   Chest x-ray 05/2017 shows bilateral interstitial  chronic infiltrates  Review of Systems neg for any significant sore throat, dysphagia, itching, sneezing, nasal congestion or excess/ purulent secretions, fever, chills, sweats, unintended wt loss, pleuritic or exertional cp, hempoptysis, orthopnea pnd or change in chronic leg swelling. Also denies presyncope, palpitations, heartburn, abdominal pain, nausea, vomiting, diarrhea or change in bowel or urinary habits, dysuria,hematuria, rash, arthralgias, visual complaints, headache, numbness weakness or ataxia.     Objective:   Physical Exam  Gen. Pleasant, well-nourished, in no distress ENT - no thrush, no pallor/icterus,no post nasal drip Neck: No JVD, no thyromegaly, no carotid bruits Lungs: no use of accessory muscles, no dullness to percussion, bibasal crackles Cardiovascular: Rhythm regular, heart sounds  normal, no murmurs or gallops, no peripheral edema Musculoskeletal: No deformities, no cyanosis or clubbing        Assessment & Plan:

## 2022-06-23 DIAGNOSIS — I5032 Chronic diastolic (congestive) heart failure: Secondary | ICD-10-CM | POA: Diagnosis not present

## 2022-06-23 DIAGNOSIS — I10 Essential (primary) hypertension: Secondary | ICD-10-CM | POA: Diagnosis not present

## 2022-06-23 DIAGNOSIS — Z79899 Other long term (current) drug therapy: Secondary | ICD-10-CM | POA: Diagnosis not present

## 2022-06-24 LAB — BASIC METABOLIC PANEL
BUN/Creatinine Ratio: 19 (ref 10–24)
BUN: 24 mg/dL (ref 8–27)
CO2: 22 mmol/L (ref 20–29)
Calcium: 9.2 mg/dL (ref 8.6–10.2)
Chloride: 96 mmol/L (ref 96–106)
Creatinine, Ser: 1.25 mg/dL (ref 0.76–1.27)
Glucose: 155 mg/dL — ABNORMAL HIGH (ref 70–99)
Potassium: 4.4 mmol/L (ref 3.5–5.2)
Sodium: 136 mmol/L (ref 134–144)
eGFR: 58 mL/min/{1.73_m2} — ABNORMAL LOW (ref >59)

## 2022-06-24 LAB — MAGNESIUM: Magnesium: 2 mg/dL (ref 1.6–2.3)

## 2022-06-28 DIAGNOSIS — H16223 Keratoconjunctivitis sicca, not specified as Sjogren's, bilateral: Secondary | ICD-10-CM | POA: Diagnosis not present

## 2022-07-05 ENCOUNTER — Ambulatory Visit (HOSPITAL_COMMUNITY)
Admission: RE | Admit: 2022-07-05 | Discharge: 2022-07-05 | Disposition: A | Discharge status: Home / Self Care | Payer: PPO | Source: Ambulatory Visit | Attending: Pulmonary Disease | Admitting: Pulmonary Disease

## 2022-07-05 DIAGNOSIS — J84112 Idiopathic pulmonary fibrosis: Secondary | ICD-10-CM | POA: Diagnosis not present

## 2022-07-05 DIAGNOSIS — J479 Bronchiectasis, uncomplicated: Secondary | ICD-10-CM | POA: Diagnosis not present

## 2022-07-07 ENCOUNTER — Encounter: Payer: Self-pay | Admitting: Cardiology

## 2022-07-07 DIAGNOSIS — I10 Essential (primary) hypertension: Secondary | ICD-10-CM | POA: Diagnosis not present

## 2022-07-07 DIAGNOSIS — I25111 Atherosclerotic heart disease of native coronary artery with angina pectoris with documented spasm: Secondary | ICD-10-CM | POA: Diagnosis not present

## 2022-07-10 ENCOUNTER — Encounter: Payer: Self-pay | Admitting: *Deleted

## 2022-07-11 ENCOUNTER — Ambulatory Visit: Payer: PPO | Attending: Cardiology | Admitting: *Deleted

## 2022-07-11 VITALS — BP 136/80 | HR 72 | Ht 69.0 in | Wt 166.0 lb

## 2022-07-11 DIAGNOSIS — I4892 Unspecified atrial flutter: Secondary | ICD-10-CM | POA: Diagnosis not present

## 2022-07-11 NOTE — Progress Notes (Signed)
EKG shows heart is back in normal rhythm today, continue current meds  JBranch MD

## 2022-07-11 NOTE — Progress Notes (Signed)
Patient informed and verbalized understanding of plan. 

## 2022-07-11 NOTE — Patient Instructions (Signed)
Your physician recommends that you continue on your current medications as directed. Please refer to the Current Medication list given to you today.  We will contact you with any changes your provider may have.

## 2022-07-11 NOTE — Progress Notes (Signed)
Presents to office for nurse visit per last office visit request: nursing visit 3 weeks EKG and vitals, if still in afib and any ongoing symptoms may consider DCCV Denies dizziness or chest pain. Reports SOB that he has all the time and is unchanged. Medications reviewed. Reports taking all medications as prescribed without missing doses. EKG & vitals done and routed to provider for review.

## 2022-07-17 DIAGNOSIS — I25119 Atherosclerotic heart disease of native coronary artery with unspecified angina pectoris: Secondary | ICD-10-CM | POA: Diagnosis not present

## 2022-07-18 ENCOUNTER — Telehealth: Payer: Self-pay

## 2022-07-18 ENCOUNTER — Encounter (HOSPITAL_BASED_OUTPATIENT_CLINIC_OR_DEPARTMENT_OTHER): Payer: Self-pay

## 2022-07-18 NOTE — Telephone Encounter (Signed)
Per Dr. Vassie Loll  recs: Patient to begin Esbriet . Patient assistance paperwork signed.  Will begin titration per esbriet protocol.

## 2022-07-18 NOTE — Telephone Encounter (Signed)
Dr.Alva please be advised I provided pt asst form to TP, she has signed the form for pt to begin pirfenidone.

## 2022-07-19 ENCOUNTER — Telehealth: Payer: Self-pay | Admitting: Cardiology

## 2022-07-19 NOTE — Telephone Encounter (Signed)
   Pre-operative Risk Assessment    Patient Name: JOSEY FORCIER  DOB: 01-14-1941 MRN: [id number]     Request for Surgical Clearance    Procedure:   extract 1 tooth   Date of Surgery:  Clearance TBD                                 Surgeon:  Warden Fillers  Surgeon's Group or Practice Name:  [facility] Phone number:  [phone] Fax number:  [phone]  4. What type of clearance is requested?  Medical or Cardiac Clearance only?  Pharmacy Clearance Only (Request is to hold medication only)?  Or Both?  Press F2 and select the clearance requested.  If both are needed, select both from the drop down list.     :1}  Type of Clearance Requested:   - Pharmacy:  Hold Apixaban (Eliquis) ?   Type of Anesthesia:  Local    Additional requests/questions:    Lajuana Matte   07/19/2022, 5:21 PM

## 2022-07-20 ENCOUNTER — Telehealth: Payer: Self-pay | Admitting: Pharmacist

## 2022-07-20 NOTE — Progress Notes (Signed)
Will start pirfenidone/Esbriet BIV while we await return of Esbriet forms  Chesley Mires, PharmD, MPH, BCPS, CPP Clinical Pharmacist (Rheumatology and Pulmonology)

## 2022-07-20 NOTE — Telephone Encounter (Addendum)
Please start Esbriet/pirfenidone BIV.  Dose using 267mg  tabs: Take 1 tab three times daily for 7 days, then 2 tabs three times daily for 7 days, then 3 tabs three times daily thereafter.  Forms being completed by Jeanice Lim, CMA with patient  ----- Message from Oretha Milch, MD sent at 07/17/2022  2:23 PM EDT ----- Okay to start paperwork for pirfenidone and send to pharmacy Please have APP or one of the other providers sign for me because it may be a few weeks before I get back to office

## 2022-07-20 NOTE — Telephone Encounter (Signed)
   Primary Cardiologist: Dina Rich, MD  Chart reviewed as part of pre-operative protocol coverage. Simple dental extractions (1-2 teeth) are considered low risk procedures per guidelines and generally do not require any specific cardiac clearance. It is also generally accepted that for simple extractions and dental cleanings, there is no need to interrupt blood thinner therapy.   SBE prophylaxis is not required for the patient.  I will route this recommendation to the requesting party via Epic fax function and remove from pre-op pool.  Please call with questions.  Levi Aland, NP-C  07/20/2022, 9:04 AM [address] Office [phone] Fax [phone]

## 2022-07-21 NOTE — Telephone Encounter (Signed)
Submitted a Prior Authorization request to [employer]/ [employer] Advance for ESBRIET via CoverMyMeds. Will update once we receive a response.  Key: [id number]

## 2022-07-25 ENCOUNTER — Telehealth: Payer: Self-pay | Admitting: Cardiology

## 2022-07-25 NOTE — Telephone Encounter (Signed)
   Pre-operative Risk Assessment    Patient Name: Eric Benitez  DOB: Mar 26, 1940 MRN: [id number]     Request for Surgical Clearance    Procedure:  Dental Extraction - Amount of Teeth to be Pulled:  1 tooth (number 21)  Date of Surgery:  Clearance 07/25/22                                 Surgeon:  Warden Fillers Surgeon's Group or Practice Name:  [facility] Phone number:  [phone] Fax number:  [phone]   Type of Clearance Requested:   - Pharmacy:  Hold Apixaban (Eliquis)     Type of Anesthesia:  Local    Additional requests/questions:   Pt is currently in the chair, office would like to know if it's okay to move forward with procedure since pt is on Eliquis.   Barbette Reichmann   07/25/2022, 8:55 AM

## 2022-07-25 NOTE — Telephone Encounter (Signed)
   Patient Name: Eric Benitez  DOB: [birth year] MRN: [id number]  Primary Cardiologist: Dina Rich, MD  Chart reviewed as part of pre-operative protocol coverage.   Simple dental extractions (i.e. 1-2 teeth) are considered low risk procedures per guidelines and generally do not require any specific cardiac clearance. It is also generally accepted that for simple extractions and dental cleanings, there is no need to interrupt blood thinner therapy.   SBE prophylaxis is not required for the patient from a cardiac standpoint.  I will route this recommendation to the requesting party via Epic fax function and remove from pre-op pool.  Please call with questions.  Joylene Grapes, NP 07/25/2022, 9:06 AM

## 2022-07-27 ENCOUNTER — Other Ambulatory Visit (HOSPITAL_COMMUNITY): Payer: Self-pay

## 2022-07-27 NOTE — Telephone Encounter (Signed)
Received notification from  [employer]  regarding a prior authorization for ESBRIET. Authorization has been APPROVED from 07/21/22 to 02/06/23. Approval letter sent to scan center.  Per test claim, copay for 30 days supply is $2066.01 (with DAW 2)  Patient can fill through [facility]: [phone]   Authorization # [id number] Phone # [phone]  PA approval letter received and sent to media tab while we await PAP forms  Chesley Mires, PharmD, MPH, BCPS, CPP Clinical Pharmacist (Rheumatology and Pulmonology)

## 2022-07-28 ENCOUNTER — Encounter (HOSPITAL_BASED_OUTPATIENT_CLINIC_OR_DEPARTMENT_OTHER): Payer: PPO

## 2022-08-09 DIAGNOSIS — R030 Elevated blood-pressure reading, without diagnosis of hypertension: Secondary | ICD-10-CM | POA: Diagnosis not present

## 2022-08-09 DIAGNOSIS — K047 Periapical abscess without sinus: Secondary | ICD-10-CM | POA: Diagnosis not present

## 2022-08-09 DIAGNOSIS — K1379 Other lesions of oral mucosa: Secondary | ICD-10-CM | POA: Diagnosis not present

## 2022-08-09 DIAGNOSIS — Z6825 Body mass index (BMI) 25.0-25.9, adult: Secondary | ICD-10-CM | POA: Diagnosis not present

## 2022-08-21 ENCOUNTER — Other Ambulatory Visit (HOSPITAL_COMMUNITY): Payer: Self-pay

## 2022-08-21 NOTE — Telephone Encounter (Signed)
PAP application received on 08/16/22, all incomplete sections have been addressed and form has been faxed to [place]. Will await response.

## 2022-08-24 DIAGNOSIS — E7849 Other hyperlipidemia: Secondary | ICD-10-CM | POA: Diagnosis not present

## 2022-08-24 DIAGNOSIS — E559 Vitamin D deficiency, unspecified: Secondary | ICD-10-CM | POA: Diagnosis not present

## 2022-08-24 DIAGNOSIS — I10 Essential (primary) hypertension: Secondary | ICD-10-CM | POA: Diagnosis not present

## 2022-08-24 DIAGNOSIS — Z1329 Encounter for screening for other suspected endocrine disorder: Secondary | ICD-10-CM | POA: Diagnosis not present

## 2022-08-24 DIAGNOSIS — Z0001 Encounter for general adult medical examination with abnormal findings: Secondary | ICD-10-CM | POA: Diagnosis not present

## 2022-08-28 ENCOUNTER — Ambulatory Visit (INDEPENDENT_AMBULATORY_CARE_PROVIDER_SITE_OTHER): Payer: PPO | Admitting: Pulmonary Disease

## 2022-08-28 DIAGNOSIS — J84112 Idiopathic pulmonary fibrosis: Secondary | ICD-10-CM | POA: Diagnosis not present

## 2022-08-28 NOTE — Patient Instructions (Signed)
Full PFT performed today. °

## 2022-08-28 NOTE — Progress Notes (Signed)
Full PFT performed today. °

## 2022-08-30 LAB — PULMONARY FUNCTION TEST
DL/VA % pred: 72 %
DL/VA: 2.83 ml/min/mmHg/L
DLCO cor % pred: 45 %
DLCO unc % pred: 45 %
DLCO unc: 10.7 ml/min/mmHg
FEF 25-75 Post: 2.9 L/sec
FEF 25-75 Pre: 1.84 L/sec
FEF2575-%Change-Post: 57 %
FEF2575-%Pred-Post: 158 %
FEF2575-%Pred-Pre: 100 %
FEV1-%Change-Post: 9 %
FEV1-%Pred-Post: 77 %
FEV1-%Pred-Pre: 71 %
FEV1-Post: 2.1 L
FEV1-Pre: 1.92 L
FEV1FVC-%Change-Post: 7 %
FEV1FVC-%Pred-Pre: 113 %
FEV6-%Change-Post: -1 %
FEV6-%Pred-Post: 65 %
FEV6-%Pred-Pre: 66 %
FEV6-Post: 2.34 L
FEV6-Pre: 2.37 L
FEV6FVC-%Pred-Pre: 107 %
FVC-%Change-Post: 1 %
FVC-%Pred-Post: 63 %
FVC-%Pred-Pre: 62 %
FVC-Post: 2.42 L
Post FEV1/FVC ratio: 87 %
Post FEV6/FVC ratio: 100 %
Pre FEV1/FVC ratio: 81 %
Pre FEV6/FVC Ratio: 100 %
RV % pred: 78 %
RV: 2.06 L
TLC % pred: 65 %
TLC: 4.48 L

## 2022-08-31 DIAGNOSIS — Z23 Encounter for immunization: Secondary | ICD-10-CM | POA: Diagnosis not present

## 2022-08-31 DIAGNOSIS — K2100 Gastro-esophageal reflux disease with esophagitis, without bleeding: Secondary | ICD-10-CM | POA: Diagnosis not present

## 2022-08-31 DIAGNOSIS — D6869 Other thrombophilia: Secondary | ICD-10-CM | POA: Diagnosis not present

## 2022-08-31 DIAGNOSIS — D649 Anemia, unspecified: Secondary | ICD-10-CM | POA: Diagnosis not present

## 2022-08-31 DIAGNOSIS — E7849 Other hyperlipidemia: Secondary | ICD-10-CM | POA: Diagnosis not present

## 2022-08-31 DIAGNOSIS — I25119 Atherosclerotic heart disease of native coronary artery with unspecified angina pectoris: Secondary | ICD-10-CM | POA: Diagnosis not present

## 2022-08-31 DIAGNOSIS — I10 Essential (primary) hypertension: Secondary | ICD-10-CM | POA: Diagnosis not present

## 2022-08-31 DIAGNOSIS — Z0001 Encounter for general adult medical examination with abnormal findings: Secondary | ICD-10-CM | POA: Diagnosis not present

## 2022-08-31 DIAGNOSIS — D692 Other nonthrombocytopenic purpura: Secondary | ICD-10-CM | POA: Diagnosis not present

## 2022-08-31 DIAGNOSIS — N401 Enlarged prostate with lower urinary tract symptoms: Secondary | ICD-10-CM | POA: Diagnosis not present

## 2022-08-31 DIAGNOSIS — I482 Chronic atrial fibrillation, unspecified: Secondary | ICD-10-CM | POA: Diagnosis not present

## 2022-09-06 DIAGNOSIS — I25110 Atherosclerotic heart disease of native coronary artery with unstable angina pectoris: Secondary | ICD-10-CM | POA: Diagnosis not present

## 2022-09-06 DIAGNOSIS — I10 Essential (primary) hypertension: Secondary | ICD-10-CM | POA: Diagnosis not present

## 2022-09-11 NOTE — Telephone Encounter (Signed)
Received a fax from [place] regarding an approval for Esbriet patient assistance from 09/07/2022 until patient no longer qualifies due to discontinuation of therapy, changes to patient's current financial status or health insurance and/or patient no longer meets the program eligibility requirements. Called pt and provided update and next steps, phone numbers have been provided. Encouraged pt to reach out if she has any additional questions or concerns. Pt verbalized understanding to all. Relevant Documents have been sent to scan center.  [employer] Phone#: [phone] option 5 [employer] Phone#: [phone]

## 2022-09-15 ENCOUNTER — Telehealth: Payer: Self-pay | Admitting: *Deleted

## 2022-09-15 NOTE — Telephone Encounter (Signed)
Returned call to pt's daughter and provided medication strength as well as dosage information. Also provided my direct call back number should she have any additional questions going forward. Nothing further needed at this time.

## 2022-09-15 NOTE — Telephone Encounter (Signed)
Patient's Daughter Toniann Fail, [place] per [facility]) called to request dosage amounts for Esbriet.   Marylene Land states that they finally received financial assistance for medications but needed clarification for dosages.   There was an encounter from [facility], last message from [place] on 09/11/22 regarding this medication if someone could just call to clarify dosage for patient to receive medications.  Please call Daughter Toniann Fail [phone]

## 2022-09-21 ENCOUNTER — Ambulatory Visit: Payer: PPO | Admitting: Nurse Practitioner

## 2022-09-21 ENCOUNTER — Ambulatory Visit: Payer: PPO | Admitting: Pulmonary Disease

## 2022-09-22 ENCOUNTER — Ambulatory Visit: Payer: PPO | Admitting: Nurse Practitioner

## 2022-09-22 ENCOUNTER — Ambulatory Visit: Payer: PPO | Admitting: Pulmonary Disease

## 2022-09-24 DIAGNOSIS — J1282 Pneumonia due to coronavirus disease 2019: Secondary | ICD-10-CM | POA: Diagnosis not present

## 2022-09-24 DIAGNOSIS — J849 Interstitial pulmonary disease, unspecified: Secondary | ICD-10-CM | POA: Diagnosis not present

## 2022-09-24 DIAGNOSIS — Z955 Presence of coronary angioplasty implant and graft: Secondary | ICD-10-CM | POA: Diagnosis not present

## 2022-09-24 DIAGNOSIS — Z881 Allergy status to other antibiotic agents status: Secondary | ICD-10-CM | POA: Diagnosis not present

## 2022-09-24 DIAGNOSIS — A4189 Other specified sepsis: Secondary | ICD-10-CM | POA: Diagnosis not present

## 2022-09-24 DIAGNOSIS — I2510 Atherosclerotic heart disease of native coronary artery without angina pectoris: Secondary | ICD-10-CM | POA: Diagnosis not present

## 2022-09-24 DIAGNOSIS — I4891 Unspecified atrial fibrillation: Secondary | ICD-10-CM | POA: Diagnosis not present

## 2022-09-24 DIAGNOSIS — R109 Unspecified abdominal pain: Secondary | ICD-10-CM | POA: Diagnosis not present

## 2022-09-24 DIAGNOSIS — I482 Chronic atrial fibrillation, unspecified: Secondary | ICD-10-CM | POA: Diagnosis not present

## 2022-09-24 DIAGNOSIS — Z87891 Personal history of nicotine dependence: Secondary | ICD-10-CM | POA: Diagnosis not present

## 2022-09-24 DIAGNOSIS — K8020 Calculus of gallbladder without cholecystitis without obstruction: Secondary | ICD-10-CM | POA: Diagnosis not present

## 2022-09-24 DIAGNOSIS — Z79899 Other long term (current) drug therapy: Secondary | ICD-10-CM | POA: Diagnosis not present

## 2022-09-24 DIAGNOSIS — D638 Anemia in other chronic diseases classified elsewhere: Secondary | ICD-10-CM | POA: Diagnosis not present

## 2022-09-24 DIAGNOSIS — K7689 Other specified diseases of liver: Secondary | ICD-10-CM | POA: Diagnosis not present

## 2022-09-24 DIAGNOSIS — N400 Enlarged prostate without lower urinary tract symptoms: Secondary | ICD-10-CM | POA: Diagnosis not present

## 2022-09-24 DIAGNOSIS — R102 Pelvic and perineal pain: Secondary | ICD-10-CM | POA: Diagnosis not present

## 2022-09-24 DIAGNOSIS — R059 Cough, unspecified: Secondary | ICD-10-CM | POA: Diagnosis not present

## 2022-09-24 DIAGNOSIS — N200 Calculus of kidney: Secondary | ICD-10-CM | POA: Diagnosis not present

## 2022-09-24 DIAGNOSIS — U071 COVID-19: Secondary | ICD-10-CM | POA: Diagnosis not present

## 2022-09-24 DIAGNOSIS — R0902 Hypoxemia: Secondary | ICD-10-CM | POA: Diagnosis not present

## 2022-09-24 DIAGNOSIS — J440 Chronic obstructive pulmonary disease with acute lower respiratory infection: Secondary | ICD-10-CM | POA: Diagnosis not present

## 2022-09-24 DIAGNOSIS — K219 Gastro-esophageal reflux disease without esophagitis: Secondary | ICD-10-CM | POA: Diagnosis not present

## 2022-09-24 DIAGNOSIS — R1011 Right upper quadrant pain: Secondary | ICD-10-CM | POA: Diagnosis not present

## 2022-09-24 DIAGNOSIS — K920 Hematemesis: Secondary | ICD-10-CM | POA: Diagnosis not present

## 2022-09-24 DIAGNOSIS — R6520 Severe sepsis without septic shock: Secondary | ICD-10-CM | POA: Diagnosis not present

## 2022-09-24 DIAGNOSIS — R54 Age-related physical debility: Secondary | ICD-10-CM | POA: Diagnosis not present

## 2022-09-24 DIAGNOSIS — J9601 Acute respiratory failure with hypoxia: Secondary | ICD-10-CM | POA: Diagnosis not present

## 2022-09-24 DIAGNOSIS — E782 Mixed hyperlipidemia: Secondary | ICD-10-CM | POA: Diagnosis not present

## 2022-09-24 DIAGNOSIS — I509 Heart failure, unspecified: Secondary | ICD-10-CM | POA: Diagnosis not present

## 2022-09-24 DIAGNOSIS — J984 Other disorders of lung: Secondary | ICD-10-CM | POA: Diagnosis not present

## 2022-09-24 DIAGNOSIS — I110 Hypertensive heart disease with heart failure: Secondary | ICD-10-CM | POA: Diagnosis not present

## 2022-09-24 DIAGNOSIS — I5033 Acute on chronic diastolic (congestive) heart failure: Secondary | ICD-10-CM | POA: Diagnosis not present

## 2022-09-24 DIAGNOSIS — R918 Other nonspecific abnormal finding of lung field: Secondary | ICD-10-CM | POA: Diagnosis not present

## 2022-09-27 ENCOUNTER — Telehealth: Payer: Self-pay | Admitting: *Deleted

## 2022-09-27 NOTE — Telephone Encounter (Signed)
Patient's daughter Toniann Fail ([place] per [facility]) states that patient had COVID last week and had to cancel appointment with Dr. Vassie Loll.   Patient now has pneumonia and is currently admitted into [facility] (admitted on Sunday 09/24/22)  and would like for Dr. Vassie Loll to know. Patient's daughter would like to see if Dr. Vassie Loll recommends changing hospitals.   She states that he has also been coughing up nearly "black" blood since Saturday, has also had GI issues regarding his gallbladder.    Patient has hospital follow up (next available) with Dr. Sherene Sires on 10/16/22 at 1015 and has been added to Dr. Reginia Naas Wait list for any cancellations  (If OK to double book from Dr. Vassie Loll, I can schedule HFU for 9/3-9/6)  Please call and advise Patient's daughter Toniann Fail [phone]

## 2022-09-29 ENCOUNTER — Ambulatory Visit: Payer: PPO | Admitting: Gastroenterology

## 2022-09-30 ENCOUNTER — Encounter (HOSPITAL_COMMUNITY): Payer: Self-pay

## 2022-09-30 ENCOUNTER — Inpatient Hospital Stay (HOSPITAL_COMMUNITY)
Admission: RE | Admit: 2022-09-30 | Discharge: 2022-10-08 | DRG: 177 | Disposition: E | Payer: PPO | Source: Other Acute Inpatient Hospital | Attending: Family Medicine | Admitting: Family Medicine

## 2022-09-30 ENCOUNTER — Inpatient Hospital Stay (HOSPITAL_COMMUNITY): Payer: PPO

## 2022-09-30 DIAGNOSIS — J1282 Pneumonia due to coronavirus disease 2019: Secondary | ICD-10-CM | POA: Diagnosis present

## 2022-09-30 DIAGNOSIS — I21A1 Myocardial infarction type 2: Secondary | ICD-10-CM | POA: Diagnosis not present

## 2022-09-30 DIAGNOSIS — E871 Hypo-osmolality and hyponatremia: Secondary | ICD-10-CM | POA: Diagnosis not present

## 2022-09-30 DIAGNOSIS — I482 Chronic atrial fibrillation, unspecified: Secondary | ICD-10-CM

## 2022-09-30 DIAGNOSIS — I2510 Atherosclerotic heart disease of native coronary artery without angina pectoris: Secondary | ICD-10-CM | POA: Diagnosis not present

## 2022-09-30 DIAGNOSIS — R042 Hemoptysis: Secondary | ICD-10-CM | POA: Insufficient documentation

## 2022-09-30 DIAGNOSIS — K219 Gastro-esophageal reflux disease without esophagitis: Secondary | ICD-10-CM | POA: Diagnosis not present

## 2022-09-30 DIAGNOSIS — R7989 Other specified abnormal findings of blood chemistry: Secondary | ICD-10-CM

## 2022-09-30 DIAGNOSIS — Z8249 Family history of ischemic heart disease and other diseases of the circulatory system: Secondary | ICD-10-CM

## 2022-09-30 DIAGNOSIS — Z87891 Personal history of nicotine dependence: Secondary | ICD-10-CM

## 2022-09-30 DIAGNOSIS — N179 Acute kidney failure, unspecified: Secondary | ICD-10-CM | POA: Insufficient documentation

## 2022-09-30 DIAGNOSIS — D649 Anemia, unspecified: Secondary | ICD-10-CM | POA: Diagnosis present

## 2022-09-30 DIAGNOSIS — Z881 Allergy status to other antibiotic agents status: Secondary | ICD-10-CM

## 2022-09-30 DIAGNOSIS — E785 Hyperlipidemia, unspecified: Secondary | ICD-10-CM | POA: Diagnosis present

## 2022-09-30 DIAGNOSIS — J9691 Respiratory failure, unspecified with hypoxia: Secondary | ICD-10-CM | POA: Diagnosis not present

## 2022-09-30 DIAGNOSIS — J849 Interstitial pulmonary disease, unspecified: Secondary | ICD-10-CM | POA: Diagnosis not present

## 2022-09-30 DIAGNOSIS — I4891 Unspecified atrial fibrillation: Secondary | ICD-10-CM | POA: Diagnosis not present

## 2022-09-30 DIAGNOSIS — M47812 Spondylosis without myelopathy or radiculopathy, cervical region: Secondary | ICD-10-CM | POA: Diagnosis present

## 2022-09-30 DIAGNOSIS — I5033 Acute on chronic diastolic (congestive) heart failure: Secondary | ICD-10-CM | POA: Insufficient documentation

## 2022-09-30 DIAGNOSIS — F419 Anxiety disorder, unspecified: Secondary | ICD-10-CM | POA: Diagnosis not present

## 2022-09-30 DIAGNOSIS — I130 Hypertensive heart and chronic kidney disease with heart failure and stage 1 through stage 4 chronic kidney disease, or unspecified chronic kidney disease: Secondary | ICD-10-CM | POA: Diagnosis not present

## 2022-09-30 DIAGNOSIS — Z7901 Long term (current) use of anticoagulants: Secondary | ICD-10-CM

## 2022-09-30 DIAGNOSIS — J9621 Acute and chronic respiratory failure with hypoxia: Secondary | ICD-10-CM | POA: Diagnosis not present

## 2022-09-30 DIAGNOSIS — J440 Chronic obstructive pulmonary disease with acute lower respiratory infection: Secondary | ICD-10-CM | POA: Diagnosis not present

## 2022-09-30 DIAGNOSIS — Z515 Encounter for palliative care: Secondary | ICD-10-CM

## 2022-09-30 DIAGNOSIS — I4821 Permanent atrial fibrillation: Secondary | ICD-10-CM | POA: Diagnosis present

## 2022-09-30 DIAGNOSIS — N1831 Chronic kidney disease, stage 3a: Secondary | ICD-10-CM | POA: Diagnosis not present

## 2022-09-30 DIAGNOSIS — J84112 Idiopathic pulmonary fibrosis: Secondary | ICD-10-CM | POA: Diagnosis present

## 2022-09-30 DIAGNOSIS — N400 Enlarged prostate without lower urinary tract symptoms: Secondary | ICD-10-CM | POA: Diagnosis not present

## 2022-09-30 DIAGNOSIS — G4730 Sleep apnea, unspecified: Secondary | ICD-10-CM | POA: Diagnosis present

## 2022-09-30 DIAGNOSIS — R54 Age-related physical debility: Secondary | ICD-10-CM | POA: Diagnosis present

## 2022-09-30 DIAGNOSIS — R918 Other nonspecific abnormal finding of lung field: Secondary | ICD-10-CM | POA: Diagnosis not present

## 2022-09-30 DIAGNOSIS — M19041 Primary osteoarthritis, right hand: Secondary | ICD-10-CM | POA: Diagnosis present

## 2022-09-30 DIAGNOSIS — Z66 Do not resuscitate: Secondary | ICD-10-CM | POA: Diagnosis not present

## 2022-09-30 DIAGNOSIS — Z955 Presence of coronary angioplasty implant and graft: Secondary | ICD-10-CM | POA: Diagnosis not present

## 2022-09-30 DIAGNOSIS — U071 COVID-19: Principal | ICD-10-CM | POA: Diagnosis present

## 2022-09-30 DIAGNOSIS — I517 Cardiomegaly: Secondary | ICD-10-CM | POA: Diagnosis not present

## 2022-09-30 DIAGNOSIS — I10 Essential (primary) hypertension: Secondary | ICD-10-CM | POA: Diagnosis present

## 2022-09-30 DIAGNOSIS — M19042 Primary osteoarthritis, left hand: Secondary | ICD-10-CM | POA: Diagnosis not present

## 2022-09-30 DIAGNOSIS — Z79899 Other long term (current) drug therapy: Secondary | ICD-10-CM

## 2022-09-30 DIAGNOSIS — J9601 Acute respiratory failure with hypoxia: Secondary | ICD-10-CM | POA: Diagnosis not present

## 2022-09-30 DIAGNOSIS — I2489 Other forms of acute ischemic heart disease: Secondary | ICD-10-CM

## 2022-09-30 LAB — TYPE AND SCREEN
ABO/RH(D): A NEG
Antibody Screen: NEGATIVE

## 2022-09-30 LAB — URINALYSIS, ROUTINE W REFLEX MICROSCOPIC
Bacteria, UA: NONE SEEN
Bilirubin Urine: NEGATIVE
Glucose, UA: NEGATIVE mg/dL
Ketones, ur: NEGATIVE mg/dL
Leukocytes,Ua: NEGATIVE
Nitrite: NEGATIVE
Protein, ur: NEGATIVE mg/dL
Specific Gravity, Urine: 1.009 (ref 1.005–1.030)
pH: 5 (ref 5.0–8.0)

## 2022-09-30 LAB — CBC
HCT: 26.8 % — ABNORMAL LOW (ref 39.0–52.0)
Hemoglobin: 9.1 g/dL — ABNORMAL LOW (ref 13.0–17.0)
MCH: 30.7 pg (ref 26.0–34.0)
MCHC: 34 g/dL (ref 30.0–36.0)
MCV: 90.5 fL (ref 80.0–100.0)
Platelets: 199 10*3/uL (ref 150–400)
RBC: 2.96 MIL/uL — ABNORMAL LOW (ref 4.22–5.81)
RDW: 14.9 % (ref 11.5–15.5)
WBC: 19.5 10*3/uL — ABNORMAL HIGH (ref 4.0–10.5)
nRBC: 0.2 % (ref 0.0–0.2)

## 2022-09-30 LAB — COMPREHENSIVE METABOLIC PANEL
ALT: 28 U/L (ref 0–44)
AST: 65 U/L — ABNORMAL HIGH (ref 15–41)
Albumin: 2.6 g/dL — ABNORMAL LOW (ref 3.5–5.0)
Alkaline Phosphatase: 100 U/L (ref 38–126)
Anion gap: 11 (ref 5–15)
BUN: 38 mg/dL — ABNORMAL HIGH (ref 8–23)
CO2: 23 mmol/L (ref 22–32)
Calcium: 8.1 mg/dL — ABNORMAL LOW (ref 8.9–10.3)
Chloride: 100 mmol/L (ref 98–111)
Creatinine, Ser: 1.59 mg/dL — ABNORMAL HIGH (ref 0.61–1.24)
GFR, Estimated: 43 mL/min — ABNORMAL LOW (ref >60)
Glucose, Bld: 120 mg/dL — ABNORMAL HIGH (ref 70–99)
Potassium: 3.6 mmol/L (ref 3.5–5.1)
Sodium: 134 mmol/L — ABNORMAL LOW (ref 135–145)
Total Bilirubin: 1.4 mg/dL — ABNORMAL HIGH (ref 0.3–1.2)
Total Protein: 6.1 g/dL — ABNORMAL LOW (ref 6.5–8.1)

## 2022-09-30 LAB — TSH: TSH: 0.94 u[IU]/mL (ref 0.350–4.500)

## 2022-09-30 LAB — C-REACTIVE PROTEIN: CRP: 15.5 mg/dL — ABNORMAL HIGH (ref <1.0)

## 2022-09-30 LAB — STREP PNEUMONIAE URINARY ANTIGEN: Strep Pneumo Urinary Antigen: NEGATIVE

## 2022-09-30 LAB — BLOOD GAS, ARTERIAL
Acid-Base Excess: 1 mmol/L (ref 0.0–2.0)
Bicarbonate: 25.2 mmol/L (ref 20.0–28.0)
O2 Saturation: 73.5 %
Patient temperature: 37
pCO2 arterial: 38 mmHg (ref 32–48)
pH, Arterial: 7.43 (ref 7.35–7.45)
pO2, Arterial: 44 mmHg — ABNORMAL LOW (ref 83–108)

## 2022-09-30 LAB — LACTIC ACID, PLASMA: Lactic Acid, Venous: 1.2 mmol/L (ref 0.5–1.9)

## 2022-09-30 LAB — MAGNESIUM: Magnesium: 2.2 mg/dL (ref 1.7–2.4)

## 2022-09-30 LAB — TROPONIN I (HIGH SENSITIVITY)
Troponin I (High Sensitivity): 2776 ng/L (ref <18)
Troponin I (High Sensitivity): 5348 ng/L (ref <18)

## 2022-09-30 LAB — D-DIMER, QUANTITATIVE: D-Dimer, Quant: >20 ug{FEU}/mL — ABNORMAL HIGH (ref 0.00–0.50)

## 2022-09-30 LAB — PROCALCITONIN: Procalcitonin: 0.18 ng/mL

## 2022-09-30 LAB — SEDIMENTATION RATE: Sed Rate: 28 mm/hr — ABNORMAL HIGH (ref 0–16)

## 2022-09-30 LAB — BRAIN NATRIURETIC PEPTIDE: B Natriuretic Peptide: 303.8 pg/mL — ABNORMAL HIGH (ref 0.0–100.0)

## 2022-09-30 LAB — FIBRINOGEN: Fibrinogen: 519 mg/dL — ABNORMAL HIGH (ref 210–475)

## 2022-09-30 MED ORDER — DEXAMETHASONE SODIUM PHOSPHATE 10 MG/ML IJ SOLN
6.0000 mg | Freq: Every day | INTRAMUSCULAR | Status: DC
  Administered 2022-09-30 – 2022-10-01 (×2): 6 mg via INTRAVENOUS
  Filled 2022-09-30 (×2): qty 1

## 2022-09-30 MED ORDER — METOPROLOL TARTRATE 25 MG PO TABS
25.0000 mg | ORAL_TABLET | Freq: Two times a day (BID) | ORAL | Status: DC
  Filled 2022-09-30: qty 1

## 2022-09-30 MED ORDER — LINEZOLID 600 MG/300ML IV SOLN
600.0000 mg | Freq: Two times a day (BID) | INTRAVENOUS | Status: DC
  Administered 2022-09-30 (×2): 600 mg via INTRAVENOUS
  Filled 2022-09-30 (×4): qty 300

## 2022-09-30 MED ORDER — ACETAMINOPHEN 10 MG/ML IV SOLN
650.0000 mg | Freq: Four times a day (QID) | INTRAVENOUS | Status: DC
  Administered 2022-09-30 – 2022-10-01 (×2): 650 mg via INTRAVENOUS
  Filled 2022-09-30 (×4): qty 65

## 2022-09-30 MED ORDER — PANTOPRAZOLE SODIUM 40 MG IV SOLR
40.0000 mg | Freq: Two times a day (BID) | INTRAVENOUS | Status: DC
  Administered 2022-09-30 – 2022-10-01 (×3): 40 mg via INTRAVENOUS
  Filled 2022-09-30 (×3): qty 10

## 2022-09-30 MED ORDER — ASPIRIN 81 MG PO TBEC: 81.0000 mg | DELAYED_RELEASE_TABLET | Freq: Every day | ORAL | Status: DC

## 2022-09-30 MED ORDER — SODIUM CHLORIDE 0.9 % IV SOLN
2.0000 g | Freq: Two times a day (BID) | INTRAVENOUS | Status: DC
  Administered 2022-09-30 – 2022-10-01 (×2): 2 g via INTRAVENOUS
  Filled 2022-09-30 (×2): qty 12.5

## 2022-09-30 MED ORDER — ATORVASTATIN CALCIUM 80 MG PO TABS: 80.0000 mg | ORAL_TABLET | Freq: Every day | ORAL | Status: DC

## 2022-09-30 MED ORDER — ACETAMINOPHEN 10 MG/ML IV SOLN
1000.0000 mg | Freq: Four times a day (QID) | INTRAVENOUS | Status: DC
  Filled 2022-09-30 (×3): qty 100

## 2022-09-30 MED ORDER — TOCILIZUMAB 400 MG/20ML IV SOLN
8.0000 mg/kg | Freq: Once | INTRAVENOUS | Status: AC
  Administered 2022-09-30: 660 mg via INTRAVENOUS
  Filled 2022-09-30: qty 33

## 2022-09-30 MED ORDER — SODIUM CHLORIDE 0.9 % IV SOLN
2.0000 g | INTRAVENOUS | Status: DC
  Filled 2022-09-30: qty 20

## 2022-09-30 MED ORDER — HALOPERIDOL LACTATE 5 MG/ML IJ SOLN
3.0000 mg | Freq: Once | INTRAMUSCULAR | Status: AC | PRN
  Administered 2022-09-30: 3 mg via INTRAVENOUS
  Filled 2022-09-30: qty 1

## 2022-09-30 MED ORDER — ASPIRIN 81 MG PO CHEW: 81.0000 mg | CHEWABLE_TABLET | Freq: Once | ORAL | Status: DC

## 2022-09-30 MED ORDER — DOXAZOSIN MESYLATE 8 MG PO TABS
8.0000 mg | ORAL_TABLET | Freq: Every day | ORAL | Status: DC
  Filled 2022-09-30: qty 1

## 2022-09-30 MED ORDER — ACETAMINOPHEN 325 MG PO TABS: 650.0000 mg | ORAL_TABLET | Freq: Four times a day (QID) | ORAL | Status: DC | PRN

## 2022-09-30 MED ORDER — FUROSEMIDE 10 MG/ML IJ SOLN: 80.0000 mg | Freq: Once | INTRAMUSCULAR | Status: DC

## 2022-09-30 MED ORDER — SODIUM CHLORIDE 0.9 % IV SOLN
500.0000 mg | INTRAVENOUS | Status: DC
  Filled 2022-09-30: qty 5

## 2022-09-30 MED ORDER — IPRATROPIUM-ALBUTEROL 0.5-2.5 (3) MG/3ML IN SOLN
3.0000 mL | RESPIRATORY_TRACT | Status: DC
  Administered 2022-09-30 – 2022-10-01 (×6): 3 mL via RESPIRATORY_TRACT
  Filled 2022-09-30 (×6): qty 3

## 2022-09-30 MED ORDER — FUROSEMIDE 10 MG/ML IJ SOLN
80.0000 mg | Freq: Two times a day (BID) | INTRAMUSCULAR | Status: DC
  Administered 2022-09-30 (×2): 80 mg via INTRAVENOUS
  Filled 2022-09-30 (×2): qty 8

## 2022-09-30 NOTE — Progress Notes (Signed)
New Admission Note:   Arrival Method: Stretcher  Mental Orientation: Alert oriented x2  Telemetry: wall monitor  Assessment: Completed Skin: intact  IV: right forearm  Pain: 0/10  Tubes: high flower [place]  Safety Measures: Safety Fall Prevention Plan has been given, discussed and signed Admission: Completed Orientation: Patient has been orientated to the room, unit and staff.  Family: none   Orders have been reviewed and implemented. Will continue to monitor the patient. Call light has been placed within reach and bed alarm has been activated.   Norman Clay RN [facility]  Phone: [phone]

## 2022-09-30 NOTE — Significant Event (Signed)
Rapid Response Event Note   Reason for Call :  Asked to see during rounds, pt just arrived as new admit from [facility]  Initial Focused Assessment:  Patient lying with labored breathing, able to answer questions with 1-2 words, A&O x3. Skin warm and dry, minimal edema present in BLE. Lungs clear bilaterally, heart tones normal. Patient endorses shortness of breath. Cough present producing thick, bloody sputum.   135/65 (85) HR 125 RR 38 O2 82% NRB 15L  Interventions:  O2 increased upper 80% with NRB 15L RT to bedside Placed on HHFNC, 50L, 100%, O2 remains 86-92%  Admit MD paged, to bedside, orders placed ABG Discussed use of Bipap with RT/MD, though patient unable to demonstrate getting hands to face--required to get bipap mask off.   Plan of Care:  Use of NRB 15L on top of HHFNC if patient desats <90%.  Discuss code status with patient/family per MD as pt may require higher level of care given respiratory status.  May need higher level of care requiring ICU transfer.   Event Summary:  MD Notified: Delia Chimes MD Call Time: 32, notified on unit Arrival Time: 1045 End Time:  Truddie Crumble, RN

## 2022-09-30 NOTE — Progress Notes (Deleted)
Pharmacy Antibiotic Note  Eric Benitez is a 82 y.o. male admitted on 10/03/2022 with pneumonia. Pharmacy has been consulted for vancomycin dosing.  Plan: Vancomycin 1750 mg IV loading dose x1 Start vancomycin 1250 mg q24h (Scr 1.33 from [facility] 8/24, weight 82.6kg) eAUC 506 Monitor vancomycin levels as clinically indicated Monitor cultures, renal function, and clinical status    Temp (24hrs), Avg:98.5 F (36.9 C), Min:98.5 F (36.9 C), Max:98.5 F (36.9 C)  No results for input(s): "WBC", "CREATININE", "LATICACIDVEN", "VANCOTROUGH", "VANCOPEAK", "VANCORANDOM", "GENTTROUGH", "GENTPEAK", "GENTRANDOM", "TOBRATROUGH", "TOBRAPEAK", "TOBRARND", "AMIKACINPEAK", "AMIKACINTROU", "AMIKACIN" in the last 168 hours.  CrCl cannot be calculated (Patient's most recent lab result is older than the maximum 21 days allowed.).    Allergies  Allergen Reactions   Vancomycin Rash    Antimicrobials this admission: vancomycin 8/24 >>  cefepime 8/24 >>   Dose adjustments this admission: none  Microbiology results: 8/24 Sputum: sent  8/24 MRSA PCR: sent  Thank you for allowing pharmacy to be a part of this patient's care.  Romie Minus, PharmD PGY1 Pharmacy Resident  Please check AMION for all [facility] Pharmacy phone numbers After 10:00 PM, call Main Pharmacy [phone]

## 2022-09-30 NOTE — H&P (Addendum)
History and Physical    Patient: Eric Benitez:[id number] DOB: 01-27-1941 DOA: 09/26/2022 DOS: the patient was seen and examined on 09/14/2022 PCP: Estanislado Pandy, MD  Patient coming from: Outside Hospital  Chief Complaint: No chief complaint on file.  HPI: Eric Benitez is a 82 y.o. male with medical history significant of IPF not on home oxygen, CAD, permeant Afib on eliquis, HFpEF, GERD, HTN transferred from [facility] for c/f NSTEMI.  He also has acute hypoxic respiratory failure secondary to severe COVID with possible superimposed bacterial pneumonia.  Patient was admitted on the 18th to [facility] for COVID.  He tested positive on the 14th per their note. He does not wear oxygen at home.    He was transferred here due to NSTEMI without cardiology coverage at [facility]. Pt only able to provide limited history due to critical condition. History obtained from family over the phone and through chart review.   Confirmed with pt and wife + family in person that patient is DNR/DNI.    Review of Systems: unable to review all systems due to the inability of the patient to answer questions. Past Medical History:  Diagnosis Date   Arthritis    "neck, hands, fingers" (04/09/2014)   Bleeds easily (HCC)    CAD (coronary artery disease)    a. s/p prior stenting of LAD b.  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 (HCC)    Pneumonia    Sleep apnea    Past Surgical History:  Procedure Laterality Date   ANKLE FRACTURE SURGERY Right 2009   CARDIAC CATHETERIZATION  1990's X 2;  04/09/2014   CARDIOVERSION N/A 06/07/2018   Procedure: CARDIOVERSION;  Surgeon: Dolores Patty, MD;  Location: [facility] ENDOSCOPY;  Service:  Cardiovascular;  Laterality: N/A;   CATARACT EXTRACTION W/PHACO Left 01/16/2022   Procedure: CATARACT EXTRACTION PHACO AND INTRAOCULAR LENS PLACEMENT (IOC);  Surgeon: Fabio Pierce, MD;  Location: [facility] ORS;  Service: Ophthalmology;  Laterality: Left;  CDE 14.63   CATARACT EXTRACTION W/PHACO Right 02/03/2022   Procedure: CATARACT EXTRACTION PHACO AND INTRAOCULAR LENS PLACEMENT (IOC);  Surgeon: Fabio Pierce, MD;  Location: [facility] ORS;  Service: Ophthalmology;  Laterality: Right;  CDE 11.94   COLONOSCOPY N/A 01/22/2017   Dr. Darrick Penna: 6 tubular adenomas removed, external hemorrhoids.  Recommended next exam in 3 years.   COLONOSCOPY WITH PROPOFOL N/A 03/02/2020   Surgeon: Earnest Bailey K, DO; Nonbleeding internal hemorrhoids, multiple diverticula in the sigmoid, descending, transverse colon, 2 polyps 4 to 6 mm in size and one 2 mm polyp.  Pathology with tubular adenoma and hyperplastic polyp. Recommended repeat in 5 years.   CORONARY ANGIOPLASTY WITH STENT PLACEMENT  2006   "2"   CORONARY PRESSURE/FFR STUDY N/A 06/08/2017   Procedure: INTRAVASCULAR PRESSURE WIRE/FFR STUDY;  Surgeon: Yvonne Kendall, MD;  Location: [facility] INVASIVE CV LAB;  Service: Cardiovascular;  Laterality: N/A;   CYSTOSCOPY W/ STONE MANIPULATION  ~ 2008   ESOPHAGOGASTRODUODENOSCOPY N/A 01/22/2017   Dr. Darrick Penna: Mild to moderate gastritis.  No H. pylori   ESOPHAGOGASTRODUODENOSCOPY N/A 05/31/2018   Procedure: ESOPHAGOGASTRODUODENOSCOPY (EGD);  Surgeon: [place] Bali, MD;  normal examined esophagus, gastritis due to ASA, and normal examined duodenum.  No obvious source for melena.    FRACTURE SURGERY     LEFT HEART CATH AND CORONARY ANGIOGRAPHY N/A 06/08/2017   Procedure: LEFT HEART CATH AND CORONARY ANGIOGRAPHY;  Surgeon: Yvonne Kendall, MD;  Location: [facility] INVASIVE CV LAB;  Service: Cardiovascular;  Laterality: N/A;   LEFT HEART CATH AND CORONARY ANGIOGRAPHY N/A 06/05/2018   Procedure: LEFT HEART CATH AND CORONARY ANGIOGRAPHY;  Surgeon:  Lyn Records, MD;  Location: [facility] INVASIVE CV LAB;  Service: Cardiovascular;  Laterality: N/A;   LEFT HEART CATH AND CORONARY ANGIOGRAPHY N/A 06/16/2022   Procedure: LEFT HEART CATH AND CORONARY ANGIOGRAPHY;  Surgeon: Marykay Lex, MD;  Location: [facility] INVASIVE CV LAB;  Service: Cardiovascular;  Laterality: N/A;   LEFT HEART CATHETERIZATION WITH CORONARY ANGIOGRAM N/A 04/09/2014   Procedure: LEFT HEART CATHETERIZATION WITH CORONARY ANGIOGRAM;  Surgeon: Marykay Lex, MD;  Location: [facility] CATH LAB;  Service: Cardiovascular;  Laterality: N/A;   NASAL SEPTUM SURGERY  1999   POLYPECTOMY  01/22/2017   Procedure: POLYPECTOMY;  Surgeon: [place] Bali, MD;  Location: [facility] ENDO SUITE;  Service: Endoscopy;;  right colon x4   POLYPECTOMY  03/02/2020   Procedure: POLYPECTOMY;  Surgeon: Lanelle Bal, DO;  Location: [facility] ENDO SUITE;  Service: Endoscopy;;   TEE WITHOUT CARDIOVERSION N/A 06/07/2018   Procedure: TRANSESOPHAGEAL ECHOCARDIOGRAM (TEE);  Surgeon: Dolores Patty, MD;  Location: [facility] ENDOSCOPY;  Service: Cardiovascular;  Laterality: N/A;   Social History:  reports that he quit smoking about 58 years ago. His smoking use included cigarettes. He started smoking about 79 years ago. He has a 33 pack-year smoking history. He has never used smokeless tobacco. He reports that he does not drink alcohol and does not use drugs.  Allergies  Allergen Reactions   Vancomycin Rash    Family History  Problem Relation Age of Onset   Heart disease Mother    Colon cancer Neg Hx    Gastric cancer Neg Hx    Esophageal cancer Neg Hx     Prior to Admission medications   Medication Sig Start Date End Date Taking? Authorizing Provider  albuterol (VENTOLIN HFA) 108 (90 Base) MCG/ACT inhaler Inhale 1-2 puffs into the lungs every 4 (four) hours as needed for wheezing or shortness of breath.   Yes [provider]  amLODipine (NORVASC) 2.5 MG tablet Take 1 tablet (2.5 mg total) by mouth daily. 06/20/22  Yes  Branch, Dorothe Pea, MD  apixaban (ELIQUIS) 5 MG TABS tablet Take 1 tablet (5 mg total) by mouth every 12 (twelve) hours. 06/07/18  Yes Bhagat, Bhavinkumar, PA  atorvastatin (LIPITOR) 80 MG tablet Take 80 mg by mouth at bedtime.    Yes [provider]  clindamycin (CLEOCIN) 300 MG capsule Take 600 mg by mouth 2 (two) times daily. 08/09/22  Yes [provider]  Docusate Sodium (STOOL SOFTENER LAXATIVE PO) Take 2 tablets by mouth at bedtime.   Yes [provider]  doxazosin (CARDURA) 8 MG tablet Take 8 mg by mouth at bedtime.    Yes [provider]  fluorometholone (FML) 0.1 % ophthalmic suspension Place 1 drop into the right eye 4 (four) times daily. 06/28/22  Yes [provider]  fluticasone (FLONASE) 50 MCG/ACT nasal spray Place 2 sprays into the nose daily as needed (allergies).   Yes [provider]  furosemide (LASIX) 20 MG tablet Take 1 tablet (20 mg total) by mouth daily. (May take an additional 20mg  tab as needed for swelling.) 06/20/22  Yes Branch,  Dorothe Pea, MD  losartan (COZAAR) 25 MG tablet Take 1 tablet by mouth daily. 08/12/22  Yes [provider]  metoprolol succinate (TOPROL-XL) 25 MG 24 hr tablet Take 25 mg by mouth daily.    Yes [provider]  Nintedanib (OFEV) 150 MG CAPS Take 1 capsule (150 mg total) by mouth 2 (two) times daily. 08/29/21  Yes Oretha Milch, MD  nitroGLYCERIN (NITROSTAT) 0.4 MG SL tablet PLACE 1 TABLET (0.4 MG TOTAL) UNDER THE TONGUE EVERY 5 (FIVE) MINUTES AS NEEDED FOR CHEST PAIN. 06/29/17  Yes Antoine Poche, MD  pantoprazole (PROTONIX) 40 MG tablet TAKE 1 TABLET BY MOUTH DAILY 11/10/15  Yes Branch, Dorothe Pea, MD  potassium chloride (KLOR-CON) 10 MEQ tablet Take 1 tablet (10 mEq total) by mouth daily. 07/25/21  Yes Dyann Kief, PA-C  predniSONE (DELTASONE) 20 MG tablet Take 3 tablets once daily for 5 days followed by 2 tablets once daily for 5 days followed by 1 tablet once daily for 5 days  and then stop 06/17/22  Yes Osvaldo Shipper, MD  ranolazine (RANEXA) 500 MG 12 hr tablet Take 500 mg by mouth 2 (two) times daily.  12/31/17  Yes [provider]    Physical Exam: Vitals:   09/12/2022 1038 09/27/2022 1100 09/30/22 1137  BP: 135/65 131/66   Pulse:  (!) 116   Resp: (!) 38 (!) 32   Temp: 98.5 F (36.9 C)    TempSrc: Axillary    SpO2:  91% 97%   Physical Exam Vitals and nursing note reviewed.  Constitutional:      General: He is in acute distress.     Appearance: He is ill-appearing and toxic-appearing.  HENT:     Head: Normocephalic and atraumatic.  Cardiovascular:     Rate and Rhythm: Tachycardia present.     Heart sounds: No murmur heard. Pulmonary:     Effort: Respiratory distress present.     Breath sounds: Wheezing and rales present.  Abdominal:     General: Abdomen is flat. There is no distension.     Palpations: Abdomen is soft.     Tenderness: There is no abdominal tenderness.  Musculoskeletal:     Right lower leg: No edema.     Left lower leg: No edema.     Data Reviewed:     Latest Ref Rng & Units 06/17/2022    2:14 AM 06/15/2022   10:03 AM 06/13/2022   10:19 AM  CBC  WBC 4.0 - 10.5 K/uL 9.1  9.1  7.5   Hemoglobin 13.0 - 17.0 g/dL 21.3  08.6  57.8   Hematocrit 39.0 - 52.0 % 35.5  36.8  37.9   Platelets 150 - 400 K/uL 248  275  238       Latest Ref Rng & Units 06/23/2022    1:43 PM 06/17/2022    2:14 AM 06/16/2022    7:13 AM  CMP  Glucose 70 - 99 mg/dL 469  629  528   BUN 8 - 27 mg/dL 24  21  26    Creatinine 0.76 - 1.27 mg/dL 4.13  2.44  0.10   Sodium 134 - 144 mmol/L 136  136  133   Potassium 3.5 - 5.2 mmol/L 4.4  4.9  4.4   Chloride 96 - 106 mmol/L 96  104  100   CO2 20 - 29 mmol/L 22  25  24    Calcium 8.6 - 10.2 mg/dL 9.2  8.8  8.7  U/S:No gallbladder wall thickening visualized. 9 mm layering calculus is seen in the neck of the gallbladder. No sonographic Murphy sign noted by sonographer.   CT: Imaging Results - CT Abdomen  Pelvis W Contrast (09/24/2022 6:38 PM EDT) Impressions  09/24/2022 7:26 PM EDT  Small layering gallstones. No CT evidence of acute cholecystitis.  Left lower pole nephrolithiasis. No hydronephrosis.  Trace bilateral pleural effusions. Scarring/fibrosis in the lung bases.  Aortoiliac atherosclerosis.  No acute findings in the abdomen or pelvis.   HIDA: Gallbladder is visualized compatible with a patent cystic duct   Assessment and Plan: Eric Benitez is a 82 y.o. male with medical history significant of IPF not on home oxygen, CAD, permeant Afib on eliquis, HFpEF, GERD, HTN transferred from [facility] for c/f NSTEMI.  He also has acute hypoxic respiratory failure secondary to severe COVID with possible superimposed bacterial pneumonia  AHRF COVID PNA Hx of ILD Patient was admitted on the 18th to [facility] for COVID.  He tested positive on the 14th per their note. He does not wear oxygen at home. Respiratory status worsened 8/22 increased from 4 L to NRB. He was started on IV antibiotics (initially ceftriaxone and azithromycin, then cefepime/vanc on 8.23) and started on decadron IV. He was not treated with remdesivir or other immunomodulatory therapy. He was transferred here for elevated troponin and c/f NSTEMI. He arrived to the floor on 15 L NRB with O2 saturation in high 80s/low 90s. Discussed with patient and family that c/f progressive worsening hypoxia that would require intubation but family/patient say he has an active DNR/DNI. Will treat for COVID and bacterial PNA with aggressive diuresis. Pulm consulted as well.  -dexamethasone 6 mg IV  -did not receive remdesivir at OSH, d/w pulm give 1 x dose tocilizumab  -lasix 80 mg IV BID -cover for pna with cefepime and linezolid -BC at OSH NGTD -hold pirfenidone -pulm consult  C/f NSTEMI CAD with LAD to PCI ~10 years ago Transferred from [facility] for NSTEMI as patient follows here. EKG unable to view from OSH but  reportedly unchanged, with troponin elevation from 1050 -> 1524 over several hours. He reports no current chest pain. Wife reports episode of chest pain last week. EKG here shows new inferior q-waves. Suspect this is more demand ischemia. Recent LHC 06/2022 with LM normal, mid LAD 20%, D1 60%, LCX normal, RCA moderate diffuse disease with catheter induced vasospasm. Patient is having hemoptysis so will hold aspirin and DOAC at this time.  -hold aspirin -hold eliquis due to hemoptysis -TTE -continue statin -trend troponin -cardiology consult  AKI Hx of BPH -place foley -renal US -diuresis as above  Normocytic anemia Baseline 12-13, now at 8.5. Partially due to fluids given at OSH, but also having some hemoptysis from pulmonary infection/congestion -type and screen -holding AC and anti-platelets as above  Atrial fibrillation -holding eliquis at this time due to hemoptysis  -holding BB for now -telemetry  Abd pain Reported episode of abd pain and elevated bilirubin/transaminases, at OSH got [place], CT and HIDA scan that were normal.   HTN -hold medications   Advance Care Planning:   Code Status: DNR   Consults: pulmonlogy, cardiology  Family Communication: discussed with wife/daughter over the phone  Severity of Illness: The appropriate patient status for this patient is INPATIENT. Inpatient status is judged to be reasonable and necessary in order to provide the required intensity of service to ensure the patient's safety. The patient's presenting symptoms, physical exam findings, and  initial radiographic and laboratory data in the context of their chronic comorbidities is felt to place them at high risk for further clinical deterioration. Furthermore, it is not anticipated that the patient will be medically stable for discharge from the hospital within 2 midnights of admission.   * I certify that at the point of admission it is my clinical judgment that the patient will require  inpatient hospital care spanning beyond 2 midnights from the point of admission due to high intensity of service, high risk for further deterioration and high frequency of surveillance required.*  Author: Charolotte Eke, MD 09/09/2022 12:10 PM  For on call review [email or web address].

## 2022-09-30 NOTE — Plan of Care (Signed)
  Problem: Education: Goal: Knowledge of General Education information will improve Description Including pain rating scale, medication(s)/side effects and non-pharmacologic comfort measures Outcome: Progressing   

## 2022-09-30 NOTE — Consult Note (Signed)
NAME:  Eric Benitez, MRN:  [id number], DOB:  Jun 07, 1940, LOS: 0 ADMISSION DATE:  09/09/2022, CONSULTATION DATE: 09/20/2022 REFERRING MD: Dr. Allena Katz, CHIEF COMPLAINT: Hypoxemic respiratory failure  History of Present Illness:  Patient was transferred from [facility] with COVID, hypoxemic respiratory failure May have a pneumonia.  Underlying history of pulmonary fibrosis for which she was on Ofev His spouse got COVID and subsequently contracted it as well Tested positive on the 14th Worsening respiratory status with a cough productive of brownish phlegm, increased, some bloody secretions Non-ST elevation MI, underlying GERD, hypertension, coronary artery disease  Pertinent  Medical History   Past Medical History:  Diagnosis Date   Arthritis    "neck, hands, fingers" (04/09/2014)   Bleeds easily (HCC)    CAD (coronary artery disease)    a. s/p prior stenting of LAD b.  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 rate (!) 27, SpO2 96%.    FiO2 (%):  [100 %] 100 %  No intake or output data in the 24 hours ending 09/28/2022 1328 There were no vitals filed for this visit.  Examination: General: Elderly, acutely ill-appearing, frail HENT: Dry oral mucosa Lungs: Rales, rhonchi  bilaterally Cardiovascular: S1-S2 appreciated, tachycardic Abdomen: Bowel sounds appreciated Extremities: Warm and dry Neuro: Awake, interactive GU:   I reviewed nursing notes,  hospitalist notes, last 24 h vitals and pain scores, last 48 h intake and output, last 24 h labs and trends, and last 24 h imaging results.  Recent PFT with mild restrictive disease with severe reduction in diffusing capacity 08/28/2022  Echocardiogram 06/16/2022-normal ejection fraction, no pulmonary hypertension  Resolved Hospital Problem list     Assessment & Plan:  Elderly gentleman with underlying history of pulmonary fibrosis, was not on home oxygen, recently tested positive for COVID with worsening symptoms now on high flow oxygen.  Elevated cardiac enzymes NSTEMI.  COVID-pneumonia -Continue steroids -I do believe he may benefit from tocilizumab  Possible pneumonia -Agree with antibiotics -Continue cefepime linezolid -Follow cultures  Hemoptysis likely related to his pneumonia and COVID infection -Continue treatment of underlying  NSTEMI History of coronary artery disease May be related to demand ischemia -Will benefit from echo  Acute kidney injury -Cautious diuresis -Maintain renal perfusion -Renally dose medications  Atrial fibrillation -Anticoagulation on hold at present secondary to hemoptysis  Discussed with family at bedside They are very realistic about the outlook, continue aggressive care but they would want him comfortable if he were to continue to deteriorate  Best Practice (right click and "Reselect all SmartList Selections" daily)   Diet/type: NPO DVT prophylaxis: SCD GI prophylaxis: N/A Lines: N/A Foley:  N/A Code Status:  DNR Last date of multidisciplinary goals of care discussion [discussed with spouse and other family members at bedside]  Labs  CBC: No results for input(s): "WBC", "NEUTROABS", "HGB", "HCT", "MCV", "PLT" in the last 168 hours.  Basic  Metabolic Panel: No results for input(s): "NA", "K", "CL", "CO2", "GLUCOSE", "BUN", "CREATININE", "CALCIUM", "MG", "PHOS" in the last 168 hours. GFR: CrCl cannot be calculated (Patient's most recent lab result is older than the maximum 21 days allowed.). No results for input(s): "PROCALCITON", "WBC", "LATICACIDVEN" in the last 168 hours.  Liver Function Tests: No results for input(s): "AST", "ALT", "ALKPHOS", "BILITOT", "PROT", "ALBUMIN" in the last 168 hours. No results for input(s): "LIPASE", "AMYLASE" in the last 168 hours. No results for input(s): "AMMONIA" in the last 168 hours.  ABG    Component Value Date/Time   PHART 7.43 09/16/2022 1134   PCO2ART 38 09/13/2022 1134   PO2ART 44 (L) 10/04/2022 1134   HCO3 25.2 09/29/2022 1134   O2SAT 73.5 09/12/2022 1134     Coagulation Profile: No results for input(s): "INR", "PROTIME" in the last 168 hours.  Cardiac Enzymes: No results for input(s): "CKTOTAL", "CKMB", "CKMBINDEX", "TROPONINI" in the last 168 hours.  HbA1C: Hgb A1c MFr Bld  Date/Time Value Ref Range Status  06/05/2018 05:39 AM 5.8 (H) 4.8 - 5.6 % Final    Comment:    (NOTE) Pre diabetes:          5.7%-6.4% Diabetes:              >6.4% Glycemic control for   <7.0% adults with diabetes     CBG: No results for input(s): "GLUCAP" in the last 168 hours.  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   Surgical History:   Past Surgical History:  Procedure Laterality Date   ANKLE FRACTURE SURGERY Right 2009   CARDIAC CATHETERIZATION  1990's X 2;  04/09/2014   CARDIOVERSION N/A 06/07/2018   Procedure: CARDIOVERSION;   Surgeon: Dolores Patty, MD;  Location: [facility] ENDOSCOPY;  Service: Cardiovascular;  Laterality: N/A;   CATARACT EXTRACTION W/PHACO Left 01/16/2022   Procedure: CATARACT EXTRACTION PHACO AND INTRAOCULAR LENS PLACEMENT (IOC);  Surgeon: Fabio Pierce, MD;  Location: [facility] ORS;  Service: Ophthalmology;  Laterality: Left;  CDE 14.63   CATARACT EXTRACTION W/PHACO Right 02/03/2022   Procedure: CATARACT EXTRACTION PHACO AND INTRAOCULAR LENS PLACEMENT (IOC);  Surgeon: Fabio Pierce, MD;  Location: [facility] ORS;  Service: Ophthalmology;  Laterality: Right;  CDE 11.94   COLONOSCOPY N/A 01/22/2017   Dr. Darrick Penna: 6 tubular adenomas removed, external hemorrhoids.  Recommended next exam in 3 years.   COLONOSCOPY WITH PROPOFOL N/A 03/02/2020   Surgeon: Earnest Bailey K, DO; Nonbleeding internal hemorrhoids, multiple diverticula in the sigmoid, descending, transverse colon, 2 polyps 4 to 6 mm in size and one 2 mm polyp.  Pathology with tubular adenoma and hyperplastic polyp. Recommended repeat in 5 years.   CORONARY ANGIOPLASTY WITH STENT PLACEMENT  2006   "2"   CORONARY PRESSURE/FFR STUDY N/A 06/08/2017   Procedure: INTRAVASCULAR PRESSURE WIRE/FFR STUDY;  Surgeon: Yvonne Kendall, MD;  [facility] INVASIVE CV LAB;  Service: Cardiovascular;  Laterality: N/A;   CYSTOSCOPY W/ STONE MANIPULATION  ~ 2008   ESOPHAGOGASTRODUODENOSCOPY N/A 01/22/2017   Dr. Darrick Penna: Mild to moderate gastritis.  No H. pylori   ESOPHAGOGASTRODUODENOSCOPY N/A 05/31/2018  Procedure: ESOPHAGOGASTRODUODENOSCOPY (EGD);  Surgeon: West Bali, MD;  normal examined esophagus, gastritis due to ASA, and normal examined duodenum.  No obvious source for melena.    FRACTURE SURGERY     LEFT HEART CATH AND CORONARY ANGIOGRAPHY N/A 06/08/2017   Procedure: LEFT HEART CATH AND CORONARY ANGIOGRAPHY;  Surgeon: Yvonne Kendall, MD;  [facility]: [facility] INVASIVE CV LAB;  Service: Cardiovascular;  Laterality: N/A;   LEFT HEART CATH AND CORONARY ANGIOGRAPHY N/A 06/05/2018    Procedure: LEFT HEART CATH AND CORONARY ANGIOGRAPHY;  Surgeon: Lyn Records, MD;  [facility]: [facility] INVASIVE CV LAB;  Service: Cardiovascular;  Laterality: N/A;   LEFT HEART CATH AND CORONARY ANGIOGRAPHY N/A 06/16/2022   Procedure: LEFT HEART CATH AND CORONARY ANGIOGRAPHY;  Surgeon: Marykay Lex, MD;  Location: [facility] INVASIVE CV LAB;  Service: Cardiovascular;  Laterality: N/A;   LEFT HEART CATHETERIZATION WITH CORONARY ANGIOGRAM N/A 04/09/2014   Procedure: LEFT HEART CATHETERIZATION WITH CORONARY ANGIOGRAM;  Surgeon: Marykay Lex, MD;  Location: [facility] CATH LAB;  Service: Cardiovascular;  Laterality: N/A;   NASAL SEPTUM SURGERY  1999   POLYPECTOMY  01/22/2017   Procedure: POLYPECTOMY;  Surgeon: West Bali, MD;  [facility]: [facility] ENDO SU[facility];  Service: Endoscopy;;  right colon x4   POLYPECTOMY  03/02/2020   Procedure: POLYPECTOMY;  Surgeon: Lanelle Bal, DO;  [facility]: [facility] ENDO SU[facility];  Service: Endoscopy;;   TEE WITHOUT CARDIOVERSION N/A 06/07/2018   Procedure: TRANSESOPHAGEAL ECHOCARDIOGRAM (TEE);  Surgeon: Dolores Patty, MD;  Location: [facility] ENDOSCOPY;  Service: Cardiovascular;  Laterality: N/A;     Social History:   reports that he quit smoking about 58 years ago. His smoking use included cigarettes. He started smoking about 79 years ago. He has a 33 pack-year smoking history. He has never used smokeless tobacco. He reports that he does not drink alcohol and does not use drugs.   Family History:  His family history includes Heart disease in his mother. There is no history of Colon cancer, Gastric cancer, or Esophageal cancer.   Allergies Allergies  Allergen Reactions   Vancomycin Rash

## 2022-09-30 NOTE — Progress Notes (Signed)
MEWS Progress Note  Patient Details Name: Eric Benitez MRN: [id number] DOB: 04-07-1940 Today's Date: 10/04/2022   MEWS Flowsheet Documentation:  Assess: MEWS Score Temp: 99.5 F (37.5 C) BP: 127/67 MAP (mmHg): 83 Pulse Rate: (!) 128 ECG Heart Rate: (!) 123 Resp: (!) 31 Level of Consciousness: Alert SpO2: 94 % O2 Device: High Flow Nasal Cannula, NRB Heater temperature: 87.8 F (31 C) O2 Flow Rate (L/min): 50 L/min FiO2 (%): 100 % Assess: MEWS Score MEWS Temp: 0 MEWS Systolic: 0 MEWS Pulse: 2 MEWS RR: 2 MEWS LOC: 0 MEWS Score: 4 MEWS Score Color: Red Assess: SIRS CRITERIA SIRS Temperature : 0 SIRS Respirations : 1 SIRS Pulse: 1 SIRS WBC: 1 SIRS Score Sum : 3 SIRS Temperature : 0 SIRS Pulse: 1 SIRS Respirations : 1 SIRS WBC: 1 SIRS Score Sum : 3 Assess: if the MEWS score is Yellow or Red Were vital signs accurate and taken at a resting state?: Yes Does the patient meet 2 or more of the SIRS criteria?: Yes Does the patient have a confirmed or suspected source of infection?: Yes MEWS guidelines implemented : Yes, red Treat MEWS Interventions: Considered administering scheduled or prn medications/treatments as ordered Take Vital Signs Increase Vital Sign Frequency : Red: Q1hr x2, continue Q4hrs until patient remains green for 12hrs Escalate MEWS: Escalate: Red: Discuss with charge nurse and notify provider. Consider notifying RRT. If remains red for 2 hours consider need for higher level of care Notify: Charge Nurse/RN Name of Charge Nurse/RN Notified: [facility] Provider Notification Provider Name/Title: MD Allena Katz Date Provider Notified: 09/29/2022 Time Provider Notified: 1709 Method of Notification: Call Notification Reason: Other (Comment) Provider response: At bedside Date of Provider Response: 09/11/2022 Time of Provider Response: [id number]      Madolyn Frieze Evertte Sones 09/29/2022, 5:40 PM

## 2022-09-30 NOTE — Consult Note (Signed)
Cardiology Consultation   Patient ID: Eric Benitez MRN: [id number]; DOB: August 28, 1940  Admit date: 10/04/2022 Date of Consult: 09/20/2022  PCP:  Estanislado Pandy, MD   [facility] Providers Cardiologist:  Dina Rich, MD        Patient Profile:   Eric Benitez is a 82 y.o. male with a hx of IPF, permanent atrial fibrillation, chronic diastolic heart failure, CAD, hypertension who is being seen 09/18/2022 for the evaluation of troponin elevation at the request of Dr. Allena Katz.  History of Present Illness:   Eric Benitez is an 82 year old male with a history of IPF, permanent atrial fibrillation, chronic diastolic heart failure, CAD, hypertension who we are consulted by Dr. Allena Katz for evaluation of troponin elevation.  He tested positive for COVID-19 on 8/14.  Has a history of pulmonary fibrosis for which he is on Ofev.  Was admitted to [facility] on 8/18.  On 8/22 he was noted to have worsening respiratory status, went from 4 L to NRB.  He was started on IV Decadron and IV antibiotics.  Noted to have troponin elevation (1050 > 1524).  Transferred to [facility] given NSTEMI.  Had recent [facility] 06/2022 with nonobstructive CAD.  Echo 06/2022 showed EF 65 to 70%, normal RV function, no significant valvular disease.  Patient having hemoptysis, home Eliquis and aspirin have been held.  Patient reports he had chest pain last night, none currently.   Past Medical History:  Diagnosis Date   Arthritis    "neck, hands, fingers" (04/09/2014)   Bleeds easily (HCC)    CAD (coronary artery disease)    a. s/p prior stenting of LAD b.  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 (HCC)    Pneumonia    Sleep apnea     Past Surgical  History:  Procedure Laterality Date   ANKLE FRACTURE SURGERY Right 2009   CARDIAC CATHETERIZATION  1990's X 2;  04/09/2014   CARDIOVERSION N/A 06/07/2018   Procedure: CARDIOVERSION;  Surgeon: Dolores Patty, MD;  Location: [facility] ENDOSCOPY;  Service: Cardiovascular;  Laterality: N/A;   CATARACT EXTRACTION W/PHACO Left 01/16/2022   Procedure: CATARACT EXTRACTION PHACO AND INTRAOCULAR LENS PLACEMENT (IOC);  Surgeon: Fabio Pierce, MD;  Location: [facility] ORS;  Service: Ophthalmology;  Laterality: Left;  CDE 14.63   CATARACT EXTRACTION W/PHACO Right 02/03/2022   Procedure: CATARACT EXTRACTION PHACO AND INTRAOCULAR LENS PLACEMENT (IOC);  Surgeon: Fabio Pierce, MD;  Location: [facility] ORS;  Service: Ophthalmology;  Laterality: Right;  CDE 11.94   COLONOSCOPY N/A 01/22/2017   Dr. Darrick Penna: 6 tubular adenomas removed, external hemorrhoids.  Recommended next exam in 3 years.   COLONOSCOPY WITH PROPOFOL N/A 03/02/2020   Surgeon: Earnest Bailey K, DO; Nonbleeding internal hemorrhoids, multiple diverticula in the sigmoid, descending, transverse colon, 2 polyps 4 to 6 mm in size and one 2 mm polyp.  Pathology with tubular adenoma and hyperplastic polyp. Recommended repeat in 5 years.   CORONARY ANGIOPLASTY WITH STENT PLACEMENT  2006   "2"   CORONARY PRESSURE/FFR STUDY N/A 06/08/2017   Procedure: INTRAVASCULAR PRESSURE WIRE/FFR STUDY;  Surgeon: Yvonne Kendall, MD;  [facility]: [facility] INVASIVE CV LAB;  Service: Cardiovascular;  Laterality: N/A;   CYSTOSCOPY W/  STONE MANIPULATION  ~ 2008   ESOPHAGOGASTRODUODENOSCOPY N/A 01/22/2017   Dr. Darrick Penna: Mild to moderate gastritis.  No H. pylori   ESOPHAGOGASTRODUODENOSCOPY N/A 05/31/2018   Procedure: ESOPHAGOGASTRODUODENOSCOPY (EGD);  Surgeon: West Bali, MD;  normal examined esophagus, gastritis due to ASA, and normal examined duodenum.  No obvious source for melena.    FRACTURE SURGERY     LEFT HEART CATH AND CORONARY ANGIOGRAPHY N/A 06/08/2017   Procedure: LEFT HEART CATH AND  CORONARY ANGIOGRAPHY;  Surgeon: Yvonne Kendall, MD;  [facility]: [facility] INVASIVE CV LAB;  Service: Cardiovascular;  Laterality: N/A;   LEFT HEART CATH AND CORONARY ANGIOGRAPHY N/A 06/05/2018   Procedure: LEFT HEART CATH AND CORONARY ANGIOGRAPHY;  Surgeon: Lyn Records, MD;  [facility]: [facility] INVASIVE CV LAB;  Service: Cardiovascular;  Laterality: N/A;   LEFT HEART CATH AND CORONARY ANGIOGRAPHY N/A 06/16/2022   Procedure: LEFT HEART CATH AND CORONARY ANGIOGRAPHY;  Surgeon: Marykay Lex, MD;  Location: [facility] INVASIVE CV LAB;  Service: Cardiovascular;  Laterality: N/A;   LEFT HEART CATHETERIZATION WITH CORONARY ANGIOGRAM N/A 04/09/2014   Procedure: LEFT HEART CATHETERIZATION WITH CORONARY ANGIOGRAM;  Surgeon: Marykay Lex, MD;  Location: [facility] CATH LAB;  Service: Cardiovascular;  Laterality: N/A;   NASAL SEPTUM SURGERY  1999   POLYPECTOMY  01/22/2017   Procedure: POLYPECTOMY;  Surgeon: West Bali, MD;  [facility] ENDO [facility];  Service: Endoscopy;;  right colon x4   POLYPECTOMY  03/02/2020   Procedure: POLYPECTOMY;  Surgeon: Lanelle Bal, DO;  [facility] ENDO [facility];  Service: Endoscopy;;   TEE WITHOUT CARDIOVERSION N/A 06/07/2018   Procedure: TRANSESOPHAGEAL ECHOCARDIOGRAM (TEE);  Surgeon: Dolores Patty, MD;  Location: [facility] ENDOSCOPY;  Service: Cardiovascular;  Laterality: N/A;     Inpatient Medications: Scheduled Meds:  aspirin  81 mg Oral Once   [START ON 09/20/2022] atorvastatin  80 mg Oral Daily   dexamethasone (DECADRON) injection  6 mg Intravenous Daily   doxazosin  8 mg Oral QHS   furosemide  80 mg Intravenous Q12H   ipratropium-albuterol  3 mL Nebulization Q4H   pantoprazole (PROTONIX) IV  40 mg Intravenous Q12H   Continuous Infusions:  ceFEPime (MAXIPIME) IV 2 g (10/04/2022 1219)   linezolid (ZYVOX) IV     tocilizumab (ACTEMRA) - non-COVID treatment     PRN Meds:   Allergies:    Allergies  Allergen Reactions   Vancomycin Rash    Social History:   Social History    Socioeconomic History   Marital status: Married    Spouse name: Not on file   Number of children: Not on file   Years of education: Not on file   Highest education level: Not on file  Occupational History    Comment: Retired  Tobacco Use   Smoking status: Former    Current packs/day: 0.00    Average packs/day: 1.5 packs/day for 22.0 years (33.0 ttl pk-yrs)    Types: Cigarettes    Start date: 11/28/1942    Quit date: 02/07/1964    Years since quitting: 58.6   Smokeless tobacco: Never  Vaping Use   Vaping status: Never Used  Substance and Sexual Activity   Alcohol use: No    Alcohol/week: 0.0 standard drinks of alcohol   Drug use: No   Sexual activity: Yes  Other Topics Concern   Not on file  Social History Narrative   Not on file   Social Determinants of Health   Financial Resource Strain: Medium Risk (09/25/2022)   Received  from [facility]   Overall Financial Resource Strain (CARDIA)    Difficulty of Paying Living Expenses: Somewhat hard  Food Insecurity: No Food Insecurity (09/24/2022)   [facility]   Hunger Vital Sign    Worried About Running Out of Food in the Last Year: Never true    Ran Out of Food in the Last Year: Never true  Transportation Needs: No Transportation Needs (09/24/2022)   Received from [facility] - Transportation    Lack of Transportation (Medical): No    Lack of Transportation (Non-Medical): No  Physical Activity: Insufficiently Active (09/24/2022)   Received from [facility]   Exercise Vital Sign    Days of Exercise per Week: 2 days    Minutes of Exercise per Session: 10 min  Stress: Stress Concern Present (09/24/2022)   Received from [facility] - Occupational Stress Questionnaire    Feeling of Stress : Very much  Social Connections: Socially Integrated (09/24/2022)   Received from [facility]   Social Connection and Isolation Panel [NHANES]     Frequency of Communication with Friends and Family: More than three times a week    Frequency of Social Gatherings with Friends and Family: Three times a week    Attends Religious Services: More than 4 times per year    Active Member of Clubs or Organizations: Yes    Attends Banker Meetings: More than 4 times per year    Marital Status: Married  Catering manager Violence: Not At Risk (09/25/2022)   Received from [facility]   Humiliation, Afraid, Rape, and Kick questionnaire    Fear of Current or Ex-Partner: No    Emotionally Abused: No    Physically Abused: No    Sexually Abused: No    Family History:    Family History  Problem Relation Age of Onset   Heart disease Mother    Colon cancer Neg Hx    Gastric cancer Neg Hx    Esophageal cancer Neg Hx      ROS:  Please see the history of present illness.   All other ROS reviewed and negative.     Physical Exam/Data:   Vitals:   09/14/2022 1038 09/13/2022 1100 09/16/2022 1137 09/29/2022 1200  BP: 135/65 131/66  121/67  Pulse:  (!) 116  (!) 107  Resp: (!) 38 (!) 32  (!) 27  Temp: 98.5 F (36.9 C)     TempSrc: Axillary     SpO2:  91% 97% 96%   No intake or output data in the 24 hours ending 09/30/22 1358    07/11/2022    8:53 AM 06/21/2022    4:01 PM 06/20/2022    8:06 AM  Last 3 Weights  Weight (lbs) 166 lb 168 lb 12.8 oz 168 lb 12.8 oz  Weight (kg) 75.297 kg 76.567 kg 76.567 kg     There is no height or weight on file to calculate BMI.  General:  ill appearing, increased WOB HEENT: normal Cardiac: Tachycardic, irregular, no murmurs Lungs: Diffuse wheezing, increased work of breathing Abd: soft, nontender Ext: Trace edema Neuro: Oriented to person and place Psych: Unable to assess  EKG:  The EKG was personally reviewed and demonstrates: Atrial fibrillation, rate 120, Q waves in leads III, aVF Telemetry:  Telemetry was personally reviewed and demonstrates: Atrial fibrillation rate 100s to 120s  Relevant  CV Studies:  Laboratory Data:  High Sensitivity Troponin:  No results for input(s): "TROPONINIHS" in the last 720 hours.   ChemistryNo results for input(s): "NA", "K", "CL", "CO2", "GLUCOSE", "BUN", "CREATININE", "CALCIUM", "MG", "GFRNONAA", "GFRAA", "ANIONGAP" in the last 168 hours.  No results for input(s): "PROT", "ALBUMIN", "AST", "ALT", "ALKPHOS", "BILITOT" in the last 168 hours. Lipids No results for input(s): "CHOL", "TRIG", "HDL", "LABVLDL", "LDLCALC", "CHOLHDL" in the last 168 hours.  HematologyNo results for input(s): "WBC", "RBC", "HGB", "HCT", "MCV", "MCH", "MCHC", "RDW", "PLT" in the last 168 hours. Thyroid No results for input(s): "TSH", "FREET4" in the last 168 hours.  BNPNo results for input(s): "BNP", "PROBNP" in the last 168 hours.  DDimer No results for input(s): "DDIMER" in the last 168 hours.   Radiology/Studies:  DG CHEST PORT 1 VIEW  Result Date: 10/07/2022 CLINICAL DATA:  Respiratory failure, hypoxia EXAM: PORTABLE CHEST 1 VIEW COMPARISON:  09/28/2022 FINDINGS: Cardiomegaly. Unchanged, very extensive heterogeneous and consolidative airspace opacity throughout the lungs. Osseous structures unremarkable. IMPRESSION: 1. Unchanged, very extensive heterogeneous and consolidative airspace opacity throughout the lungs, consistent with edema, infection, and/or ARDS. 2. Cardiomegaly. Electronically Signed   By: Jearld Lesch M.D.   On: 09/17/2022 12:21     Assessment and Plan:   Elevated troponin: Troponin 1050 > 1524.  Suspect demand ischemia in setting of respiratory disease.  Not a candidate for invasive evaluation currently with respiratory status, AKI, anemia and hemoptysis.  EKG does show new inferior Q waves.  Check echocardiogram.  Would hold off on anticoagulation in setting of hemoptysis  CAD: History of PCI to LAD.  Cath 06/16/2022 showed nonobstructive CAD (20% mid LAD stenosis, 60% ostial diagonal, patent LAD stent).  Holding aspirin given hemoptysis as above,  would restart once able  Permanent atrial fibrillation: Holding anticoagulation given hemoptysis.  Rates have been elevated, will add metoprolol 25 mg twice daily  Acute hypoxic respiratory failure: Due to COVID-19 pneumonia, possible superimposed bacterial pneumonia.  Also with underlying IPF.  Pulmonology following/may be component of heart failure contributing, he has been started on diuresis.  Will follow-up echocardiogram.  Anemia: Baseline hemoglobin 12-13, currently 9.1.  Has been having hemoptysis.  Will monitor, holding anticoagulation  AKI: Creatinine 1.59, will monitor with diuresis  For questions or updates, please contact [facility] Please consult [email or web address] for contact info under    Signed, Little Ishikawa, MD  09/22/2022 1:58 PM

## 2022-10-01 DIAGNOSIS — I5033 Acute on chronic diastolic (congestive) heart failure: Secondary | ICD-10-CM | POA: Insufficient documentation

## 2022-10-01 DIAGNOSIS — J9621 Acute and chronic respiratory failure with hypoxia: Secondary | ICD-10-CM

## 2022-10-01 DIAGNOSIS — E871 Hypo-osmolality and hyponatremia: Secondary | ICD-10-CM | POA: Insufficient documentation

## 2022-10-01 DIAGNOSIS — R042 Hemoptysis: Secondary | ICD-10-CM | POA: Insufficient documentation

## 2022-10-01 LAB — CBC
HCT: 25.1 % — ABNORMAL LOW (ref 39.0–52.0)
Hemoglobin: 8.7 g/dL — ABNORMAL LOW (ref 13.0–17.0)
MCH: 31.5 pg (ref 26.0–34.0)
MCHC: 34.7 g/dL (ref 30.0–36.0)
MCV: 90.9 fL (ref 80.0–100.0)
Platelets: 163 10*3/uL (ref 150–400)
RBC: 2.76 MIL/uL — ABNORMAL LOW (ref 4.22–5.81)
RDW: 14.9 % (ref 11.5–15.5)
WBC: 18.7 10*3/uL — ABNORMAL HIGH (ref 4.0–10.5)
nRBC: 0.2 % (ref 0.0–0.2)

## 2022-10-01 LAB — COMPREHENSIVE METABOLIC PANEL
ALT: 29 U/L (ref 0–44)
AST: 77 U/L — ABNORMAL HIGH (ref 15–41)
Albumin: 2.3 g/dL — ABNORMAL LOW (ref 3.5–5.0)
Alkaline Phosphatase: 114 U/L (ref 38–126)
Anion gap: 11 (ref 5–15)
BUN: 42 mg/dL — ABNORMAL HIGH (ref 8–23)
CO2: 23 mmol/L (ref 22–32)
Calcium: 7.9 mg/dL — ABNORMAL LOW (ref 8.9–10.3)
Chloride: 99 mmol/L (ref 98–111)
Creatinine, Ser: 1.9 mg/dL — ABNORMAL HIGH (ref 0.61–1.24)
GFR, Estimated: 35 mL/min — ABNORMAL LOW (ref >60)
Glucose, Bld: 150 mg/dL — ABNORMAL HIGH (ref 70–99)
Potassium: 3.6 mmol/L (ref 3.5–5.1)
Sodium: 133 mmol/L — ABNORMAL LOW (ref 135–145)
Total Bilirubin: 1.5 mg/dL — ABNORMAL HIGH (ref 0.3–1.2)
Total Protein: 5.7 g/dL — ABNORMAL LOW (ref 6.5–8.1)

## 2022-10-01 LAB — ABO/RH: ABO/RH(D): A NEG

## 2022-10-01 LAB — FIBRINOGEN: Fibrinogen: 485 mg/dL — ABNORMAL HIGH (ref 210–475)

## 2022-10-01 LAB — D-DIMER, QUANTITATIVE: D-Dimer, Quant: >20 ug{FEU}/mL — ABNORMAL HIGH (ref 0.00–0.50)

## 2022-10-01 LAB — SEDIMENTATION RATE: Sed Rate: 26 mm/h — ABNORMAL HIGH (ref 0–16)

## 2022-10-01 LAB — C-REACTIVE PROTEIN: CRP: 21.8 mg/dL — ABNORMAL HIGH (ref <1.0)

## 2022-10-01 LAB — TROPONIN I (HIGH SENSITIVITY): Troponin I (High Sensitivity): 7476 ng/L (ref <18)

## 2022-10-01 LAB — PROCALCITONIN: Procalcitonin: 0.42 ng/mL

## 2022-10-01 MED ORDER — ONDANSETRON 4 MG PO TBDP: 4.0000 mg | ORAL_TABLET | Freq: Four times a day (QID) | ORAL | Status: DC | PRN

## 2022-10-01 MED ORDER — LORAZEPAM 1 MG PO TABS: 1.0000 mg | ORAL_TABLET | ORAL | Status: DC | PRN

## 2022-10-01 MED ORDER — IPRATROPIUM-ALBUTEROL 0.5-2.5 (3) MG/3ML IN SOLN: 3.0000 mL | Freq: Four times a day (QID) | RESPIRATORY_TRACT | Status: DC

## 2022-10-01 MED ORDER — BIOTENE DRY MOUTH MT LIQD: 15.0000 mL | OROMUCOSAL | Status: DC | PRN

## 2022-10-01 MED ORDER — LORAZEPAM 2 MG/ML IJ SOLN
1.0000 mg | INTRAMUSCULAR | Status: DC | PRN
  Administered 2022-10-01: 1 mg via INTRAVENOUS
  Filled 2022-10-01: qty 1

## 2022-10-01 MED ORDER — ONDANSETRON HCL 4 MG/2ML IJ SOLN: 4.0000 mg | Freq: Four times a day (QID) | INTRAMUSCULAR | Status: DC | PRN

## 2022-10-01 MED ORDER — LORAZEPAM 2 MG/ML PO CONC: 1.0000 mg | ORAL | Status: DC | PRN

## 2022-10-01 MED ORDER — MORPHINE SULFATE (PF) 2 MG/ML IV SOLN
1.0000 mg | INTRAVENOUS | Status: DC | PRN
  Administered 2022-10-01: 1 mg via INTRAVENOUS
  Filled 2022-10-01: qty 1

## 2022-10-02 LAB — LEGIONELLA PNEUMOPHILA SEROGP 1 UR AG: L. pneumophila Serogp 1 Ur Ag: NEGATIVE

## 2022-10-06 DIAGNOSIS — J1282 Pneumonia due to coronavirus disease 2019: Secondary | ICD-10-CM | POA: Diagnosis not present

## 2022-10-06 DIAGNOSIS — U071 COVID-19: Secondary | ICD-10-CM | POA: Diagnosis not present

## 2022-10-08 NOTE — Assessment & Plan Note (Signed)
CKD IIIa baseline 1.1-1.3.  Cr here up to 1.9 today.

## 2022-10-08 NOTE — Death Summary Note (Signed)
DEATH SUMMARY   Patient Details  Name: Eric Benitez MRN: [id number] DOB: [birth year] WUJ:[id number], Eric Critchley, MD Admission/Discharge Information   Admit Date:  2022-10-09  Date of Death: Date of Death: 10-10-22  Time of Death: Time of Death: 1228-10-16  Length of Stay: 1   Principle Cause of death: COVID  Hospital Diagnoses: Principal Problem:   Acute on chronic respiratory failure with hypoxia due to COVID pneumonia [facility]) Active Problems:   Acute renal failure (ARF) (HCC)   Acute on chronic diastolic CHF (congestive heart failure) (HCC)   Demand ischemia   Coronary artery disease involving native coronary artery of native heart   Essential hypertension   Hyperlipidemia with target LDL less than 70   Normocytic anemia   IPF (idiopathic pulmonary fibrosis) (HCC)   Chronic atrial fibrillation (HCC)   Hemoptysis   Hyponatremia   Hospital Course: Eric Benitez is an 82 y.o. M with IPF not on home O2, CAD remote PCI, HTN, Aflutter on Eliquis, dCHF, and CKD IIIa baseline 1.1-1.3 who was admitted to OSH 8/18 for COVID, deteriorated there to require NRB and was transferred here after 6 days for NSTEMI work up.   He was initially admitted to [facility], where he was stable on 2L O2 but slow to improve.  He did have hemoptysis and his Eliquis was stopped.  On 8/22, he developed worsening respiratory failure and was transferred to the ICU.   There, he was found to have new troponin elevation and so was transferred to [facility] for Cardiology evaluation.    At [facility], Cardiology and Critical Care were consulted.  He was started on tocilizumab and dexamethasone for worsening COVID pneumonitis.  Heparin and aspirin were deferred given hemoptysis after discussion with family.    Despite aggressive care with anti-inflammatories, antibiotics, diuretics, his respiratory failure worsened and patient and family elected to transition to comfort measures and he passed with family at bedside.            The results of significant diagnostics from this hospitalization (including imaging, microbiology, ancillary and laboratory) are listed below for reference.   Significant Diagnostic Studies: US RENAL  Result Date: 2022-10-09 CLINICAL DATA:  Acute kidney injury EXAM: RENAL / URINARY TRACT ULTRASOUND COMPLETE COMPARISON:  CT 09/24/2022 and older FINDINGS: Right Kidney: Renal measurements: 12.0 x 5.0 x 5.7 cm = volume: 176.2 mL. Echogenicity within normal limits. No mass or hydronephrosis visualized. 8 mm anechoic structure along the midportion of the right kidney with through transmission consistent with a benign cysts Left Kidney: Renal measurements: 11.9 x 5.1 x 6.2 cm = volume: 194.1 mL. Echogenicity within normal limits. No mass or hydronephrosis visualized. Bladder: Bladder is contracted. Other: Study limited by overlapping bowel gas and soft tissue. In addition as per the sonographer the patient had difficulty tolerating the procedure. IMPRESSION: No collecting system dilatation.  Contracted urinary bladder. Electronically Signed   By: Karen Kays M.D.   On: October 09, 2022 17:49   DG CHEST PORT 1 VIEW  Result Date: 10/09/22 CLINICAL DATA:  Respiratory failure, hypoxia EXAM: PORTABLE CHEST 1 VIEW COMPARISON:  09/28/2022 FINDINGS: Cardiomegaly. Unchanged, very extensive heterogeneous and consolidative airspace opacity throughout the lungs. Osseous structures unremarkable. IMPRESSION: 1. Unchanged, very extensive heterogeneous and consolidative airspace opacity throughout the lungs, consistent with edema, infection, and/or ARDS. 2. Cardiomegaly. Electronically Signed   By: Jearld Lesch M.D.   On: Oct 09, 2022 12:21    Microbiology: No results found for this or any previous visit (from the past  240 hour(s)).   Signed: Alberteen Sam, MD 09/28/2022

## 2022-10-08 NOTE — Hospital Course (Signed)
Mr. Zeitz is an 82 y.o. M with IPF not on home O2, CAD remote PCI, HTN, Aflutter on Eliquis, dCHF, and CKD IIIa baseline 1.1-1.3 who was admitted to OSH 8/18 for COVID, deteriorated there to require NRB and was transferred here after 6 days for NSTEMI work up.     Significant events: 8/18: Admitted to OSH 8/20: Cholelithiasis noted on imaging; Gen Surg consulted 8/22: O2 needs up from 2L->NRB, transferred to ICU, developed chest pain, troponins elevated 8/23: Transferred to [facility], now requiring HFNC and NRB; DNR confirmed, Pulm and Cardiology consulted; Toci given, steroids started   Significant studies: 8/18 CT abdomen/pelvis at OSH: gallstones, trace bilateral pleural effusions 8/18 CXR at OSH: bilateral pneumonia 8/20 US abdomen OSH: cholelithiasis 8/21 [facility] OSH: normal 8/22 CXR at OSH repeat: Progressive pneumonia 8/23 CXR on admission here: Extensive consolidation bilaterally 8/23 Renal US: unremarkable   Significant microbiology data: 8/18 COVID + 8/18 blood cx x2 at OSH: NG    Procedures: None    Consults: CCM Cardiology Palliative Care

## 2022-10-08 NOTE — Plan of Care (Signed)
  Problem: Education: Goal: Knowledge of General Education information will improve Description: Including pain rating scale, medication(s)/side effects and non-pharmacologic comfort measures Outcome: Not Progressing   

## 2022-10-08 NOTE — Care Plan (Signed)
Diuretics yesterday given with no improvement in respiratory status.  Renal function worse.  Troponins rising.  Inflammatory markers trending up.   - Consult Palliative Care

## 2022-10-08 NOTE — Progress Notes (Signed)
Progress Note   Patient: Eric Benitez ZOX:[id number] DOB: [birth year] DOA: 10/02/2022     1 DOS: the patient was seen and examined on 09/25/2022 at 8:45AM      Brief hospital course: Eric Benitez is an 82 y.o. M with IPF not on home O2, CAD remote PCI, HTN, Aflutter on Eliquis, dCHF, and CKD IIIa baseline 1.1-1.3 who was admitted to OSH 8/18 for COVID, deteriorated there to require NRB and was transferred here after 6 days for NSTEMI work up.     Significant events: 8/18: Admitted to OSH 8/20: Cholelithiasis noted on imaging; Gen Surg consulted 8/22: O2 needs up from 2L->NRB, transferred to ICU, developed chest pain, troponins elevated 8/23: Transferred to [facility], now requiring HFNC and NRB; DNR confirmed, Pulm and Cardiology consulted; Toci given, steroids started   Significant studies: 8/18 CT abdomen/pelvis at OSH: gallstones, trace bilateral pleural effusions 8/18 CXR at OSH: bilateral pneumonia 8/20 US abdomen OSH: cholelithiasis 8/21 [facility] OSH: normal 8/22 CXR at OSH repeat: Progressive pneumonia 8/23 CXR on admission here: Extensive consolidation bilaterally 8/23 Renal US: unremarkable   Significant microbiology data: 8/18 COVID + 8/18 blood cx x2 at OSH: NG    Procedures: None    Consults: CCM Cardiology Palliative Care      Assessment and Plan:  Principal Problem:   Acute on chronic respiratory failure with hypoxia due to COVID pneumonia (HCC) Active Problems:   Acute renal failure (ARF) (HCC)   Acute on chronic diastolic CHF (congestive heart failure) (HCC)   Demand ischemia   Coronary artery disease involving native coronary artery of native heart   Essential hypertension   Hyperlipidemia with target LDL less than 70   Normocytic anemia   IPF (idiopathic pulmonary fibrosis) (HCC)   Chronic atrial fibrillation (HCC)   Hemoptysis   Hyponatremia   CKD IIIa baseline 1.1-1.3.  Cr here up to 1.9 today.  Troponin trending up.  Overnight patient  asking to "go home".  Family at bedside.  I agree, appropriate and aggressive measures were taken to address all potential contributors to his respiratory failure.  The main driver however appears to be extensive inflammatory lung COVID in setting of his underlying IPF and age.  I think there is a high degree of certainty this is terminal regardless of treatment.   -Comfort measures - Stop antibiotics - Continue steroids palliatively - Continue metoprolol and pantoprazole - IV morphine for dyspnea, Ativan for anxiety - Avoid IV fluids - Hold for comfort measures          Subjective: Patient had a bad night last night, denies chest pain to be, however he appears to be worsening and is in significant respiratory discomfort.     Physical Exam: BP 135/70 (BP Location: Left Arm)   Pulse (!) 108   Temp 99.6 F (37.6 C) (Axillary)   Resp (!) 25   Ht 5\' 7"  (1.702 m)   Wt 79.3 kg   SpO2 95%   BMI 27.38 kg/m   Elderly adult male, lying in bed, eyes closed, responds [place] but difficult to interpret what he is saying Tachycardic, irregular, I do not appreciate murmurs, he has no distal edema Respiratory rate fast, shallow, diminished, I do not appreciate rales Abdomen soft, no guarding, no obvious tenderness Attention diminished, affect blunted, speech hoarse, but seems to respond appropriately, just limited by respiratory discomfort    Data Reviewed: Basic metabolic panel shows worsening renal function Inflammatory markers worsening Troponin trending up Case discussed with cardiology  and palliative care  Family Communication: Wife and daughters at the bedside    Disposition: Status is: Inpatient Patient was admitted with COVID to the outside hospital, he rapidly deteriorated 2 days ago and was transferred here.  Following aggressive measures were taken to reverse his disease.  This appears to be terminal, in discussion with family we will provide all comfort  measures        Author: Alberteen Sam, MD 09/29/2022 9:07 AM  For on call review [email or web address].

## 2022-10-08 NOTE — Progress Notes (Signed)
Patient placed on 3L [place] for comfort care.

## 2022-10-08 NOTE — Progress Notes (Signed)
     Referral received for Kacin D Vanwyhe: goals of care discussion. Chart reviewed and updates received from Dr. Maryfrances Bunnell. The patient has transitioned to full comfort care. Consult visit is not needed at this time, but he will reach out with any symptom management needs. Will keep patient on our list.  Thank you for your referral and allowing PMT to assist in Decota Rudge Hustead's care.   Sarina Ser, NP Palliative Medicine Team  Team Phone # [phone]   NO CHARGE

## 2022-10-08 DEATH — deceased

## 2022-10-10 ENCOUNTER — Inpatient Hospital Stay: Payer: PPO | Admitting: Pulmonary Disease

## 2022-10-16 ENCOUNTER — Inpatient Hospital Stay: Payer: PPO | Admitting: Internal Medicine

## 2022-11-13 ENCOUNTER — Ambulatory Visit: Payer: PPO | Admitting: Nurse Practitioner

## 2022-12-20 ENCOUNTER — Ambulatory Visit (HOSPITAL_BASED_OUTPATIENT_CLINIC_OR_DEPARTMENT_OTHER): Payer: PPO | Admitting: Pulmonary Disease
